# Patient Record
Sex: Female | Born: 1935
Health system: Southern US, Community
[De-identification: ages and names within clinical notes are randomized; demographics above are authoritative.]

## PROBLEM LIST (undated history)

## (undated) DIAGNOSIS — R143 Flatulence: Secondary | ICD-10-CM

## (undated) DIAGNOSIS — R142 Eructation: Secondary | ICD-10-CM

## (undated) DIAGNOSIS — S43005A Unspecified dislocation of left shoulder joint, initial encounter: Secondary | ICD-10-CM

## (undated) DIAGNOSIS — K649 Unspecified hemorrhoids: Secondary | ICD-10-CM

## (undated) DIAGNOSIS — K921 Melena: Secondary | ICD-10-CM

## (undated) DIAGNOSIS — R141 Gas pain: Secondary | ICD-10-CM

## (undated) DIAGNOSIS — F0390 Unspecified dementia without behavioral disturbance: Secondary | ICD-10-CM

## (undated) DIAGNOSIS — M1990 Unspecified osteoarthritis, unspecified site: Secondary | ICD-10-CM

## (undated) DIAGNOSIS — K219 Gastro-esophageal reflux disease without esophagitis: Secondary | ICD-10-CM

## (undated) HISTORY — DX: Unspecified dementia, unspecified severity, without behavioral disturbance, psychotic disturbance, mood disturbance, and anxiety: F03.90

## (undated) HISTORY — DX: Gas pain: R14.2

## (undated) HISTORY — DX: Gastro-esophageal reflux disease without esophagitis: K21.9

## (undated) HISTORY — DX: Unspecified osteoarthritis, unspecified site: M19.90

## (undated) HISTORY — DX: Unspecified hemorrhoids: K64.9

## (undated) HISTORY — DX: Gas pain: R14.3

## (undated) HISTORY — DX: Unspecified dislocation of left shoulder joint, initial encounter: S43.005A

## (undated) HISTORY — DX: Melena: K92.1

## (undated) HISTORY — DX: Flatulence: R14.1

## (undated) HISTORY — PX: ABDOMINAL HYSTERECTOMY: SUR658

---

## 2000-04-07 ENCOUNTER — Other Ambulatory Visit: Admission: RE | Admit: 2000-04-07 | Discharge: 2000-04-07 | Payer: Self-pay | Admitting: Emergency Medicine

## 2000-04-11 ENCOUNTER — Encounter: Payer: Self-pay | Admitting: Emergency Medicine

## 2000-04-11 ENCOUNTER — Encounter: Admission: RE | Admit: 2000-04-11 | Discharge: 2000-04-11 | Payer: Self-pay | Admitting: Emergency Medicine

## 2000-12-13 ENCOUNTER — Encounter: Admission: RE | Admit: 2000-12-13 | Discharge: 2000-12-13 | Payer: Self-pay | Admitting: Emergency Medicine

## 2000-12-13 ENCOUNTER — Encounter: Payer: Self-pay | Admitting: Emergency Medicine

## 2001-03-06 ENCOUNTER — Emergency Department (HOSPITAL_COMMUNITY): Admission: EM | Admit: 2001-03-06 | Discharge: 2001-03-06 | Payer: Self-pay | Admitting: Emergency Medicine

## 2001-03-27 ENCOUNTER — Inpatient Hospital Stay (HOSPITAL_COMMUNITY): Admission: AD | Admit: 2001-03-27 | Discharge: 2001-03-27 | Payer: Self-pay | Admitting: *Deleted

## 2001-04-12 ENCOUNTER — Encounter: Payer: Self-pay | Admitting: Emergency Medicine

## 2001-04-12 ENCOUNTER — Encounter: Admission: RE | Admit: 2001-04-12 | Discharge: 2001-04-12 | Payer: Self-pay | Admitting: Emergency Medicine

## 2002-10-11 ENCOUNTER — Encounter: Admission: RE | Admit: 2002-10-11 | Discharge: 2002-10-11 | Payer: Self-pay | Admitting: Emergency Medicine

## 2002-10-11 ENCOUNTER — Encounter: Payer: Self-pay | Admitting: Emergency Medicine

## 2009-10-12 ENCOUNTER — Encounter: Admission: RE | Admit: 2009-10-12 | Discharge: 2009-10-12 | Payer: Self-pay | Admitting: Obstetrics and Gynecology

## 2009-12-22 LAB — HM PAP SMEAR: HM Pap smear: NORMAL

## 2009-12-22 LAB — CONVERTED CEMR LAB: Pap Smear: NORMAL

## 2010-07-26 ENCOUNTER — Ambulatory Visit: Payer: Self-pay | Admitting: Internal Medicine

## 2010-07-26 ENCOUNTER — Encounter: Payer: Self-pay | Admitting: Gastroenterology

## 2010-07-26 DIAGNOSIS — K921 Melena: Secondary | ICD-10-CM | POA: Insufficient documentation

## 2010-07-26 DIAGNOSIS — K219 Gastro-esophageal reflux disease without esophagitis: Secondary | ICD-10-CM | POA: Insufficient documentation

## 2010-07-26 LAB — CONVERTED CEMR LAB
Basophils Absolute: 0 10*3/uL (ref 0.0–0.1)
CO2: 30 meq/L (ref 19–32)
Calcium: 9.4 mg/dL (ref 8.4–10.5)
Creatinine, Ser: 0.8 mg/dL (ref 0.4–1.2)
Eosinophils Absolute: 0.1 10*3/uL (ref 0.0–0.7)
GFR calc non Af Amer: 89.07 mL/min (ref >60)
Glucose, Bld: 80 mg/dL (ref 70–99)
Hemoglobin: 13.6 g/dL (ref 12.0–15.0)
Lymphocytes Relative: 40.8 % (ref 12.0–46.0)
MCHC: 35.3 g/dL (ref 30.0–36.0)
Monocytes Relative: 8.8 % (ref 3.0–12.0)
Neutro Abs: 2.1 10*3/uL (ref 1.4–7.7)
Neutrophils Relative %: 48.1 % (ref 43.0–77.0)
RBC: 4.45 M/uL (ref 3.87–5.11)
RDW: 13.9 % (ref 11.5–14.6)
Sodium: 140 meq/L (ref 135–145)

## 2010-07-27 ENCOUNTER — Encounter: Payer: Self-pay | Admitting: Internal Medicine

## 2010-08-24 ENCOUNTER — Ambulatory Visit: Payer: Self-pay | Admitting: Gastroenterology

## 2010-08-29 ENCOUNTER — Encounter: Payer: Self-pay | Admitting: Internal Medicine

## 2010-08-30 ENCOUNTER — Telehealth: Payer: Self-pay | Admitting: Internal Medicine

## 2010-09-21 ENCOUNTER — Encounter (INDEPENDENT_AMBULATORY_CARE_PROVIDER_SITE_OTHER): Payer: Self-pay | Admitting: *Deleted

## 2010-09-21 ENCOUNTER — Ambulatory Visit: Payer: Self-pay | Admitting: Internal Medicine

## 2010-10-26 ENCOUNTER — Ambulatory Visit: Payer: Self-pay | Admitting: Gastroenterology

## 2010-10-26 ENCOUNTER — Encounter (INDEPENDENT_AMBULATORY_CARE_PROVIDER_SITE_OTHER): Payer: Self-pay | Admitting: *Deleted

## 2010-10-26 DIAGNOSIS — R143 Flatulence: Secondary | ICD-10-CM

## 2010-10-26 DIAGNOSIS — R142 Eructation: Secondary | ICD-10-CM

## 2010-10-26 DIAGNOSIS — K625 Hemorrhage of anus and rectum: Secondary | ICD-10-CM | POA: Insufficient documentation

## 2010-10-26 DIAGNOSIS — K649 Unspecified hemorrhoids: Secondary | ICD-10-CM | POA: Insufficient documentation

## 2010-10-26 DIAGNOSIS — R141 Gas pain: Secondary | ICD-10-CM | POA: Insufficient documentation

## 2010-10-26 DIAGNOSIS — M1990 Unspecified osteoarthritis, unspecified site: Secondary | ICD-10-CM | POA: Insufficient documentation

## 2010-11-11 HISTORY — PX: COLONOSCOPY: SHX174

## 2010-11-11 HISTORY — PX: UPPER GI ENDOSCOPY: SHX6162

## 2010-11-22 ENCOUNTER — Ambulatory Visit: Payer: Self-pay | Admitting: Gastroenterology

## 2010-11-22 LAB — CONVERTED CEMR LAB: UREASE: NEGATIVE

## 2011-01-02 ENCOUNTER — Encounter: Payer: Self-pay | Admitting: Emergency Medicine

## 2011-01-02 ENCOUNTER — Encounter: Payer: Self-pay | Admitting: Internal Medicine

## 2011-01-11 NOTE — Letter (Signed)
Summary: Results Follow-up Letter  [facility]  [address] [place], [place] [address]   Phone: [phone]  Fax: [phone]    07/27/2010  [address] [place], [place]  [address]  Dear Ms. Kracht,   The following are the results of your recent test(s):  Test     Result     hand xray     arthritis CBC       normal Kidney     normal   _________________________________________________________  Please call for an appointment in 3-4 weeks _________________________________________________________ _________________________________________________________ _________________________________________________________  Sincerely,  Sanda Linger MD [facility]

## 2011-01-11 NOTE — Miscellaneous (Signed)
Summary: Flu Vaccination/[facility]  Flu Vaccination/[facility]   Imported By: Sherian Rein 09/01/2010 08:02:41  _____________________________________________________________________  External Attachment:    Type:   Image     Comment:   External Document

## 2011-01-11 NOTE — Letter (Signed)
Summary: [facility] Instructions  [facility]  [address] [place], [place] [address]   Phone: [phone]  Fax: [phone]       Katelyn Sparks    1935-11-09    MRN: [id number]        Procedure Day /Date: 11/22/2010 Monday     Arrival Time: 1:30pm     Procedure Time: 2:30pm     Location of Procedure:                    X  [facility] (4th Floor)   PREPARATION FOR COLONOSCOPY WITH MOVIPREP   Starting 5 days prior to your procedure 11/17/2010 do not eat nuts, seeds, popcorn, corn, beans, peas,  salads, or any raw vegetables.  Do not take any fiber supplements (e.g. Metamucil, Citrucel, and Benefiber).  THE DAY BEFORE YOUR PROCEDURE         DATE: 11/21/2010  DAY: Sunday  1.  Drink clear liquids the entire day-NO SOLID FOOD  2.  Do not drink anything colored red or purple.  Avoid juices with pulp.  No orange juice.  3.  Drink at least 64 oz. (8 glasses) of fluid/clear liquids during the day to prevent dehydration and help the prep work efficiently.  CLEAR LIQUIDS INCLUDE: Water Jello Ice Popsicles Tea (sugar ok, no milk/cream) Powdered fruit flavored drinks Coffee (sugar ok, no milk/cream) Gatorade Juice: apple, white grape, white cranberry  Lemonade Clear bullion, consomm, broth Carbonated beverages (any kind) Strained chicken noodle soup Hard Candy                             4.  In the morning, mix first dose of MoviPrep solution:    Empty 1 Pouch A and 1 Pouch B into the disposable container    Add lukewarm drinking water to the top line of the container. Mix to dissolve    Refrigerate (mixed solution should be used within 24 hrs)  5.  Begin drinking the prep at 5:00 p.m. The MoviPrep container is divided by 4 marks.   Every 15 minutes drink the solution down to the next mark (approximately 8 oz) until the full liter is complete.   6.  Follow completed prep with 16 oz of clear liquid of your choice (Nothing red or purple).   Continue to drink clear liquids until bedtime.  7.  Before going to bed, mix second dose of MoviPrep solution:    Empty 1 Pouch A and 1 Pouch B into the disposable container    Add lukewarm drinking water to the top line of the container. Mix to dissolve    Refrigerate  THE DAY OF YOUR PROCEDURE      DATE: 11/22/2010   DAY: Monday  Beginning at 9:30am (5 hours before procedure):         1. Every 15 minutes, drink the solution down to the next mark (approx 8 oz) until the full liter is complete.  2. Follow completed prep with 16 oz. of clear liquid of your choice.    3. You may drink clear liquids until 12:30pm (2 HOURS BEFORE PROCEDURE).   MEDICATION INSTRUCTIONS  Unless otherwise instructed, you should take regular prescription medications with a small sip of water   as early as possible the morning of your procedure.         OTHER INSTRUCTIONS  You will need a responsible adult at least 74 years of  age to accompany you and drive you home.   This person must remain in the waiting room during your procedure.  Wear loose fitting clothing that is easily removed.  Leave jewelry and other valuables at home.  However, you may wish to bring a book to read or  an iPod/MP3 player to listen to music as you wait for your procedure to start.  Remove all body piercing jewelry and leave at home.  Total time from sign-in until discharge is approximately 2-3 hours.  You should go home directly after your procedure and rest.  You can resume normal activities the  day after your procedure.  The day of your procedure you should not:   Drive   Make legal decisions   Operate machinery   Drink alcohol   Return to work  You will receive specific instructions about eating, activities and medications before you leave.    The above instructions have been reviewed and explained to me by   _______________________    I fully understand and can verbalize these instructions  _____________________________ Date _________

## 2011-01-11 NOTE — Assessment & Plan Note (Signed)
Summary: 1 mos f/u #/cd   Vital Signs:  Patient profile:   76 year old female Menstrual status:  hysterectomy Height:      666 inches Weight:      143 pounds O2 Sat:      98 % on Room air Temp:     97 degrees F oral Pulse rate:   65 / minute Pulse rhythm:   regular Resp:     16 per minute BP sitting:   140 / 78  (left arm) Cuff size:   regular  Vitals Entered By: Jarome Lamas (September 21, 2010 9:35 AM)  O2 Flow:  Room air CC: 1 month fl/up/pb, Abdominal Pain     Menstrual Status hysterectomy Last PAP Result Normal   Primary Care Provider:  Etta Grandchild MD  CC:  1 month fl/up/pb and Abdominal Pain.  History of Present Illness: She returns for f/up and she tells me that her heartburn is much better. She takes Aciphex on 1/2 of her days.  Dyspepsia History:      She has no alarm features of dyspepsia including no history of melena, hematochezia, dysphagia, persistent vomiting, or involuntary weight loss > 5%.  There is a prior history of GERD.  The patient does not have a prior history of documented ulcer disease.  The dominant symptom is heartburn or acid reflux.  An H-2 blocker medication is currently being taken.  She notes that the symptoms have improved with the H-2 blocker therapy.  Symptoms have not persisted after 4 weeks of H-2 blocker treatment.  No previous upper endoscopy has been done.     Current Medications (verified): 1)  Aciphex 20 Mg Tbec (Rabeprazole Sodium) .... One By Mouth Once Daily As Needed For Heartburn  Allergies (verified): No Known Drug Allergies  Past History:  Past Medical History: Last updated: 07/26/2010 Unremarkable  Past Surgical History: Last updated: 07/26/2010 Hysterectomy  Family History: Last updated: 07/26/2010 Family History of Arthritis Family History Diabetes 1st degree relative Family History Hypertension  Social History: Last updated: 07/26/2010 Retired Widow/Widower Never Smoked Alcohol use-no Drug  use-no Regular exercise-yes  Risk Factors: Alcohol Use: 0 (07/26/2010) Exercise: yes (07/26/2010)  Risk Factors: Smoking Status: never (07/26/2010)  Family History: Reviewed history from 07/26/2010 and no changes required. Family History of Arthritis Family History Diabetes 1st degree relative Family History Hypertension  Social History: Reviewed history from 07/26/2010 and no changes required. 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    Physical Exam  General:  alert, well-developed, well-nourished, well-hydrated, appropriate dress, normal appearance, healthy-appearing, and cooperative to examination.   Mouth:  Oral mucosa and oropharynx without lesions or exudates.  Teeth in good repair. Neck:  supple, full ROM, no masses, no thyromegaly, no thyroid nodules or tenderness, no JVD, normal carotid upstroke, no carotid bruits, no cervical lymphadenopathy, and no neck tenderness.   Lungs:  normal respiratory effort, no intercostal retractions, no accessory muscle use, normal breath sounds, no dullness, no fremitus, no crackles, and no wheezes.   Heart:  normal rate, regular rhythm, no murmur, no gallop, no rub, and no JVD.   Abdomen:  soft, non-tender, normal bowel sounds, no distention, no masses, no guarding, no rigidity, no rebound tenderness, no abdominal hernia, no inguinal hernia, no  hepatomegaly, and no splenomegaly.   Msk:  normal ROM, no joint tenderness, no joint swelling, no joint warmth, no redness over joints, no joint deformities, no joint instability, no crepitation, and no muscle atrophy.   Pulses:  R and L  carotid,radial,femoral,dorsalis pedis and posterior tibial pulses are full and equal bilaterally Extremities:  No clubbing, cyanosis, edema, or deformity noted with normal full range of motion of all joints.   Neurologic:  No cranial nerve deficits noted. Station and gait are normal. Plantar reflexes are down-going bilaterally. DTRs are symmetrical throughout. Sensory, motor and coordinative functions appear intact. Skin:  turgor normal, color normal, no rashes, no suspicious lesions, no ecchymoses, no petechiae, no purpura, no ulcerations, and no edema.   Cervical Nodes:  no anterior cervical adenopathy and no posterior cervical adenopathy.   Psych:  Cognition and judgment appear intact. Alert and cooperative with normal attention span and concentration. No apparent delusions, illusions, hallucinations   Impression & Recommendations:  Problem # 1:  GERD (ICD-530.81) Assessment Improved  Her updated medication list for this problem includes:    Aciphex 20 Mg Tbec (Rabeprazole sodium) ..... One by mouth once daily as needed for heartburn  Orders: Gastroenterology Referral (GI)  Labs Reviewed: Hgb: 13.6 (07/26/2010)   Hct: 38.5 (07/26/2010)  Problem # 2:  BLOOD IN STOOL (ICD-578.1) Assessment: Unchanged  Orders: Gastroenterology Referral (GI)  Complete Medication List: 1)  Aciphex 20 Mg Tbec (Rabeprazole sodium) .... One by mouth once daily as needed for heartburn  Colorectal Screening:  Current Recommendations:    Colonoscopy recommended: scheduled with G.I.  PAP Screening:    Hx Cervical Dysplasia in last 5 yrs? No    3 normal PAP smears in last 5 yrs? Yes    Last PAP smear:  12/22/2009    Reviewed PAP smear recommendations:  patient defers to GYN provider  Mammogram Screening:    Last Mammogram:  12/12/2009    Reviewed Mammogram recommendations:  mammogram ordered  Osteoporosis Risk Assessment:  Risk Factors for Fracture or Low Bone Density:   Smoking status:        never  Immunization & Chemoprophylaxis:    Influenza vaccine: Fluvax MCR  (08/29/2010)  Patient Instructions: 1)  Please schedule a follow-up appointment in 4 months. 2)  Avoid foods high in acid (tomatoes, citrus juices, spicy foods). Avoid eating within two hours of lying down or before exercising. Do not over eat; try smaller more frequent meals. Elevate head of bed twelve inches when sleeping. 3)  Schedule your mammogram. 4)  Schedule a colonoscopy/sigmoidoscopy to help detect colon cancer. 5)  You need to have a Pap Smear to prevent cervical cancer. Prescriptions: ACIPHEX 20 MG TBEC (RABEPRAZOLE SODIUM) one by mouth once daily as needed for heartburn  #30 x 11   Entered and Authorized by:   Etta Grandchild MD   Signed by:   Etta Grandchild MD on 09/21/2010   Method used:   Electronically to        [facility]* (retail)       [address]       [address] Shipping/mailing       [place], [place]  [address]       Ph: [phone]       Fax: [phone]   RxID:   [id number]

## 2011-01-11 NOTE — Assessment & Plan Note (Signed)
Summary: gerd...as.   History of Present Illness Visit Type: Initial Consult Primary GI MD: Sheryn Bison MD FACP FAGA Primary Provider: Etta Grandchild MD Requesting Provider: Etta Grandchild MD Chief Complaint: BRB in stool after BMs, constipation, hemorrhoids, bloating, chest pain, and GERD  History of Present Illness:   76 year old [place] American female referred by Dr.Jones for evaluation of years of intermittent rectal bleeding, chronic gastroesophageal reflux disease, and recurrent nocturnal chest pain.  This lady has not had previous endoscopy or colonoscopy. She denies abdominal pain, but has rather classic acid reflux symptoms alleviated by taking p.r.n. AcipHex. She has almost daily rectal bleeding and has felt large external hemorrhoids. Recent laboratory data showed a normal CBC a metabolic profile. She denies any food intolerances, anorexia or weight loss. She does not abuse alcohol, cigarettes, or NSAIDs. She has had previous hysterectomy.   GI Review of Systems    Reports acid reflux, bloating, chest pain, and  heartburn.      Denies abdominal pain, belching, dysphagia with liquids, dysphagia with solids, loss of appetite, nausea, vomiting, vomiting blood, weight loss, and  weight gain.      Reports constipation, hemorrhoids, and  rectal bleeding.     Denies anal fissure, black tarry stools, change in bowel habit, diarrhea, diverticulosis, fecal incontinence, heme positive stool, irritable bowel syndrome, jaundice, light color stool, liver problems, and  rectal pain.    Current Medications (verified): 1)  Aciphex 20 Mg Tbec (Rabeprazole Sodium) .... One By Mouth Once Daily As Needed For Heartburn 2)  Alendronate Sodium 70 Mg Tabs (Alendronate Sodium) .... One Tablet By Mouth Once A Week 3)  Caltrate 600 1500 Mg Tabs (Calcium Carbonate) .... One Tablet By Mouth Once Daily  Allergies (verified): No Known Drug Allergies  Past History:  Past medical, surgical, family  and social histories (including risk factors) reviewed for relevance to current acute and chronic problems.  Past Medical History: OSTEOARTHRITIS (ICD-715.90) HEMORRHOIDS (ICD-455.6) ABDOMINAL BLOATING (ICD-787.3) FAMILY HISTORY DIABETES 1ST DEGREE RELATIVE (ICD-V18.0) GERD (ICD-530.81) BLOOD IN STOOL (ICD-578.1)  Past Surgical History: Reviewed history from 07/26/2010 and no changes required. Hysterectomy  Family History: Reviewed history from 07/26/2010 and no changes required. Family History of Arthritis Family History Diabetes 1st degree relative Family History Hypertension No FH of Colon Cancer:  Social History: Reviewed history from 07/26/2010 and no changes required. 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  The patient denies allergy/sinus, anemia, anxiety-new, back pain, blood in urine, breast changes/lumps, change in vision, confusion, cough, coughing up blood, depression-new, fainting, fatigue, fever, headaches-new, hearing problems, heart murmur, heart rhythm changes, itching, menstrual pain, muscle pains/cramps, night sweats, nosebleeds, pregnancy symptoms, shortness of breath, skin rash, sore throat, swelling of feet/legs, swollen lymph glands, thirst - excessive , urination - excessive , urination changes/pain, urine leakage, vision changes, and voice change.    Vital Signs:  Patient profile:   76 year old female Menstrual status:  hysterectomy Height:      64 inches Weight:      138 pounds BMI:     23.77 BSA:     1.67 Pulse rate:   88 / minute Pulse rhythm:   regular BP sitting:   132 / 64  (left arm) Cuff size:   regular  Vitals Entered By: Ok Anis CMA (October 26, 2010 1:17 PM)  Physical Exam  General:  Well developed,  well nourished, no acute distress. Head:  Normocephalic and atraumatic. Eyes:  PERRLA, no  icterus.exam deferred to patient's ophthalmologist.   Lungs:  Clear throughout to auscultation.decreased BS on R.   Heart:  Regular rate and rhythm; no murmurs, rubs,  or bruits. Abdomen:  Soft, nontender and nondistended. No masses, hepatosplenomegaly or hernias noted. Normal bowel sounds. Rectal:  Large tags and external hemorrhoids noted without rectal masses or tenderness. Stool is guaiac negative. Msk:  Symmetrical with no gross deformities. Normal posture. Extremities:  No clubbing, cyanosis, edema or deformities noted. Neurologic:  Alert and  oriented x4;  grossly normal neurologically. Cervical Nodes:  No significant cervical adenopathy. Psych:  Alert and cooperative. Normal mood and affect.   Impression & Recommendations:  Problem # 1:  EXTERNAL HEMORRHOIDS (ICD-455.3) Assessment Unchanged p.r.n. local Analpram cream. Information concerning hemorrhoids/their management has been given to the patient. Orders: Colon/Endo (Colon/Endo)  Problem # 2:  RECTAL BLEEDING (ICD-569.3) Assessment: Deteriorated Probable hemorrhoidal bleeding-rule out colonic polyposis. Colonoscopy has been scheduled her convenience. Orders: Colon/Endo (Colon/Endo)  Problem # 3:  GERD (ICD-530.81) Assessment: Improved This Patient is somewhat stoic, but apparently has had acid reflux for several years, recently improved on a brief course of AcipHex therapy. I have employed her to take this regular before bed, and she watched the video on acid reflux in his management. Endoscopy with biopsies to exclude Barrett's mucosa also has been scheduled. Colon/Endo (Colon/Endo)  Problem # 4:  OSTEOARTHRITIS (ICD-715.90) Assessment: Comment Only  Problem # 5:  BLOOD IN STOOL (ICD-578.1) Assessment: Unchanged Stool today to my exam was guaiac-negative.  Patient Instructions: 1)  Copy sent to : Etta Grandchild MD 2)  Your prescription(s) have been sent to you pharmacy.  3)  Your procedure has been scheduled for  11/22/2010, please follow the seperate instructions.  4)  Take your Aciphex everynight before bedtime.  5)  [facility] Patient Information Guide given to patient.  6)  GI Reflux brochure given.  7)  Hemorrhoids brochure given.  8)  Upper Endoscopy brochure given.  9)  You have watched the movie on Acid reflux in the office today.  10)  The medication list was reviewed and reconciled.  All changed / newly prescribed medications were explained.  A complete medication list was provided to the patient / caregiver. Prescriptions: ANALPRAM-HC 1-2.5 % CREA (HYDROCORTISONE ACE-PRAMOXINE) use per rectum two times a day  #1 tube x 1   Entered by:   Harlow Mares CMA (AAMA)   Authorized by:   Mardella Layman MD [facility]   Signed by:   Harlow Mares CMA (AAMA) on 10/26/2010   Method used:   Faxed to ...       [facility] (retail)       [address] [place]       [address]       [place], [place]  [address]       Ph: [phone]       Fax: [phone]   RxID:   [id number] MOVIPREP 100 GM  SOLR (PEG-KCL-NACL-NASULF-NA ASC-C) As per prep instructions.  #1 x 0   Entered by:   Harlow Mares CMA (AAMA)   Authorized by:   Mardella Layman MD [facility]   Signed by:   Harlow Mares CMA (AAMA) on 10/26/2010   Method used:   Faxed to ...       [facility] (retail)       [address]  [address]       [place], [place]  [address]       Ph: [phone]       Fax: [phone]   RxID:   [id number]

## 2011-01-11 NOTE — Letter (Signed)
Summary: New Patient letter  [facility]  [address] [place], [place] [address]   Phone: [phone]  Fax: [phone]       09/21/2010 MRN: [id number]  Katelyn Sparks [address] [place], [place]  [address]  Dear Ms. Mars,  Welcome to the Gastroenterology Division at [facility].    You are scheduled to see Dr. Jarold Motto on 10/26/2010 at 1:30PM on the 3rd floor at [facility], [address]  We ask that you try to arrive at our office 15 minutes prior to your appointment time to allow for check-in.  We would like you to complete the enclosed self-administered evaluation form prior to your visit and bring it with you on the day of your appointment.  We will review it with you.  Also, please bring a complete list of all your medications or, if you prefer, bring the medication bottles and we will list them.  Please bring your insurance card so that we may make a copy of it.  If your insurance requires a referral to see a specialist, please bring your referral form from your primary care physician.  Co-payments are due at the time of your visit and may be paid by cash, check or credit card.     Your office visit will consist of a consult with your physician (includes a physical exam), any laboratory testing he/she may order, scheduling of any necessary diagnostic testing (e.g. x-ray, ultrasound, CT-scan), and scheduling of a procedure (e.g. Endoscopy, Colonoscopy) if required.  Please allow enough time on your schedule to allow for any/all of these possibilities.    If you cannot keep your appointment, please call [phone] to cancel or reschedule prior to your appointment date.  This allows [place] the opportunity to schedule an appointment for another patient in need of care.  If you do not cancel or reschedule by 5 p.m. the business day prior to your appointment date, you will be charged a $50.00 late cancellation/no-show fee.    Thank you for choosing  [facility] for your medical needs.  We appreciate the opportunity to care for you.  Please visit [place] at our website  to learn more about our practice.                     Sincerely,                                                             The Gastroenterology Division

## 2011-01-11 NOTE — Assessment & Plan Note (Signed)
Summary: NEW / MEDICARE / # CD   Vital Signs:  Patient profile:   76 year old female Height:      666 inches Weight:      146 pounds BMI:     0.23 O2 Sat:      94 % on Room air Temp:     97.6 degrees F oral Pulse rate:   74 / minute Pulse rhythm:   regular Resp:     16 per minute BP sitting:   116 / 80  (left arm) Cuff size:   large  Vitals Entered By: Rock Nephew CMA (July 26, 2010 1:11 PM)  O2 Flow:  Room air CC: new pt to establish, Preventive Care, Abdominal Pain Is Patient Diabetic? No Pain Assessment Patient in pain? no       Does patient need assistance? Functional Status Self care Ambulation Normal   CC:  new pt to establish, Preventive Care, and Abdominal Pain.  History of Present Illness: New to me she complains of left hand pain in the MCP joints for a long time. No hx. of trauma or injury.  Also, she has had a couple of episodes of painless blood in her stools- she has never had a colonoscopy.  Dyspepsia History:      She has no alarm features of dyspepsia including no history of melena, hematochezia, dysphagia, persistent vomiting, or involuntary weight loss > 5%.  There is a prior history of GERD.  The dominant symptom is heartburn or acid reflux.  An H-2 blocker medication is not currently being taken.     Preventive Screening-Counseling & Management  Alcohol-Tobacco     Alcohol drinks/day: 0     Smoking Status: never  Caffeine-Diet-Exercise     Does Patient Exercise: yes  Hep-HIV-STD-Contraception     Hepatitis Risk: no risk noted     HIV Risk: no risk noted     STD Risk: no risk noted      Sexual History:  not active.        Drug Use:  no.        Blood Transfusions:  no.    Medications Prior to Update: 1)  None  Current Medications (verified): 1)  Aciphex 20 Mg Tbec (Rabeprazole Sodium) .... One By Mouth Once Daily As Needed For Heartburn  Allergies (verified): No Known Drug Allergies  Past History:  Past Medical  History: Unremarkable  Past Surgical History: Hysterectomy  Family History: Family History of Arthritis Family History Diabetes 1st degree relative Family History Hypertension  Social History: Retired Conservation officer, nature Never Smoked Alcohol use-no Drug use-no Regular exercise-yes Smoking Status:  never Hepatitis Risk:  no risk noted HIV Risk:  no risk noted STD Risk:  no risk noted Sexual History:  not active Blood Transfusions:  no Drug Use:  no Does Patient Exercise:  yes  Review of Systems       The patient complains of hematochezia.  The patient denies anorexia, fever, weight loss, weight gain, chest pain, syncope, dyspnea on exertion, peripheral edema, prolonged cough, headaches, hemoptysis, abdominal pain, melena, and severe indigestion/heartburn.   GI:  Complains of bloody stools, constipation, and indigestion; denies abdominal pain, change in bowel habits, dark tarry stools, diarrhea, excessive appetite, hemorrhoids, loss of appetite, nausea, vomiting, vomiting blood, and yellowish skin color. MS:  Complains of joint pain; denies joint redness, joint swelling, loss of strength, low back pain, mid back pain, muscle aches, muscle, cramps, stiffness, and thoracic pain.  Physical Exam  General:  alert, well-developed, well-nourished, well-hydrated, appropriate dress, normal appearance, healthy-appearing, and cooperative to examination.   Head:  normocephalic, atraumatic, no abnormalities observed, and no abnormalities palpated.   Eyes:  vision grossly intact, pupils equal, pupils round, and pupils reactive to light.   Mouth:  Oral mucosa and oropharynx without lesions or exudates.  Teeth in good repair. Neck:  supple, full ROM, no masses, no thyromegaly, no thyroid nodules or tenderness, no JVD, normal carotid upstroke, no carotid bruits, no cervical lymphadenopathy, and no neck tenderness.   Lungs:  normal respiratory effort, no intercostal retractions, no accessory muscle  use, normal breath sounds, no dullness, no fremitus, no crackles, and no wheezes.   Heart:  normal rate, regular rhythm, no murmur, no gallop, no rub, and no JVD.   Abdomen:  soft, non-tender, normal bowel sounds, no distention, no masses, no guarding, no rigidity, no rebound tenderness, no abdominal hernia, no inguinal hernia, no hepatomegaly, and no splenomegaly.   Rectal:  no external abnormalities, normal sphincter tone, no masses, no tenderness, no fissures, no fistulae, no perianal rash, and external hemorrhoid(s).   Msk:  normal ROM, no joint tenderness, no joint swelling, no joint warmth, no redness over joints, no joint deformities, no joint instability, no crepitation, and no muscle atrophy.   Pulses:  R and L carotid,radial,femoral,dorsalis pedis and posterior tibial pulses are full and equal bilaterally Extremities:  No clubbing, cyanosis, edema, or deformity noted with normal full range of motion of all joints.   Neurologic:  No cranial nerve deficits noted. Station and gait are normal. Plantar reflexes are down-going bilaterally. DTRs are symmetrical throughout. Sensory, motor and coordinative functions appear intact. Skin:  turgor normal, color normal, no rashes, no suspicious lesions, no ecchymoses, no petechiae, no purpura, no ulcerations, and no edema.   Cervical Nodes:  no anterior cervical adenopathy and no posterior cervical adenopathy.   Axillary Nodes:  no R axillary adenopathy and no L axillary adenopathy.   Inguinal Nodes:  no R inguinal adenopathy and no L inguinal adenopathy.   Psych:  Cognition and judgment appear intact. Alert and cooperative with normal attention span and concentration. No apparent delusions, illusions, hallucinations   Impression & Recommendations:  Problem # 1:  GERD (ICD-530.81) Assessment New  Her updated medication list for this problem includes:    Aciphex 20 Mg Tbec (Rabeprazole sodium) ..... One by mouth once daily as needed for  heartburn  Orders: Venipuncture ([address]) TLB-BMP (Basic Metabolic Panel-BMET) (80048-METABOL) TLB-CBC Platelet - w/Differential (85025-CBCD) TLB-Sedimentation Rate (ESR) (85652-ESR)  Problem # 2:  BLOOD IN STOOL (ICD-578.1)  Orders: Venipuncture (60454) TLB-BMP (Basic Metabolic Panel-BMET) (80048-METABOL) TLB-CBC Platelet - w/Differential (85025-CBCD) TLB-Sedimentation Rate (ESR) (85652-ESR) Gastroenterology Referral (GI) Hemoccult Guaiac-1 spec.(in office) (82270)  Problem # 3:  HAND PAIN, LEFT (ICD-729.5) Assessment: New  Orders: T-Hand Left 3 Views (73130TC) Venipuncture (09811) TLB-BMP (Basic Metabolic Panel-BMET) (80048-METABOL) TLB-CBC Platelet - w/Differential (85025-CBCD) TLB-Sedimentation Rate (ESR) (85652-ESR)  Complete Medication List: 1)  Aciphex 20 Mg Tbec (Rabeprazole sodium) .... One by mouth once daily as needed for heartburn  Colorectal Screening:  Current Recommendations:    Hemoccult: NEG X 1 today    Colonoscopy recommended: scheduled with G.I.  PAP Screening:    Hx Cervical Dysplasia in last 5 yrs? No    3 normal PAP smears in last 5 yrs? Yes    Last PAP smear:  12/22/2009  PAP Smear Results:    Date of Exam:  12/22/2009    Results:  Normal  Mammogram Screening:  Last Mammogram:  12/12/2009  Osteoporosis Risk Assessment:  Risk Factors for Fracture or Low Bone Density:   Smoking status:       never  Patient Instructions: 1)  Please schedule a follow-up appointment in 1 month. 2)  Avoid foods high in acid (tomatoes, citrus juices, spicy foods). Avoid eating within two hours of lying down or before exercising. Do not over eat; try smaller more frequent meals. Elevate head of bed twelve inches when sleeping. 3)  Schedule a colonoscopy/sigmoidoscopy to help detect colon cancer. 4)  Take 650-1000mg  of Tylenol every 4-6 hours as needed for relief of pain or comfort of fever AVOID taking more than 4000mg   in a 24 hour period (can cause liver  damage in higher doses). Prescriptions: ACIPHEX 20 MG TBEC (RABEPRAZOLE SODIUM) one by mouth once daily as needed for heartburn  #30 x 0   Entered and Authorized by:   Etta Grandchild MD   Signed by:   Etta Grandchild MD on 07/26/2010   Method used:   Samples Given   RxID:   [id number]   Preventive Care Screening  Mammogram:    Date:  12/12/2009    Results:  normal

## 2011-01-11 NOTE — Progress Notes (Signed)
    Influenza Immunization History:    Influenza # 1:  Fluvax MCR (08/29/2010)

## 2011-01-11 NOTE — Letter (Signed)
Summary: New Patient letter  [facility]  [address] [place], [place] [address]   Phone: [phone]  Fax: [phone]       07/26/2010 MRN: [id number]  Katelyn Sparks [address] [place], [place]  [address]  Dear Katelyn Sparks,  Welcome to the Gastroenterology Division at [facility].    You are scheduled to see Dr.  Jarold Motto on 08-24-10 at  9am on the 3rd floor at [facility], [address]  We ask that you try to arrive at our office 15 minutes prior to your appointment time to allow for check-in.  We would like you to complete the enclosed self-administered evaluation form prior to your visit and bring it with you on the day of your appointment.  We will review it with you.  Also, please bring a complete list of all your medications or, if you prefer, bring the medication bottles and we will list them.  Please bring your insurance card so that we may make a copy of it.  If your insurance requires a referral to see a specialist, please bring your referral form from your primary care physician.  Co-payments are due at the time of your visit and may be paid by cash, check or credit card.     Your office visit will consist of a consult with your physician (includes a physical exam), any laboratory testing he/she may order, scheduling of any necessary diagnostic testing (e.g. x-ray, ultrasound, CT-scan), and scheduling of a procedure (e.g. Endoscopy, Colonoscopy) if required.  Please allow enough time on your schedule to allow for any/all of these possibilities.    If you cannot keep your appointment, please call [phone] to cancel or reschedule prior to your appointment date.  This allows [place] the opportunity to schedule an appointment for another patient in need of care.  If you do not cancel or reschedule by 5 p.m. the business day prior to your appointment date, you will be charged a $50.00 late cancellation/no-show fee.    Thank you for choosing  [facility] for your medical needs.  We appreciate the opportunity to care for you.  Please visit [place] at our website  to learn more about our practice.                     Sincerely,                                                             The Gastroenterology Division

## 2011-01-13 NOTE — Miscellaneous (Signed)
Summary: RX omeprazole 20mg  daily  Clinical Lists Changes  Medications: Added new medication of OMEPRAZOLE 20 MG  CPDR (OMEPRAZOLE) 1 each day 30 minutes before meal Observations: Added new observation of ALLERGY REV: Done (11/22/2010 15:18) Added new observation of NKA: T (11/22/2010 15:18)  Appended Document: RX omeprazole 20mg  daily    Clinical Lists Changes  Medications: Added new medication of OMEPRAZOLE 20 MG  CPDR (OMEPRAZOLE) 1 each day 30 minutes before meal - Signed Rx of OMEPRAZOLE 20 MG  CPDR (OMEPRAZOLE) 1 each day 30 minutes before meal;  #30 x 11;  Signed;  Entered by: Sherren Kerns RN;  Authorized by: Mardella Layman MD [facility];  Method used: Print then Give to Patient    Prescriptions: OMEPRAZOLE 20 MG  CPDR (OMEPRAZOLE) 1 each day 30 minutes before meal  #30 x 11   Entered by:   Sherren Kerns RN   Authorized by:   Mardella Layman MD [facility]   Signed by:   Sherren Kerns RN on 11/22/2010   Method used:   Print then Give to Patient   RxID:   [id number]

## 2011-01-13 NOTE — Procedures (Signed)
Summary: Upper Endoscopy  Patient: Katelyn Sparks Note: All result statuses are Final unless otherwise noted.  Tests: (1) Upper Endoscopy (EGD)   EGD Upper Endoscopy       DONE     [facility]     [address]     [place], [place]  [address]           ENDOSCOPY PROCEDURE REPORT           PATIENT:  [name], [name]  MR#:  [id number]     BIRTHDATE:  1935/10/09, 75 yrs. old  GENDER:  female           ENDOSCOPIST:  Vania Rea. Jarold Motto, MD, [facility]     Referred by:  Etta Grandchild, M.D.           PROCEDURE DATE:  11/22/2010     PROCEDURE:  EGD, diagnostic 43235     ASA CLASS:  Class II     INDICATIONS:  chest pain, GERD           MEDICATIONS:   There was residual sedation effect present from     prior procedure., Versed 1 mg IV     TOPICAL ANESTHETIC:  Exactacain Spray           DESCRIPTION OF PROCEDURE:   After the risks benefits and     alternatives of the procedure were thoroughly explained, informed     consent was obtained.  The LB [id number] endoscope was     introduced through the mouth and advanced to the second portion of     the duodenum, without limitations.  The instrument was slowly     withdrawn as the mucosa was fully examined.     <<PROCEDUREIMAGES>>           The upper, middle, and distal third of the esophagus were     carefully inspected and no abnormalities were noted. The z-line     was well seen at the GEJ. The endoscope was pushed into the fundus     which was normal including a retroflexed view. The antrum,gastric     body, first and second part of the duodenum were unremarkable. CLO     BX. DONE.    Retroflexed views revealed no abnormalities.    The     scope was then withdrawn from the patient and the procedure     completed.           COMPLICATIONS:  None           ENDOSCOPIC IMPRESSION:     1) Normal EGD     CHRONIC NONEROSIVE GERD.     RECOMMENDATIONS:     1) Anti-reflux regimen to be follow     2) continue PPI           REPEAT  EXAM:  No           ______________________________     Vania Rea. Jarold Motto, MD, [facility]           CC:           n.     eSIGNED:   Vania Rea. Patterson at 11/22/2010 03:13 PM           Wendee Copp, [id number]  Note: An exclamation mark (!) indicates a result that was not dispersed into the flowsheet. Document Creation Date: 11/22/2010 3:13 PM _______________________________________________________________________  (1) Order result status: Final Collection or observation date-time: 11/22/2010 15:05 Requested date-time:  Receipt date-time:  Reported date-time:  Referring Physician:   Ordering Physician: Sheryn Bison [id number]) Specimen Source:  Source: Launa Grill Order Number: [id number] Lab site:

## 2011-01-13 NOTE — Procedures (Signed)
Summary: Colonoscopy  Patient: Katelyn Sparks Note: All result statuses are Final unless otherwise noted.  Tests: (1) Colonoscopy (COL)   COL Colonoscopy           DONE     [facility]     [address]     [place], [place]  [address]           COLONOSCOPY PROCEDURE REPORT           PATIENT:  Katelyn Sparks, Katelyn Sparks  MR#:  [id number]     BIRTHDATE:  [birth year], 75 yrs. old  GENDER:  female     ENDOSCOPIST:  Vania Rea. Jarold Motto, MD, [facility]     REF. BY:  Etta Grandchild, M.D.     PROCEDURE DATE:  11/22/2010     PROCEDURE:  Average-risk screening colonoscopy     G0121     ASA CLASS:  Class II     INDICATIONS:  Routine Risk Screening, rectal bleeding     MEDICATIONS:   Fentanyl 25 mcg IV, Versed 4 mg IV           DESCRIPTION OF PROCEDURE:   After the risks benefits and     alternatives of the procedure were thoroughly explained, informed     consent was obtained.  Digital rectal exam was performed and     revealed perianal skin tags.   The LB 180AL [id number] endoscope was     introduced through the anus and advanced to the cecum, which was     identified by both the appendix and ileocecal valve, without     limitations.  The quality of the prep was excellent, using     MoviPrep.  The instrument was then slowly withdrawn as the colon     was fully examined.     <<PROCEDUREIMAGES>>           FINDINGS:  Internal and external hemorrhoids were found.  No     polyps or cancers were seen.  This was otherwise a normal     examination of the colon.   Retroflexed views in the rectum     revealed hemorrhoids.    The scope was then withdrawn from the     patient and the procedure completed.           COMPLICATIONS:  None     ENDOSCOPIC IMPRESSION:     1) Internal and external hemorrhoids     2) No polyps or cancers     3) Otherwise normal examination     4) Hemorrhoids     RECOMMENDATIONS:     1) Continue current colorectal screening recommendations for     "routine risk" patients with a  repeat colonoscopy in 10 years.     LOCAL ANAL CARE.?? SURGICAL REFERRAL.     REPEAT EXAM:  No           ______________________________     Vania Rea. Jarold Motto, MD, [facility]           CC:           n.     eSIGNED:   Vania Rea. Patterson at 11/22/2010 03:00 PM           Wendee Copp, [id number]  Note: An exclamation mark (!) indicates a result that was not dispersed into the flowsheet. Document Creation Date: 11/22/2010 3:00 PM _______________________________________________________________________  (1) Order result status: Final Collection or observation date-time: 11/22/2010 14:53 Requested date-time:  Receipt date-time:  Reported date-time:  Referring Physician:   Ordering Physician: Sheryn Bison [id number]) Specimen Source:  Source: Launa Grill Order Number: [id number] Lab site:   Appended Document: Colonoscopy    Clinical Lists Changes  Observations: Added new observation of COLONNXTDUE: 11/2020 (11/22/2010 15:34)      Appended Document: Colonoscopy Recall     Procedures Next Due Date:    Colonoscopy: 12/2020

## 2011-01-13 NOTE — Miscellaneous (Signed)
Summary: clotest  Clinical Lists Changes  Orders: Added new Test order of TLB-H Pylori Screen Gastric Biopsy (83013-CLOTEST) - Signed 

## 2011-01-24 ENCOUNTER — Ambulatory Visit: Payer: Self-pay | Admitting: Internal Medicine

## 2011-04-23 ENCOUNTER — Emergency Department (HOSPITAL_COMMUNITY): Payer: Medicare Other

## 2011-04-23 ENCOUNTER — Emergency Department (HOSPITAL_COMMUNITY)
Admission: EM | Admit: 2011-04-23 | Discharge: 2011-04-23 | Disposition: A | Discharge status: Home / Self Care | Payer: Medicare Other | Attending: Emergency Medicine | Admitting: Emergency Medicine

## 2011-04-23 DIAGNOSIS — M79609 Pain in unspecified limb: Secondary | ICD-10-CM | POA: Insufficient documentation

## 2011-04-23 DIAGNOSIS — S6000XA Contusion of unspecified finger without damage to nail, initial encounter: Secondary | ICD-10-CM | POA: Insufficient documentation

## 2011-07-26 ENCOUNTER — Encounter: Payer: Self-pay | Admitting: Gastroenterology

## 2011-07-26 ENCOUNTER — Other Ambulatory Visit (INDEPENDENT_AMBULATORY_CARE_PROVIDER_SITE_OTHER): Payer: Medicare Other

## 2011-07-26 ENCOUNTER — Ambulatory Visit (INDEPENDENT_AMBULATORY_CARE_PROVIDER_SITE_OTHER): Payer: Medicare Other | Admitting: Gastroenterology

## 2011-07-26 VITALS — BP 110/70 | HR 76 | Ht 66.5 in | Wt 143.8 lb

## 2011-07-26 DIAGNOSIS — K625 Hemorrhage of anus and rectum: Secondary | ICD-10-CM

## 2011-07-26 DIAGNOSIS — R6889 Other general symptoms and signs: Secondary | ICD-10-CM | POA: Insufficient documentation

## 2011-07-26 DIAGNOSIS — K644 Residual hemorrhoidal skin tags: Secondary | ICD-10-CM

## 2011-07-26 LAB — CBC WITH DIFFERENTIAL/PLATELET
Basophils Absolute: 0 10*3/uL (ref 0.0–0.1)
Eosinophils Absolute: 0.1 10*3/uL (ref 0.0–0.7)
Lymphocytes Relative: 38.5 % (ref 12.0–46.0)
MCHC: 34.4 g/dL (ref 30.0–36.0)
Neutrophils Relative %: 51.7 % (ref 43.0–77.0)
RDW: 13.5 % (ref 11.5–14.6)

## 2011-07-26 LAB — IBC PANEL
Iron: 84 ug/dL (ref 42–145)
Saturation Ratios: 25 % (ref 20.0–50.0)
Transferrin: 239.8 mg/dL (ref 212.0–360.0)

## 2011-07-26 LAB — FOLATE: Folate: >24.8 ng/mL (ref >5.9)

## 2011-07-26 LAB — FERRITIN: Ferritin: 203.3 ng/mL (ref 10.0–291.0)

## 2011-07-26 MED ORDER — HYDROCORTISONE ACE-PRAMOXINE 1-1 % RE CREA: TOPICAL_CREAM | Freq: Two times a day (BID) | RECTAL | Status: AC

## 2011-07-26 NOTE — Patient Instructions (Signed)
Please go to the basement today for your labs.  Your prescription(s) have been sent to you pharmacy.  Buy Metamucil and take it once a day.

## 2011-07-26 NOTE — Progress Notes (Signed)
This is a 76 year old African American female with recurrent hemorrhoidal bleeding and mild constipation. She currently is doing well occasional have bright red blood per rectum every 3-4 months. She denies rectal abdominal pain, and is up to date on her colonoscopy exams. She gives no history of chronic anemia or other GI complaints.  Current Medications, Allergies, Past Medical History, Past Surgical History, Family History and Social History were reviewed in [employer] record.  Pertinent Review of Systems Negative   Physical Exam: Awake and alert no acute distress appearing her stated age. Abdominal exam shows no distention, organomegaly, masses or tenderness. Bowel sounds are normal. There are large external hemorrhoids noted with some friability. There are no fissures, fistula, and rectal exam shows no masses or tenderness with guaiac-negative stool.    Assessment and Plan: She has large external hemorrhoids with recurrent but intermittent rectal bleeding, and is otherwise asymptomatic. She does not wish surgical intervention at this time. I have placed her on a bulk additive in her diet, liberal by mouth fluids, and when necessary Analpram cream locally. Also we had a long discussion concerning hemorrhoids and their management, she was given printed information to review. Encounter Diagnoses  Name Primary?  . Rectal bleeding Yes  . Other general symptoms

## 2012-03-28 DIAGNOSIS — H04129 Dry eye syndrome of unspecified lacrimal gland: Secondary | ICD-10-CM | POA: Diagnosis not present

## 2012-03-28 DIAGNOSIS — H02409 Unspecified ptosis of unspecified eyelid: Secondary | ICD-10-CM | POA: Diagnosis not present

## 2012-03-28 DIAGNOSIS — H26499 Other secondary cataract, unspecified eye: Secondary | ICD-10-CM | POA: Diagnosis not present

## 2012-08-23 DIAGNOSIS — Z23 Encounter for immunization: Secondary | ICD-10-CM | POA: Diagnosis not present

## 2012-11-20 DIAGNOSIS — B351 Tinea unguium: Secondary | ICD-10-CM | POA: Diagnosis not present

## 2012-11-20 DIAGNOSIS — L608 Other nail disorders: Secondary | ICD-10-CM | POA: Diagnosis not present

## 2013-01-02 DIAGNOSIS — Z79899 Other long term (current) drug therapy: Secondary | ICD-10-CM | POA: Diagnosis not present

## 2013-02-08 DIAGNOSIS — Z79899 Other long term (current) drug therapy: Secondary | ICD-10-CM | POA: Diagnosis not present

## 2013-08-21 ENCOUNTER — Emergency Department (HOSPITAL_COMMUNITY)
Admission: EM | Admit: 2013-08-21 | Discharge: 2013-08-21 | Disposition: A | Discharge status: Home / Self Care | Payer: Medicare Other | Attending: Emergency Medicine | Admitting: Emergency Medicine

## 2013-08-21 ENCOUNTER — Encounter (HOSPITAL_COMMUNITY): Payer: Self-pay

## 2013-08-21 DIAGNOSIS — R197 Diarrhea, unspecified: Secondary | ICD-10-CM | POA: Insufficient documentation

## 2013-08-21 DIAGNOSIS — Z79899 Other long term (current) drug therapy: Secondary | ICD-10-CM | POA: Insufficient documentation

## 2013-08-21 DIAGNOSIS — Z8739 Personal history of other diseases of the musculoskeletal system and connective tissue: Secondary | ICD-10-CM | POA: Diagnosis not present

## 2013-08-21 DIAGNOSIS — Z8719 Personal history of other diseases of the digestive system: Secondary | ICD-10-CM | POA: Diagnosis not present

## 2013-08-21 DIAGNOSIS — R112 Nausea with vomiting, unspecified: Secondary | ICD-10-CM | POA: Diagnosis not present

## 2013-08-21 DIAGNOSIS — K2970 Gastritis, unspecified, without bleeding: Secondary | ICD-10-CM | POA: Diagnosis not present

## 2013-08-21 DIAGNOSIS — R1110 Vomiting, unspecified: Secondary | ICD-10-CM | POA: Diagnosis not present

## 2013-08-21 LAB — COMPREHENSIVE METABOLIC PANEL
Albumin: 3.4 g/dL — ABNORMAL LOW (ref 3.5–5.2)
Alkaline Phosphatase: 83 U/L (ref 39–117)
BUN: 19 mg/dL (ref 6–23)
CO2: 26 mEq/L (ref 19–32)
Chloride: 105 mEq/L (ref 96–112)
Potassium: 4.3 mEq/L (ref 3.5–5.1)
Total Bilirubin: 0.2 mg/dL — ABNORMAL LOW (ref 0.3–1.2)

## 2013-08-21 LAB — URINALYSIS, ROUTINE W REFLEX MICROSCOPIC
Hgb urine dipstick: NEGATIVE
Nitrite: NEGATIVE
Protein, ur: NEGATIVE mg/dL
Specific Gravity, Urine: 1.024 (ref 1.005–1.030)
Urobilinogen, UA: 0.2 mg/dL (ref 0.0–1.0)

## 2013-08-21 LAB — LIPASE, BLOOD: Lipase: 46 U/L (ref 11–59)

## 2013-08-21 LAB — URINE MICROSCOPIC-ADD ON

## 2013-08-21 LAB — CBC WITH DIFFERENTIAL/PLATELET
Basophils Relative: 0 % (ref 0–1)
Hemoglobin: 12.3 g/dL (ref 12.0–15.0)
Lymphocytes Relative: 13 % (ref 12–46)
Lymphs Abs: 1.1 10*3/uL (ref 0.7–4.0)
MCHC: 37 g/dL — ABNORMAL HIGH (ref 30.0–36.0)
Monocytes Relative: 4 % (ref 3–12)
Neutro Abs: 7 10*3/uL (ref 1.7–7.7)
Neutrophils Relative %: 82 % — ABNORMAL HIGH (ref 43–77)
RBC: 4.07 MIL/uL (ref 3.87–5.11)
WBC: 8.6 10*3/uL (ref 4.0–10.5)

## 2013-08-21 MED ORDER — ONDANSETRON 4 MG PO TBDP: 4.0000 mg | ORAL_TABLET | Freq: Three times a day (TID) | ORAL | Status: DC | PRN

## 2013-08-21 MED ORDER — ONDANSETRON HCL 4 MG/2ML IJ SOLN
4.0000 mg | Freq: Once | INTRAMUSCULAR | Status: AC
  Administered 2013-08-21: 4 mg via INTRAVENOUS
  Filled 2013-08-21: qty 2

## 2013-08-21 NOTE — ED Provider Notes (Signed)
CSN: [id number]     Arrival date & time 08/21/13  0111 History   First MD Initiated Contact with Patient 08/21/13 0133     Chief Complaint  Patient presents with  . Nausea  . Emesis   (Consider location/radiation/quality/duration/timing/severity/associated sxs/prior Treatment) HPI 78 yo female presents to the emergency department via EMS from home with complaint of nausea, vomiting, and diarrhea.  She has had 2 episodes of vomiting and diarrhea starting this evening.  No fevers no chills.  Patient and grandson thinks there may have been some blood in one episode of emesis.  She ate pizza for dinner.  No abdominal pain.  She is no longer feeling nauseated or having abdominal cramping.  No prior history of gastric ulcers.  She does have history of GERD. Past Medical History  Diagnosis Date  . Osteoarthrosis, unspecified whether generalized or localized, unspecified site   . Unspecified hemorrhoids without mention of complication   . Flatulence, eructation, and gas pain   . Esophageal reflux   . Blood in stool   . Esophageal reflux    Past Surgical History  Procedure Laterality Date  . Abdominal hysterectomy     Family History  Problem Relation Age of Onset  . Diabetes    . Hypertension    . Colon cancer Neg Hx    History  Substance Use Topics  . Smoking status: Never Smoker   . Smokeless tobacco: Never Used  . Alcohol Use: No   OB History   Grav Para Term Preterm Abortions TAB SAB Ect Mult Living                 Review of Systems  All other systems reviewed and are negative.    Allergies  Review of patient's allergies indicates no known allergies.  Home Medications   Current Outpatient Rx  Name  Route  Sig  Dispense  Refill  . calcium carbonate (TUMS - DOSED IN MG ELEMENTAL CALCIUM) 500 MG chewable tablet   Oral   Chew 2 tablets by mouth daily.          BP 116/75  Pulse 67  Temp(Src) 98.3 F (36.8 C)  Resp 18  SpO2 100% Physical Exam  Nursing note and  vitals reviewed. Constitutional: She is oriented to person, place, and time. She appears well-developed and well-nourished.  HENT:  Head: Normocephalic and atraumatic.  Nose: Nose normal.  Mouth/Throat: Oropharynx is clear and moist.  Eyes: Conjunctivae and EOM are normal. Pupils are equal, round, and reactive to light.  Neck: Normal range of motion. Neck supple. No JVD present. No tracheal deviation present. No thyromegaly present.  Cardiovascular: Normal rate, regular rhythm, normal heart sounds and intact distal pulses.  Exam reveals no gallop and no friction rub.   No murmur heard. Pulmonary/Chest: Effort normal and breath sounds normal. No stridor. No respiratory distress. She has no wheezes. She has no rales. She exhibits no tenderness.  Abdominal: Soft. Bowel sounds are normal. She exhibits no distension and no mass. There is no tenderness. There is no rebound and no guarding.  Musculoskeletal: Normal range of motion. She exhibits no edema and no tenderness.  Lymphadenopathy:    She has no cervical adenopathy.  Neurological: She is alert and oriented to person, place, and time. She exhibits normal muscle tone. Coordination normal.  Skin: Skin is warm and dry. No rash noted. No erythema. No pallor.  Psychiatric: She has a normal mood and affect. Her behavior is normal. Judgment and thought  content normal.    ED Course  Procedures (including critical care time) Labs Review Labs Reviewed  CBC WITH DIFFERENTIAL - Abnormal; Notable for the following:    HCT 33.2 (*)    MCHC 37.0 (*)    Neutrophils Relative % 82 (*)    All other components within normal limits  COMPREHENSIVE METABOLIC PANEL - Abnormal; Notable for the following:    Glucose, Bld 143 (*)    Albumin 3.4 (*)    Total Bilirubin 0.2 (*)    GFR calc non Af Amer 79 (*)    All other components within normal limits  URINALYSIS, ROUTINE W REFLEX MICROSCOPIC - Abnormal; Notable for the following:    Leukocytes, UA SMALL (*)     All other components within normal limits  URINE MICROSCOPIC-ADD ON - Abnormal; Notable for the following:    Casts GRANULAR CAST (*)    All other components within normal limits  URINE CULTURE  LIPASE, BLOOD   Imaging Review No results found.  MDM   1. Nausea vomiting and diarrhea     78 year old female with nausea, vomiting, and diarrhea.  There was some question of vomiting blood, but I wonder if this was marinara sauce.  She has had no further nausea, vomiting, or diarrhea.  She is not anemic.  Her labs are unremarkable.  Will try by mouth challenge, and expect she will be able to go home with nausea medications.   Olivia Mackie, MD 08/21/13 [phone]

## 2013-08-21 NOTE — ED Notes (Signed)
Pt reports several episodes of emesis tonight. Sts she had 2 episodes of vomiting and 2 episodes of diarrhea.  Reports seeing blood in emesis and diarrhea.  EMS denies seeing any.  Pt very vague about details from tonight.  Currently denying any pain at the moment.  No tenderness noted during palpation.  VSS with EMS and upon arrival.

## 2013-08-22 ENCOUNTER — Telehealth: Payer: Self-pay

## 2013-08-22 NOTE — Telephone Encounter (Signed)
This CM spoke with Mr. Witte regarding his mother making an appointment to establish care with Dr. Ashley Royalty. Appt scheduled on 09/04/13 at 1:15 pm     Karoline Caldwell, RN, BSN, [place]    [phone]

## 2013-08-24 LAB — URINE CULTURE

## 2013-08-25 ENCOUNTER — Telehealth (HOSPITAL_COMMUNITY): Payer: Self-pay | Admitting: Emergency Medicine

## 2013-08-25 NOTE — ED Notes (Signed)
Post ED Visit - Positive Culture Follow-up  Culture report reviewed by antimicrobial stewardship pharmacist: []  Wes Dulaney, Pharm.D., BCPS [x]  Celedonio Miyamoto, Pharm.D., BCPS []  Georgina Pillion, [address].D., BCPS []  [place], [address].D., BCPS, AAHIVP []  Estella Husk, Pharm.D., BCPS, AAHIVP  Positive urine culture Per Coral Ceo PA-C, no treatment needed since asymptomatic and no further patient follow-up is required at this time.  Kylie A Holland 08/25/2013, 9:38 AM

## 2013-08-26 ENCOUNTER — Telehealth: Payer: Self-pay | Admitting: Gastroenterology

## 2013-08-26 NOTE — Telephone Encounter (Signed)
Patient very confused on who she was calling for and why.  She had vomiting and diarrhea last week, no current GI complaints.  She says she went to an ER and was told to follow up with her doctor.  She is provided the number to her primary care and asked to follow up with him.  She kept asking about the "doctor that did my hysterectomy".  I explained that neither Dr. Yetta Barre or Jarold Motto would have performed an operation on her.  She will call for a follow up with her primary care MD

## 2013-08-28 ENCOUNTER — Ambulatory Visit (INDEPENDENT_AMBULATORY_CARE_PROVIDER_SITE_OTHER): Payer: Medicare Other | Admitting: Internal Medicine

## 2013-08-28 ENCOUNTER — Encounter: Payer: Self-pay | Admitting: Internal Medicine

## 2013-08-28 ENCOUNTER — Other Ambulatory Visit (INDEPENDENT_AMBULATORY_CARE_PROVIDER_SITE_OTHER): Payer: Medicare Other

## 2013-08-28 VITALS — BP 120/64 | HR 69 | Temp 98.6°F | Resp 16 | Wt 139.0 lb

## 2013-08-28 DIAGNOSIS — R1310 Dysphagia, unspecified: Secondary | ICD-10-CM | POA: Insufficient documentation

## 2013-08-28 DIAGNOSIS — M899 Disorder of bone, unspecified: Secondary | ICD-10-CM

## 2013-08-28 DIAGNOSIS — Z0000 Encounter for general adult medical examination without abnormal findings: Secondary | ICD-10-CM | POA: Insufficient documentation

## 2013-08-28 DIAGNOSIS — M8580 Other specified disorders of bone density and structure, unspecified site: Secondary | ICD-10-CM

## 2013-08-28 DIAGNOSIS — K625 Hemorrhage of anus and rectum: Secondary | ICD-10-CM

## 2013-08-28 DIAGNOSIS — Z23 Encounter for immunization: Secondary | ICD-10-CM

## 2013-08-28 DIAGNOSIS — R7309 Other abnormal glucose: Secondary | ICD-10-CM

## 2013-08-28 DIAGNOSIS — E785 Hyperlipidemia, unspecified: Secondary | ICD-10-CM

## 2013-08-28 DIAGNOSIS — D510 Vitamin B12 deficiency anemia due to intrinsic factor deficiency: Secondary | ICD-10-CM

## 2013-08-28 DIAGNOSIS — Z1231 Encounter for screening mammogram for malignant neoplasm of breast: Secondary | ICD-10-CM | POA: Insufficient documentation

## 2013-08-28 DIAGNOSIS — R739 Hyperglycemia, unspecified: Secondary | ICD-10-CM | POA: Insufficient documentation

## 2013-08-28 DIAGNOSIS — K219 Gastro-esophageal reflux disease without esophagitis: Secondary | ICD-10-CM

## 2013-08-28 LAB — COMPREHENSIVE METABOLIC PANEL
Albumin: 3.8 g/dL (ref 3.5–5.2)
BUN: 14 mg/dL (ref 6–23)
CO2: 31 mEq/L (ref 19–32)
Calcium: 8.9 mg/dL (ref 8.4–10.5)
Chloride: 102 mEq/L (ref 96–112)
Creatinine, Ser: 1 mg/dL (ref 0.4–1.2)
GFR: 67.69 mL/min (ref >60.00)
Glucose, Bld: 95 mg/dL (ref 70–99)
Potassium: 3.4 mEq/L — ABNORMAL LOW (ref 3.5–5.1)

## 2013-08-28 LAB — CBC WITH DIFFERENTIAL/PLATELET
Basophils Relative: 0.6 % (ref 0.0–3.0)
Eosinophils Relative: 2.8 % (ref 0.0–5.0)
Hemoglobin: 12.7 g/dL (ref 12.0–15.0)
Lymphocytes Relative: 34.3 % (ref 12.0–46.0)
Neutro Abs: 2.4 10*3/uL (ref 1.4–7.7)
Neutrophils Relative %: 53.1 % (ref 43.0–77.0)
RBC: 4.31 Mil/uL (ref 3.87–5.11)
WBC: 4.5 10*3/uL (ref 4.5–10.5)

## 2013-08-28 LAB — FECAL OCCULT BLOOD, GUAIAC: Fecal Occult Blood: NEGATIVE

## 2013-08-28 LAB — LIPID PANEL
HDL: 66.1 mg/dL (ref >39.00)
VLDL: 37 mg/dL (ref 0.0–40.0)

## 2013-08-28 LAB — IBC PANEL: Saturation Ratios: 18.1 % — ABNORMAL LOW (ref 20.0–50.0)

## 2013-08-28 LAB — FERRITIN: Ferritin: 200.1 ng/mL (ref 10.0–291.0)

## 2013-08-28 LAB — TSH: TSH: 2.35 u[IU]/mL (ref 0.35–5.50)

## 2013-08-28 MED ORDER — OMEPRAZOLE 40 MG PO CPDR: 40.0000 mg | DELAYED_RELEASE_CAPSULE | Freq: Every day | ORAL | Status: DC

## 2013-08-28 MED ORDER — ATORVASTATIN CALCIUM 80 MG PO TABS: 80.0000 mg | ORAL_TABLET | Freq: Every day | ORAL | Status: DC

## 2013-08-28 NOTE — Progress Notes (Signed)
Subjective:    Patient ID: Katelyn Sparks, female    DOB: [birth year], 78 y.o.   MRN: [id number]  Gastrophageal Reflux She complains of dysphagia and heartburn. She reports no abdominal pain, no belching, no chest pain, no choking, no coughing, no early satiety, no globus sensation, no hoarse voice, no nausea, no sore throat, no stridor, no tooth decay, no water brash or no wheezing. This is a chronic problem. The heartburn does not wake her from sleep. The heartburn does not limit her activity. The heartburn doesn't change with position. Associated symptoms include anemia and weight loss. Pertinent negatives include no fatigue, melena, muscle weakness or orthopnea. She has tried nothing for the symptoms.      Review of Systems  Constitutional: Positive for weight loss and unexpected weight change. Negative for fever, chills, diaphoresis, activity change, appetite change and fatigue.  HENT: Positive for trouble swallowing. Negative for sore throat, hoarse voice and voice change.   Eyes: Negative.   Respiratory: Negative.  Negative for cough, choking, chest tightness, shortness of breath, wheezing and stridor.   Cardiovascular: Negative.  Negative for chest pain, palpitations and leg swelling.  Gastrointestinal: Positive for heartburn, dysphagia and anal bleeding. Negative for nausea, vomiting, abdominal pain, diarrhea, constipation, blood in stool, melena, abdominal distention and rectal pain.  Endocrine: Negative.  Negative for polydipsia, polyphagia and polyuria.  Genitourinary: Negative.   Musculoskeletal: Negative.  Negative for muscle weakness.  Allergic/Immunologic: Negative.   Neurological: Negative.  Negative for dizziness, tremors, facial asymmetry, weakness, light-headedness, numbness and headaches.  Hematological: Negative.  Negative for adenopathy. Does not bruise/bleed easily.  Psychiatric/Behavioral: Negative.        Objective:   Physical Exam  Vitals  reviewed. Constitutional: She is oriented to person, place, and time. She appears well-developed and well-nourished. No distress.  HENT:  Head: Normocephalic and atraumatic.  Mouth/Throat: Oropharynx is clear and moist. No oropharyngeal exudate.  Eyes: Conjunctivae are normal. Right eye exhibits no discharge. Left eye exhibits no discharge. No scleral icterus.  Neck: Normal range of motion. Neck supple. No JVD present. No tracheal deviation present. No thyromegaly present.  Cardiovascular: Normal rate, regular rhythm, normal heart sounds and intact distal pulses.  Exam reveals no gallop and no friction rub.   No murmur heard. Pulmonary/Chest: Effort normal and breath sounds normal. No stridor. No respiratory distress. She has no wheezes. She has no rales. She exhibits no tenderness.  Abdominal: Soft. Bowel sounds are normal. She exhibits no distension and no mass. There is no tenderness. There is no rebound and no guarding. Hernia confirmed negative in the right inguinal area and confirmed negative in the left inguinal area.  Genitourinary: Rectal exam shows external hemorrhoid. Rectal exam shows no internal hemorrhoid, no fissure, no mass, no tenderness and anal tone normal. Guaiac negative stool. No breast swelling, tenderness, discharge or bleeding. Pelvic exam was performed with patient supine. Right adnexum displays no mass, no tenderness and no fullness. Left adnexum displays no mass, no tenderness and no fullness.  Musculoskeletal: Normal range of motion. She exhibits no edema and no tenderness.  Lymphadenopathy:    She has no cervical adenopathy.       Right: No inguinal adenopathy present.       Left: No inguinal adenopathy present.  Neurological: She is oriented to person, place, and time.  Skin: Skin is warm and dry. No rash noted. She is not diaphoretic. No erythema. No pallor.  Psychiatric: She has a normal mood and affect. Her behavior is  normal. Judgment and thought content normal.      Lab Results  Component Value Date   WBC 8.6 08/21/2013   HGB 12.3 08/21/2013   HCT 33.2* 08/21/2013   PLT 188 08/21/2013   GLUCOSE 143* 08/21/2013   ALT 13 08/21/2013   AST 16 08/21/2013   NA 140 08/21/2013   K 4.3 08/21/2013   CL 105 08/21/2013   CREATININE 0.79 08/21/2013   BUN 19 08/21/2013   CO2 26 08/21/2013       Assessment & Plan:

## 2013-08-28 NOTE — Patient Instructions (Signed)
Preventive Care for Adults, Female A healthy lifestyle and preventive care can promote health and wellness. Preventive health guidelines for women include the following key practices.  A routine yearly physical is a good way to check with your caregiver about your health and preventive screening. It is a chance to share any concerns and updates on your health, and to receive a thorough exam.  Visit your dentist for a routine exam and preventive care every 6 months. Brush your teeth twice a day and floss once a day. Good oral hygiene prevents tooth decay and gum disease.  The frequency of eye exams is based on your age, health, family medical history, use of contact lenses, and other factors. Follow your caregiver's recommendations for frequency of eye exams.  Eat a healthy diet. Foods like vegetables, fruits, whole grains, low-fat dairy products, and lean protein foods contain the nutrients you need without too many calories. Decrease your intake of foods high in solid fats, added sugars, and salt. Eat the right amount of calories for you.Get information about a proper diet from your caregiver, if necessary.  Regular physical exercise is one of the most important things you can do for your health. Most adults should get at least 150 minutes of moderate-intensity exercise (any activity that increases your heart rate and causes you to sweat) each week. In addition, most adults need muscle-strengthening exercises on 2 or more days a week.  Maintain a healthy weight. The body mass index (BMI) is a screening tool to identify possible weight problems. It provides an estimate of body fat based on height and weight. Your caregiver can help determine your BMI, and can help you achieve or maintain a healthy weight.For adults 20 years and older:  A BMI below 18.5 is considered underweight.  A BMI of 18.5 to 24.9 is normal.  A BMI of 25 to 29.9 is considered overweight.  A BMI of 30 and above is  considered obese.  Maintain normal blood lipids and cholesterol levels by exercising and minimizing your intake of saturated fat. Eat a balanced diet with plenty of fruit and vegetables. Blood tests for lipids and cholesterol should begin at age 20 and be repeated every 5 years. If your lipid or cholesterol levels are high, you are over 50, or you are at high risk for heart disease, you may need your cholesterol levels checked more frequently.Ongoing high lipid and cholesterol levels should be treated with medicines if diet and exercise are not effective.  If you smoke, find out from your caregiver how to quit. If you do not use tobacco, do not start.  If you are pregnant, do not drink alcohol. If you are breastfeeding, be very cautious about drinking alcohol. If you are not pregnant and choose to drink alcohol, do not exceed 1 drink per day. One drink is considered to be 12 ounces (355 mL) of beer, 5 ounces (148 mL) of wine, or 1.5 ounces (44 mL) of liquor.  Avoid use of street drugs. Do not share needles with anyone. Ask for help if you need support or instructions about stopping the use of drugs.  High blood pressure causes heart disease and increases the risk of stroke. Your blood pressure should be checked at least every 1 to 2 years. Ongoing high blood pressure should be treated with medicines if weight loss and exercise are not effective.  If you are 55 to 76 years old, ask your caregiver if you should take aspirin to prevent strokes.  Diabetes   screening involves taking a blood sample to check your fasting blood sugar level. This should be done once every 3 years, after age 45, if you are within normal weight and without risk factors for diabetes. Testing should be considered at a younger age or be carried out more frequently if you are overweight and have at least 1 risk factor for diabetes.  Breast cancer screening is essential preventive care for women. You should practice "breast  self-awareness." This means understanding the normal appearance and feel of your breasts and may include breast self-examination. Any changes detected, no matter how small, should be reported to a caregiver. Women in their 20s and 30s should have a clinical breast exam (CBE) by a caregiver as part of a regular health exam every 1 to 3 years. After age 40, women should have a CBE every year. Starting at age 40, women should consider having a mammography (breast X-ray test) every year. Women who have a family history of breast cancer should talk to their caregiver about genetic screening. Women at a high risk of breast cancer should talk to their caregivers about having magnetic resonance imaging (MRI) and a mammography every year.  The Pap test is a screening test for cervical cancer. A Pap test can show cell changes on the cervix that might become cervical cancer if left untreated. A Pap test is a procedure in which cells are obtained and examined from the lower end of the uterus (cervix).  Women should have a Pap test starting at age 21.  Between ages 21 and 29, Pap tests should be repeated every 2 years.  Beginning at age 30, you should have a Pap test every 3 years as long as the past 3 Pap tests have been normal.  Some women have medical problems that increase the chance of getting cervical cancer. Talk to your caregiver about these problems. It is especially important to talk to your caregiver if a new problem develops soon after your last Pap test. In these cases, your caregiver may recommend more frequent screening and Pap tests.  The above recommendations are the same for women who have or have not gotten the vaccine for human papillomavirus (HPV).  If you had a hysterectomy for a problem that was not cancer or a condition that could lead to cancer, then you no longer need Pap tests. Even if you no longer need a Pap test, a regular exam is a good idea to make sure no other problems are  starting.  If you are between ages 65 and 70, and you have had normal Pap tests going back 10 years, you no longer need Pap tests. Even if you no longer need a Pap test, a regular exam is a good idea to make sure no other problems are starting.  If you have had past treatment for cervical cancer or a condition that could lead to cancer, you need Pap tests and screening for cancer for at least 20 years after your treatment.  If Pap tests have been discontinued, risk factors (such as a new sexual partner) need to be reassessed to determine if screening should be resumed.  The HPV test is an additional test that may be used for cervical cancer screening. The HPV test looks for the virus that can cause the cell changes on the cervix. The cells collected during the Pap test can be tested for HPV. The HPV test could be used to screen women aged 30 years and older, and should   be used in women of any age who have unclear Pap test results. After the age of 30, women should have HPV testing at the same frequency as a Pap test.  Colorectal cancer can be detected and often prevented. Most routine colorectal cancer screening begins at the age of 50 and continues through age 75. However, your caregiver may recommend screening at an earlier age if you have risk factors for colon cancer. On a yearly basis, your caregiver may provide home test kits to check for hidden blood in the stool. Use of a small camera at the end of a tube, to directly examine the colon (sigmoidoscopy or colonoscopy), can detect the earliest forms of colorectal cancer. Talk to your caregiver about this at age 50, when routine screening begins. Direct examination of the colon should be repeated every 5 to 10 years through age 75, unless early forms of pre-cancerous polyps or small growths are found.  Hepatitis C blood testing is recommended for all people born from 1945 through 1965 and any individual with known risks for hepatitis C.  Practice  safe sex. Use condoms and avoid high-risk sexual practices to reduce the spread of sexually transmitted infections (STIs). STIs include gonorrhea, chlamydia, syphilis, trichomonas, herpes, HPV, and human immunodeficiency virus (HIV). Herpes, HIV, and HPV are viral illnesses that have no cure. They can result in disability, cancer, and death. Sexually active women aged 25 and younger should be checked for chlamydia. Older women with new or multiple partners should also be tested for chlamydia. Testing for other STIs is recommended if you are sexually active and at increased risk.  Osteoporosis is a disease in which the bones lose minerals and strength with aging. This can result in serious bone fractures. The risk of osteoporosis can be identified using a bone density scan. Women ages 65 and over and women at risk for fractures or osteoporosis should discuss screening with their caregivers. Ask your caregiver whether you should take a calcium supplement or vitamin D to reduce the rate of osteoporosis.  Menopause can be associated with physical symptoms and risks. Hormone replacement therapy is available to decrease symptoms and risks. You should talk to your caregiver about whether hormone replacement therapy is right for you.  Use sunscreen with sun protection factor (SPF) of 30 or more. Apply sunscreen liberally and repeatedly throughout the day. You should seek shade when your shadow is shorter than you. Protect yourself by wearing long sleeves, pants, a wide-brimmed hat, and sunglasses year round, whenever you are outdoors.  Once a month, do a whole body skin exam, using a mirror to look at the skin on your back. Notify your caregiver of new moles, moles that have irregular borders, moles that are larger than a pencil eraser, or moles that have changed in shape or color.  Stay current with required immunizations.  Influenza. You need a dose every fall (or winter). The composition of the flu vaccine  changes each year, so being vaccinated once is not enough.  Pneumococcal polysaccharide. You need 1 to 2 doses if you smoke cigarettes or if you have certain chronic medical conditions. You need 1 dose at age 65 (or older) if you have never been vaccinated.  Tetanus, diphtheria, pertussis (Tdap, Td). Get 1 dose of Tdap vaccine if you are younger than age 65, are over 65 and have contact with an infant, are a healthcare worker, are pregnant, or simply want to be protected from whooping cough. After that, you need a Td   booster dose every 10 years. Consult your caregiver if you have not had at least 3 tetanus and diphtheria-containing shots sometime in your life or have a deep or dirty wound.  HPV. You need this vaccine if you are a woman age 26 or younger. The vaccine is given in 3 doses over 6 months.  Measles, mumps, rubella (MMR). You need at least 1 dose of MMR if you were born in 1957 or later. You may also need a second dose.  Meningococcal. If you are age 19 to 21 and a first-year college student living in a residence hall, or have one of several medical conditions, you need to get vaccinated against meningococcal disease. You may also need additional booster doses.  Zoster (shingles). If you are age 60 or older, you should get this vaccine.  Varicella (chickenpox). If you have never had chickenpox or you were vaccinated but received only 1 dose, talk to your caregiver to find out if you need this vaccine.  Hepatitis A. You need this vaccine if you have a specific risk factor for hepatitis A virus infection or you simply wish to be protected from this disease. The vaccine is usually given as 2 doses, 6 to 18 months apart.  Hepatitis B. You need this vaccine if you have a specific risk factor for hepatitis B virus infection or you simply wish to be protected from this disease. The vaccine is given in 3 doses, usually over 6 months. Preventive Services / Frequency Ages 19 to 39  Blood  pressure check.** / Every 1 to 2 years.  Lipid and cholesterol check.** / Every 5 years beginning at age 20.  Clinical breast exam.** / Every 3 years for women in their 20s and 30s.  Pap test.** / Every 2 years from ages 21 through 29. Every 3 years starting at age 30 through age 65 or 70 with a history of 3 consecutive normal Pap tests.  HPV screening.** / Every 3 years from ages 30 through ages 65 to 70 with a history of 3 consecutive normal Pap tests.  Hepatitis C blood test.** / For any individual with known risks for hepatitis C.  Skin self-exam. / Monthly.  Influenza immunization.** / Every year.  Pneumococcal polysaccharide immunization.** / 1 to 2 doses if you smoke cigarettes or if you have certain chronic medical conditions.  Tetanus, diphtheria, pertussis (Tdap, Td) immunization. / A one-time dose of Tdap vaccine. After that, you need a Td booster dose every 10 years.  HPV immunization. / 3 doses over 6 months, if you are 26 and younger.  Measles, mumps, rubella (MMR) immunization. / You need at least 1 dose of MMR if you were born in 1957 or later. You may also need a second dose.  Meningococcal immunization. / 1 dose if you are age 19 to 21 and a first-year college student living in a residence hall, or have one of several medical conditions, you need to get vaccinated against meningococcal disease. You may also need additional booster doses.  Varicella immunization.** / Consult your caregiver.  Hepatitis A immunization.** / Consult your caregiver. 2 doses, 6 to 18 months apart.  Hepatitis B immunization.** / Consult your caregiver. 3 doses usually over 6 months. Ages 40 to 64  Blood pressure check.** / Every 1 to 2 years.  Lipid and cholesterol check.** / Every 5 years beginning at age 20.  Clinical breast exam.** / Every year after age 40.  Mammogram.** / Every year beginning at age 40   and continuing for as long as you are in good health. Consult with your  caregiver.  Pap test.** / Every 3 years starting at age 30 through age 65 or 70 with a history of 3 consecutive normal Pap tests.  HPV screening.** / Every 3 years from ages 30 through ages 65 to 70 with a history of 3 consecutive normal Pap tests.  Fecal occult blood test (FOBT) of stool. / Every year beginning at age 50 and continuing until age 75. You may not need to do this test if you get a colonoscopy every 10 years.  Flexible sigmoidoscopy or colonoscopy.** / Every 5 years for a flexible sigmoidoscopy or every 10 years for a colonoscopy beginning at age 50 and continuing until age 75.  Hepatitis C blood test.** / For all people born from 1945 through 1965 and any individual with known risks for hepatitis C.  Skin self-exam. / Monthly.  Influenza immunization.** / Every year.  Pneumococcal polysaccharide immunization.** / 1 to 2 doses if you smoke cigarettes or if you have certain chronic medical conditions.  Tetanus, diphtheria, pertussis (Tdap, Td) immunization.** / A one-time dose of Tdap vaccine. After that, you need a Td booster dose every 10 years.  Measles, mumps, rubella (MMR) immunization. / You need at least 1 dose of MMR if you were born in 1957 or later. You may also need a second dose.  Varicella immunization.** / Consult your caregiver.  Meningococcal immunization.** / Consult your caregiver.  Hepatitis A immunization.** / Consult your caregiver. 2 doses, 6 to 18 months apart.  Hepatitis B immunization.** / Consult your caregiver. 3 doses, usually over 6 months. Ages 65 and over  Blood pressure check.** / Every 1 to 2 years.  Lipid and cholesterol check.** / Every 5 years beginning at age 20.  Clinical breast exam.** / Every year after age 40.  Mammogram.** / Every year beginning at age 40 and continuing for as long as you are in good health. Consult with your caregiver.  Pap test.** / Every 3 years starting at age 30 through age 65 or 70 with a 3  consecutive normal Pap tests. Testing can be stopped between 65 and 70 with 3 consecutive normal Pap tests and no abnormal Pap or HPV tests in the past 10 years.  HPV screening.** / Every 3 years from ages 30 through ages 65 or 70 with a history of 3 consecutive normal Pap tests. Testing can be stopped between 65 and 70 with 3 consecutive normal Pap tests and no abnormal Pap or HPV tests in the past 10 years.  Fecal occult blood test (FOBT) of stool. / Every year beginning at age 50 and continuing until age 75. You may not need to do this test if you get a colonoscopy every 10 years.  Flexible sigmoidoscopy or colonoscopy.** / Every 5 years for a flexible sigmoidoscopy or every 10 years for a colonoscopy beginning at age 50 and continuing until age 75.  Hepatitis C blood test.** / For all people born from 1945 through 1965 and any individual with known risks for hepatitis C.  Osteoporosis screening.** / A one-time screening for women ages 65 and over and women at risk for fractures or osteoporosis.  Skin self-exam. / Monthly.  Influenza immunization.** / Every year.  Pneumococcal polysaccharide immunization.** / 1 dose at age 65 (or older) if you have never been vaccinated.  Tetanus, diphtheria, pertussis (Tdap, Td) immunization. / A one-time dose of Tdap vaccine if you are over   65 and have contact with an infant, are a Research scientist (physical sciences), or simply want to be protected from whooping cough. After that, you need a Td booster dose every 10 years.  Varicella immunization.** / Consult your caregiver.  Meningococcal immunization.** / Consult your caregiver.  Hepatitis A immunization.** / Consult your caregiver. 2 doses, 6 to 18 months apart.  Hepatitis B immunization.** / Check with your caregiver. 3 doses, usually over 6 months. ** Family history and personal history of risk and conditions may change your caregiver's recommendations. Document Released: 01/24/2002 Document Revised: 02/20/2012  Document Reviewed: 04/25/2011 [facility] Patient Information 2014 [place], [place]. Gastroesophageal Reflux Disease, Adult Gastroesophageal reflux disease (GERD) happens when acid from your stomach flows up into the esophagus. When acid comes in contact with the esophagus, the acid causes soreness (inflammation) in the esophagus. Over time, GERD may create small holes (ulcers) in the lining of the esophagus. CAUSES   Increased body weight. This puts pressure on the stomach, making acid rise from the stomach into the esophagus.  Smoking. This increases acid production in the stomach.  Drinking alcohol. This causes decreased pressure in the lower esophageal sphincter (valve or ring of muscle between the esophagus and stomach), allowing acid from the stomach into the esophagus.  Late evening meals and a full stomach. This increases pressure and acid production in the stomach.  A malformed lower esophageal sphincter. Sometimes, no cause is found. SYMPTOMS   Burning pain in the lower part of the mid-chest behind the breastbone and in the mid-stomach area. This may occur twice a week or more often.  Trouble swallowing.  Sore throat.  Dry cough.  Asthma-like symptoms including chest tightness, shortness of breath, or wheezing. DIAGNOSIS  Your caregiver may be able to diagnose GERD based on your symptoms. In some cases, X-rays and other tests may be done to check for complications or to check the condition of your stomach and esophagus. TREATMENT  Your caregiver may recommend over-the-counter or prescription medicines to help decrease acid production. Ask your caregiver before starting or adding any new medicines.  HOME CARE INSTRUCTIONS   Change the factors that you can control. Ask your caregiver for guidance concerning weight loss, quitting smoking, and alcohol consumption.  Avoid foods and drinks that make your symptoms worse, such as:  Caffeine or alcoholic  drinks.  Chocolate.  Peppermint or mint flavorings.  Garlic and onions.  Spicy foods.  Citrus fruits, such as oranges, lemons, or limes.  Tomato-based foods such as sauce, chili, salsa, and pizza.  Fried and fatty foods.  Avoid lying down for the 3 hours prior to your bedtime or prior to taking a nap.  Eat small, frequent meals instead of large meals.  Wear loose-fitting clothing. Do not wear anything tight around your waist that causes pressure on your stomach.  Raise the head of your bed 6 to 8 inches with wood blocks to help you sleep. Extra pillows will not help.  Only take over-the-counter or prescription medicines for pain, discomfort, or fever as directed by your caregiver.  Do not take aspirin, ibuprofen, or other nonsteroidal anti-inflammatory drugs (NSAIDs). SEEK IMMEDIATE MEDICAL CARE IF:   You have pain in your arms, neck, jaw, teeth, or back.  Your pain increases or changes in intensity or duration.  You develop nausea, vomiting, or sweating (diaphoresis).  You develop shortness of breath, or you faint.  Your vomit is green, yellow, black, or looks like coffee grounds or blood.  Your stool is red, bloody, or  black. These symptoms could be signs of other problems, such as heart disease, gastric bleeding, or esophageal bleeding. MAKE SURE YOU:   Understand these instructions.  Will watch your condition.  Will get help right away if you are not doing well or get worse. Document Released: 09/07/2005 Document Revised: 02/20/2012 Document Reviewed: 06/17/2011 [facility] Patient Information 2014 [place], [place].

## 2013-08-28 NOTE — Assessment & Plan Note (Signed)
Start atorvastatin

## 2013-08-28 NOTE — Assessment & Plan Note (Signed)
Start a PPI for symptom relief GI referral in light of the other symptoms

## 2013-08-28 NOTE — Assessment & Plan Note (Signed)
I will recheck her CBC and her vitamin levels 

## 2013-08-28 NOTE — Assessment & Plan Note (Addendum)

## 2013-08-28 NOTE — Assessment & Plan Note (Signed)
I will check her A1C to see if she has developed DM2 

## 2013-08-30 NOTE — Assessment & Plan Note (Signed)
Gi referral 

## 2013-08-30 NOTE — Assessment & Plan Note (Signed)
This is probably hemorrhoidal bleeding I will check her CBC She will see GI as well

## 2013-09-04 ENCOUNTER — Ambulatory Visit (INDEPENDENT_AMBULATORY_CARE_PROVIDER_SITE_OTHER): Payer: Medicare Other | Admitting: Internal Medicine

## 2013-09-04 ENCOUNTER — Encounter: Payer: Self-pay | Admitting: Internal Medicine

## 2013-09-04 ENCOUNTER — Ambulatory Visit: Payer: Medicare Other | Admitting: Internal Medicine

## 2013-09-04 ENCOUNTER — Ambulatory Visit (INDEPENDENT_AMBULATORY_CARE_PROVIDER_SITE_OTHER)
Admission: RE | Admit: 2013-09-04 | Discharge: 2013-09-04 | Disposition: A | Discharge status: Home / Self Care | Payer: Medicare Other | Source: Ambulatory Visit | Attending: Internal Medicine | Admitting: Internal Medicine

## 2013-09-04 VITALS — BP 146/86 | HR 66 | Temp 97.2°F | Resp 16 | Wt 140.8 lb

## 2013-09-04 DIAGNOSIS — R413 Other amnesia: Secondary | ICD-10-CM

## 2013-09-04 DIAGNOSIS — K219 Gastro-esophageal reflux disease without esophagitis: Secondary | ICD-10-CM

## 2013-09-04 DIAGNOSIS — IMO0001 Reserved for inherently not codable concepts without codable children: Secondary | ICD-10-CM

## 2013-09-04 DIAGNOSIS — F0390 Unspecified dementia without behavioral disturbance: Secondary | ICD-10-CM | POA: Insufficient documentation

## 2013-09-04 DIAGNOSIS — M25512 Pain in left shoulder: Secondary | ICD-10-CM

## 2013-09-04 DIAGNOSIS — M25519 Pain in unspecified shoulder: Secondary | ICD-10-CM

## 2013-09-04 MED ORDER — ONDANSETRON 4 MG PO TBDP: 4.0000 mg | ORAL_TABLET | Freq: Three times a day (TID) | ORAL | Status: DC | PRN

## 2013-09-04 NOTE — Assessment & Plan Note (Signed)
Neurology to evaluate and treat what appears to be dementia

## 2013-09-04 NOTE — Assessment & Plan Note (Signed)
Her plain film is normal I am concerned that she may have a rotator cuff tendonitis so I have referred her to sports medicine

## 2013-09-04 NOTE — Patient Instructions (Addendum)

## 2013-09-04 NOTE — Assessment & Plan Note (Signed)
meds are not needed for this at this time

## 2013-09-04 NOTE — Progress Notes (Signed)
Subjective:    Patient ID: Katelyn Sparks, female    DOB: 1935-06-03, 78 y.o.   MRN: [id number]  Shoulder Pain  This is a chronic problem. The current episode started more than 1 year ago. There has been no history of extremity trauma. The problem occurs constantly. The problem has been unchanged. The quality of the pain is described as aching. The pain is at a severity of 2/10. The pain is mild. Pertinent negatives include no fever, inability to bear weight, itching, joint locking, joint swelling, limited range of motion, numbness, stiffness or tingling. She has tried NSAIDS for the symptoms. The treatment provided moderate relief.      Review of Systems  Constitutional: Negative.  Negative for fever, chills, diaphoresis and fatigue.  HENT: Negative.   Eyes: Negative.   Respiratory: Negative.  Negative for cough, choking, chest tightness, shortness of breath, wheezing and stridor.   Cardiovascular: Negative.  Negative for chest pain, palpitations and leg swelling.  Gastrointestinal: Negative.  Negative for vomiting, abdominal pain, diarrhea, constipation and blood in stool.  Endocrine: Negative.   Genitourinary: Negative.   Musculoskeletal: Negative.  Negative for myalgias, back pain, joint swelling, arthralgias (left shoulder only), gait problem and stiffness.  Skin: Negative.  Negative for itching.  Allergic/Immunologic: Negative.   Neurological: Negative.  Negative for dizziness, tingling and numbness.  Hematological: Negative.  Negative for adenopathy. Does not bruise/bleed easily.  Psychiatric/Behavioral: Positive for behavioral problems (forgetful, loses her keys, has forgotten to eat), confusion and decreased concentration. Negative for suicidal ideas, hallucinations, sleep disturbance, self-injury, dysphoric mood and agitation. The patient is not nervous/anxious and is not hyperactive.        Objective:   Physical Exam  Vitals reviewed. Constitutional: She is oriented to  person, place, and time. She appears well-developed and well-nourished. No distress.  HENT:  Head: Normocephalic and atraumatic.  Mouth/Throat: Oropharynx is clear and moist. No oropharyngeal exudate.  Eyes: Conjunctivae are normal. Right eye exhibits no discharge. Left eye exhibits no discharge. No scleral icterus.  Neck: Normal range of motion. Neck supple. No JVD present. No tracheal deviation present. No thyromegaly present.  Cardiovascular: Normal rate, regular rhythm, normal heart sounds and intact distal pulses.  Exam reveals no gallop and no friction rub.   No murmur heard. Pulmonary/Chest: Effort normal and breath sounds normal. No stridor. No respiratory distress. She has no wheezes. She has no rales. She exhibits no tenderness.  Abdominal: Soft. Bowel sounds are normal. She exhibits no distension and no mass. There is no tenderness. There is no rebound and no guarding.  Musculoskeletal: She exhibits no edema and no tenderness.       Left shoulder: She exhibits decreased range of motion. She exhibits no tenderness, no bony tenderness, no swelling, no effusion, no crepitus, no deformity, no laceration, no pain, no spasm, normal pulse and normal strength.  Lymphadenopathy:    She has no cervical adenopathy.  Neurological: She is oriented to person, place, and time.  Skin: Skin is warm and dry. No rash noted. She is not diaphoretic. No erythema. No pallor.  Psychiatric: She has a normal mood and affect. Judgment and thought content normal. Her mood appears not anxious. Her affect is not angry, not blunt, not labile and not inappropriate. Her speech is delayed. Her speech is not rapid and/or pressured, not tangential and not slurred. She is slowed and withdrawn. She is not agitated, not aggressive, not hyperactive, not actively hallucinating and not combative. Cognition and memory are normal. She  does not exhibit a depressed mood. She is communicative. She is attentive.     Lab Results   Component Value Date   WBC 4.5 08/28/2013   HGB 12.7 08/28/2013   HCT 36.7 08/28/2013   PLT 213.0 08/28/2013   GLUCOSE 95 08/28/2013   CHOL 262* 08/28/2013   TRIG 185.0* 08/28/2013   HDL 66.10 08/28/2013   LDLDIRECT 172.6 08/28/2013   ALT 13 08/28/2013   AST 18 08/28/2013   NA 138 08/28/2013   K 3.4* 08/28/2013   CL 102 08/28/2013   CREATININE 1.0 08/28/2013   BUN 14 08/28/2013   CO2 31 08/28/2013   TSH 2.35 08/28/2013   HGBA1C 6.6* 08/28/2013       Assessment & Plan:

## 2013-09-09 ENCOUNTER — Telehealth: Payer: Self-pay | Admitting: Family Medicine

## 2013-09-09 ENCOUNTER — Encounter: Payer: Self-pay | Admitting: Family Medicine

## 2013-09-09 ENCOUNTER — Ambulatory Visit (INDEPENDENT_AMBULATORY_CARE_PROVIDER_SITE_OTHER): Payer: Medicare Other | Admitting: Family Medicine

## 2013-09-09 VITALS — BP 124/82 | HR 72 | Wt 137.0 lb

## 2013-09-09 DIAGNOSIS — M19019 Primary osteoarthritis, unspecified shoulder: Secondary | ICD-10-CM | POA: Diagnosis not present

## 2013-09-09 DIAGNOSIS — M19012 Primary osteoarthritis, left shoulder: Secondary | ICD-10-CM

## 2013-09-09 DIAGNOSIS — M67919 Unspecified disorder of synovium and tendon, unspecified shoulder: Secondary | ICD-10-CM | POA: Diagnosis not present

## 2013-09-09 DIAGNOSIS — M25512 Pain in left shoulder: Secondary | ICD-10-CM

## 2013-09-09 DIAGNOSIS — M25519 Pain in unspecified shoulder: Secondary | ICD-10-CM

## 2013-09-09 DIAGNOSIS — M75102 Unspecified rotator cuff tear or rupture of left shoulder, not specified as traumatic: Secondary | ICD-10-CM

## 2013-09-09 NOTE — Telephone Encounter (Signed)
Pt wants to know if she can go to her aerobics class 3 days a week with her shoulder pain?

## 2013-09-09 NOTE — Progress Notes (Signed)
I'm seeing this patient by the request  of:  Dr. Yetta Barre  CC: Left shoulder pain  HPI: Patient is a very pleasant 78 year old female coming in with left shoulder pain for 3 months duration. Patient does not remember any true injury. Patient feels that the pain is getting worse. Patient is a dull aching does have a clicking sensation I can't cause pain with certain movements. Patient states that the pain is specifically over one area of the shoulder and she can point to it on the top of her shoulder. Patient describes it is more of a dual aching sensation at rest. Patient has tried some over-the-counter medications with mild improvement. Patient is a aerobics class and has noticed more discomfort recently. Patient the severity of 8/10. This pain is waking her up at night.  Past medical, surgical, family and social history reviewed. Medications reviewed all in the electronic medical record.   Review of Systems: No headache, visual changes, nausea, vomiting, diarrhea, constipation, dizziness, abdominal pain, skin rash, fevers, chills, night sweats, weight loss, swollen lymph nodes, body aches, joint swelling, muscle aches, chest pain, shortness of breath, mood changes.   Objective:    Blood pressure 124/82, pulse 72, weight 137 lb (62.143 kg), SpO2 95.00%.   General: No apparent distress alert and oriented x3 mood and affect normal, dressed appropriately.  HEENT: Pupils equal, extraocular movements intact Respiratory: Patient's speak in full sentences and does not appear short of breath Cardiovascular: No lower extremity edema, non tender, no erythema Skin: Warm dry intact with no signs of infection or rash on extremities or on axial skeleton. Abdomen: Soft nontender Neuro: Cranial nerves II through XII are intact, neurovascularly intact in all extremities with 2+ DTRs and 2+ pulses. Lymph: No lymphadenopathy of posterior or anterior cervical chain or axillae bilaterally.  Gait normal with good  balance and coordination.  MSK: Non tender with full range of motion and good stability and symmetric strength and tone of elbows, wrist, hip, knee and ankles bilaterally.  Shoulder: Inspection reveals no abnormalities, atrophy or asymmetry. Patient is severely tender to palpation over the a.c. joint ROM is full in all planes. Try to 5 rotator cuff strength compared to the contralateral side Positive signs of impingement with negative Neer and Hawkin's tests, empty can sign. Speeds and Yergason's tests normal. No labral pathology noted with negative Obrien's, negative clunk and good stability. Normal scapular function observed. No painful arc and no drop arm sign. Positive crossover Positive apprehension sign   MSK US performed of: left shoulder This study was ordered, performed, and interpreted by Terrilee Files D.O.  Shoulder:   Supraspinatus:  Patient does have what appears to be a articular sided tear of the supraspinatus approximately 20% of the tendon. There is some hypoechoic changes resulting in effusion. No retraction noted.  Infraspinatus:  Appears normal on long and transverse views. Subscapularis:  Patient does have a small bursal side tear of the subscapularis with no retraction. Teres Minor:  Appears normal on long and transverse views. AC joint:  Positive geyser sign with moderate to severe osteoarthritic changes Glenohumeral Joint:  Appears normal without effusion. Glenoid Labrum:  Intact without visualized tears. Biceps Tendon:  Appears normal on long and transverse views, no fraying of tendon, tendon located in intertubercular groove, no subluxation with shoulder internal or external rotation. No increased power doppler signal. Impression: Partial rotator cuff tear of the supraspinatus as well as severe osteoarthritic a.c. joint.   Impression and Recommendations:     This  case required medical decision making of moderate complexity.

## 2013-09-09 NOTE — Patient Instructions (Signed)
Very nice to meet you I gave you an injection today I think will help I am giving you some exercises to do daily Ice 20 minutes 3 times a day can help Ibuprofen or tylenol can help.  Come back and see me again in 3 week. If not better I have other tricks.

## 2013-09-09 NOTE — Telephone Encounter (Signed)
Yes she may go to aerobics, maybe a good idea to take this week off Though.

## 2013-09-09 NOTE — Assessment & Plan Note (Signed)
Procedure: Real-time Ultrasound Guided Injection of left ac joint Device: GE Logiq E  Ultrasound guided injection is preferred based studies that show increased duration, increased effect, greater accuracy, decreased procedural pain, increased response rate with ultrasound guided versus blind injection.  Verbal informed consent obtained.  Time-out conducted.  Noted no overlying erythema, induration, or other signs of local infection.  Skin prepped in a sterile fashion.  Local anesthesia: Topical Ethyl chloride.  With sterile technique and under real time ultrasound guidance:  Joint visualized.  26g 1 inch needle inserted superior to inferior. Pictures taken for needle placement. Patient did have injection of 2 cc  of 0.5% Marcaine, and 1cc of Kenalog 40 mg/dL. Completed without difficulty  Pain  improvedsuggesting accurate placement of the medication.  Advised to call if fevers/chills, erythema, induration, drainage, or persistent bleeding.  Images permanently stored and available for review in the ultrasound unit.  Impression: Technically successful ultrasound guided injection.  Patient also given home exercise program I do think her pain is likely secondary to also rotator cuff syndrome the patient was adamant all her pain was over the a.c. joint. Patient given home exercise program, discussed over-the-counter medication and icing protocol. Patient will come back in 3 weeks.

## 2013-09-09 NOTE — Assessment & Plan Note (Signed)
I do believe patient does have rotator cuff syndrome and ultrasound today addition the patient does have partial tearing of the subscapularis as well as the supraspinatus. There is no retraction of the muscle itself.  Home exercise program given Discuss over-the-counter anti-inflammatories  Patient will followup in 3 week, if still in pain would consider intra-articular glenohumeral joint injection under ultrasound guidance.

## 2013-09-09 NOTE — Assessment & Plan Note (Signed)
I think patient's shoulder pain is multifactorial. Patient does have good a.c. joint osteoarthritic changes on ultrasound today. In addition this patient does have rotator cuff tendinopathy with partial tearing and significant subacromial bursitis. Please see individual problems for plan.

## 2013-09-13 ENCOUNTER — Ambulatory Visit (INDEPENDENT_AMBULATORY_CARE_PROVIDER_SITE_OTHER): Payer: Medicare Other | Admitting: Neurology

## 2013-09-13 ENCOUNTER — Encounter: Payer: Self-pay | Admitting: Neurology

## 2013-09-13 VITALS — BP 150/69 | HR 63 | Temp 97.5°F | Ht 66.0 in | Wt 137.0 lb

## 2013-09-13 DIAGNOSIS — E785 Hyperlipidemia, unspecified: Secondary | ICD-10-CM | POA: Diagnosis not present

## 2013-09-13 DIAGNOSIS — F0280 Dementia in other diseases classified elsewhere without behavioral disturbance: Secondary | ICD-10-CM

## 2013-09-13 DIAGNOSIS — E119 Type 2 diabetes mellitus without complications: Secondary | ICD-10-CM

## 2013-09-13 NOTE — Patient Instructions (Addendum)
I think overall you are doing fairly well but I do want to suggest a few things today:  Remember to drink plenty of fluid, eat healthy meals and do not skip any meals. Try to eat protein with a every meal and eat a healthy snack such as fruit or nuts in between meals. Try to keep a regular sleep-wake schedule and try to exercise daily, particularly in the form of walking, 20-30 minutes a day, if you can.   Engage in social activities in your community and with your family and try to keep up with current events by reading the newspaper or watching the news.   As far as your medications are concerned, I would like to suggest no changes at this time. We may introduce a memory medication in the near future.    As far as diagnostic testing: brain scan: MRI and blood work.  I would like to see you back in 3 months, sooner if we need to. Please call us with any interim questions, concerns, problems, updates or refill requests.  Please also call us for any test results so we can go over those with you on the phone. Brett Canales is my clinical assistant and will answer any of your questions and relay your messages to me and also relay most of my messages to you.  Our phone number is [phone]. We also have an after hours call service for urgent matters and there is a physician on-call for urgent questions. For any emergencies you know to call 911 or go to the nearest emergency room.

## 2013-09-13 NOTE — Progress Notes (Signed)
Subjective:    Patient ID: Katelyn Sparks is a 78 y.o. female.  HPI  Katelyn Foley, MD, PhD [facility] [address], [address] [address] [place], [place] [address]  Dear Dr. Yetta Barre,  I saw your patient, Katelyn Sparks, upon your kind request in my neurologic clinic today for initial consultation of her memory loss. The patient is accompanied by her friend, Katelyn Sparks, today. As you know, Katelyn Sparks is a very pleasant 78 year old right-handed woman with an underlying medical history of diabetes, reflux disease, and left shoulder pain, who has had problems with her memory for the past year. She has had forgetfulness, misplacing thing, she gets frustrated. She lives in her home, she is divorced and her 76 yo GS lives with her. This causes her stress at times. She drives, and has not gotten lost driving. She has no VH or AH, no SI/HI, no paranoid delusions, no Hx of inappropriate behavior. Her father had dementia and died at the age of 20. He mother lived to be 51 and had no dementia. She had a total of 13 siblings, 3 died in childhood. No siblings with memory loss.  She has not been on any dementia medications. She denies depression or anxiety.  She has not been on an antidepressant.  The patient denies prior TIA or stroke symptoms, such as sudden onset of one sided weakness, numbness, tingling, slurring of speech or droopy face, hearing loss, tinnitus, diplopia or visual field cut or monocular loss of vision, and denies recurrent headaches. No falls, no head injury.  Of note, the patient denies snoring, and there is no report of witnessed apneas or choking sensations while asleep.  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  I reviewed blood work from 08/28/13 from your office, which showed N CBC, N CMP and HbA1c of 6.6 and elevated cholesterol at 262 and LDL of 172.   Her Past Medical History Is Significant For: Past Medical History  Diagnosis Date  . Osteoarthrosis,  unspecified whether generalized or localized, unspecified site   . Unspecified hemorrhoids without mention of complication   . Flatulence, eructation, and gas pain   . Esophageal reflux   . Blood in stool   . Esophageal reflux     Her Past Surgical History Is Significant For: Past Surgical History  Procedure Laterality Date  . Abdominal hysterectomy      Her Family History Is Significant For: Family History  Problem Relation Age of Onset  . Diabetes    . Hypertension    . Colon cancer Neg Hx   . Cancer Neg Hx   . Early death Neg Hx   . Heart disease Neg Hx   . Hyperlipidemia Neg Hx   . Kidney disease Neg Hx   . Learning disabilities Neg Hx   . Stroke Neg Hx   . Diabetes Mother   . Diabetes Sister   . Diabetes Brother     Her Social History Is Significant For: History   Social History  . Marital Status: Widowed    Spouse Name: N/A    Number of Children: 2  . Years of Education: 12   Occupational History  . Retired    Social History Main Topics  . Smoking status: Never Smoker   . Smokeless tobacco: Never Used  . Alcohol Use: No  . Drug Use: No  . Sexual Activity: Not Currently   Other Topics Concern  . None   Social  History Narrative  . None    Her Allergies Are:  No Known Allergies:   Her Current Medications Are:  Outpatient Encounter Prescriptions as of 09/13/2013  Medication Sig Dispense Refill  . atorvastatin (LIPITOR) 80 MG tablet Take 1 tablet (80 mg total) by mouth daily.  90 tablet  3  . calcium carbonate (TUMS - DOSED IN MG ELEMENTAL CALCIUM) 500 MG chewable tablet Chew 2 tablets by mouth daily.      Marland Kitchen omeprazole (PRILOSEC) 40 MG capsule Take 1 capsule (40 mg total) by mouth daily.  90 capsule  3  . ondansetron (ZOFRAN ODT) 4 MG disintegrating tablet Take 1 tablet (4 mg total) by mouth every 8 (eight) hours as needed for nausea.  50 tablet  1   No facility-administered encounter medications on file as of 09/13/2013.  :  Review of Systems:   Out of a complete 14 point review of systems, all are reviewed and negative with the exception of these symptoms as listed below:  Review of Systems  Constitutional:       Weight loss  Respiratory: Positive for cough.   Gastrointestinal: Positive for diarrhea, constipation and blood in stool.  Musculoskeletal: Positive for joint swelling.       Joint pain  Skin:       moles  Neurological: Positive for numbness.       Memory loss Confusion Passing out  Hematological:       Easy bleeding anemia  Psychiatric/Behavioral:       Insomnia Sleepiness Depression Not enough sleep Change in appetite Racing thoughts    Objective:  Neurologic Exam  Physical Exam Physical Examination:   Filed Vitals:   09/13/13 1023  BP: 150/69  Pulse: 63  Temp: 97.5 F (36.4 C)    General Examination: The patient is a very pleasant 78 y.o. female in no acute distress. She is calm and cooperative with the exam. She denies Auditory Hallucinations and Visual Hallucinations. She is well groomed and situated in a chair.   HEENT: Normocephalic, atraumatic, pupils are equal, round and reactive to light and accommodation. Funduscopic exam is normal with sharp disc margins noted. Extraocular tracking shows mild saccadic breakdown without nystagmus noted. Hearing is impaired mildly. Tympanic membranes are clear bilaterally. Face is symmetric with no facial masking and normal facial sensation. There is no lip, neck or jaw tremor. Neck is mildly rigid with intact passive ROM. There are no carotid bruits on auscultation. Oropharynx exam reveals mild mouth dryness. Moderate airway crowding is noted. Mallampati is class III. Tongue protrudes centrally and palate elevates symmetrically.    Chest: is clear to auscultation without wheezing, rhonchi or crackles noted.  Heart: sounds are regular and normal without murmurs, rubs or gallops noted.   Abdomen: is soft, non-tender and non-distended with normal bowel  sounds appreciated on auscultation.  Extremities: There is no pitting edema in the distal lower extremities bilaterally. Pedal pulses are intact.   Skin: is warm and dry with no trophic changes noted. Age-related changes are noted on the skin.   Musculoskeletal: exam reveals no obvious joint deformities, tenderness or joint swelling or erythema.   Neurologically:  Mental status: The patient is awake and alert, paying good  attention. She is able to partially provide the history. Her friend provides some details. She is oriented to: person, place, situation, day of week and year. Her memory, attention, language and knowledge are impaired. There is no aphasia, agnosia, apraxia or anomia. There is a mild degree of  bradyphrenia. Speech is mildly hypophonic with no dysarthria noted. Mood is congruent and affect is normal.  Her MMSE (Mini-Mental state exam) score is 21/30.  CDT (Clock Drawing Test) score is 4/4.  AFT (Animal Fluency Test) score is 10.   Cranial nerves are as described above under HEENT exam. In addition, shoulder shrug is normal with equal shoulder height noted.  Motor exam: Normal bulk, and strength for age is noted. Tone is not rigid with absence of cogwheeling in the extremities. There is overall no significant bradykinesia. There is no drift or rebound. There is no tremor.   Romberg is negative. Reflexes are 1+ in the upper extremities and 1+ in the lower extremities. Toes are downgoing bilaterally. Fine motor skills: Finger taps, hand movements, and rapid alternating patting are not impaired bilaterally. Foot taps and foot agility are not impaired bilaterally.   Cerebellar testing shows no dysmetria or intention tremor on finger to nose testing. Heel to shin is unremarkable. There is no truncal or gait ataxia.   Sensory exam is intact to light touch, pinprick, vibration, temperature sense and proprioception in the upper and lower extremities.   Gait, station and balance: She  stands up from the seated position with no difficulty and needs no assistance. No veering to one side is noted. No leaning to one side. Posture is mildly stooped, age-appropriate. Stance is narrow-based. She turns en bloc. Tandem walk is not easily possible. Balance is mildly impaired.   Assessment and Plan:   In summary, HADAS JESSOP is a very pleasant 78 y.o. female with an underlying medical history of diabetes, reflux disease, and left shoulder pain, who has a 1 year history of memory loss. Her history and physical exam are keeping with memory loss likely beyond MCI, possibly early dementia of the Alzheimer's type.   I had a long chat with the patient and her friend about my findings and the diagnosis of memory loss and dementia, its prognosis and treatment options. Implications of diagnosis explained at length with the patient and her friend. It would be good to have her children involved in her visits down the road. One son lives locally, one in [place], [place]. We talked about medical treatments and non-pharmacological approaches. We talked about maintaining a healthy lifestyle in general and staying active mentally and physically. I encouraged the patient to eat healthy, exercise daily and keep well hydrated, to keep a scheduled bedtime and wake time routine, to not skip any meals and eat healthy snacks in between meals and to have protein with every meal. I stressed the importance of regular exercise, within of course the patient's own mobility limitations. I encouraged the patient to keep up with current events by reading the news paper or watching the news and to do word puzzles, or if feasible, to go on [email or web address].   As far as further diagnostic testing is concerned, I suggested the following: MRI brain without contrast, and I would like to pursue formal memory testing in the form of neuropsychological evaluation.  As far as medications are concerned, I recommended the following at this time: no  change.  I answered all their questions today and the patient and her friend were in agreement with the above outlined plan. I would like to see the patient back in 3 months, sooner if the need arises and encouraged her to call with any interim questions, concerns, problems, updates and test results.   Thank you very much for allowing me to participate in the  care of this nice patient. If I can be of any further assistance to you please do not hesitate to call me at [phone].  Sincerely,   Star Age, MD, PhD

## 2013-09-14 LAB — TSH: TSH: 2.32 u[IU]/mL (ref 0.450–4.500)

## 2013-09-14 LAB — SEDIMENTATION RATE: Sed Rate: 13 mm/hr (ref 0–40)

## 2013-09-23 ENCOUNTER — Encounter: Payer: Self-pay | Admitting: *Deleted

## 2013-09-27 NOTE — Progress Notes (Signed)
Quick Note:  Please advise patient that her recent blood work showed normal thyroid screening, normal sedimentation rate, her ammonia level was elevated at 113 with a normal upper cut off of 87. This is not severely elevated and may indicate chronic medication use with clearance through liver. This may be something we want to recheck down the road again. Otherwise no action needs to be taken at this time. Please remind patient to keep any upcoming appointments or tests and to call us with any interim questions, concerns, problems or updates. Thanks,  Huston Foley, MD, PhD    ______

## 2013-09-27 NOTE — Progress Notes (Signed)
Quick Note:  Called patient and left VM message of blood work shared by Dr Teofilo Pod findings noted below ______

## 2013-09-30 ENCOUNTER — Ambulatory Visit (INDEPENDENT_AMBULATORY_CARE_PROVIDER_SITE_OTHER): Payer: Medicare Other | Admitting: Family Medicine

## 2013-09-30 ENCOUNTER — Encounter: Payer: Self-pay | Admitting: Family Medicine

## 2013-09-30 VITALS — BP 122/72 | HR 72

## 2013-09-30 DIAGNOSIS — M19019 Primary osteoarthritis, unspecified shoulder: Secondary | ICD-10-CM | POA: Diagnosis not present

## 2013-09-30 DIAGNOSIS — M19012 Primary osteoarthritis, left shoulder: Secondary | ICD-10-CM

## 2013-09-30 NOTE — Patient Instructions (Signed)
I am glad you are doing better Continue exercises including the 3 new ones I gave you today 3 times a week.  If you have pain I can always send you to therapy Come back again in 6 weeks if needed.

## 2013-09-30 NOTE — Progress Notes (Signed)
  CC: Left shoulder pain follow up  HPI: Patient is a very pleasant 78 year old female coming follow up of left shoulder pain. Patient was diagnosed with a.c. joint arthritis. Patient also did have rotator cuff syndrome left shoulder. At first visit patient did have injection of the a.c. joint under ultrasound guidance and did very well. Patient states that she is approximately 80% better. Patient is doing the exercises but not really basis. Patient actually forgot that she had them. Overall she is doing very well but still has some mild pain when she reaches across her body. Denies any radiation down the arm or any numbness. Able to do all activities of daily living without any significant discomfort.  Pain is no longer waking her up at night.  Past medical, surgical, family and social history reviewed. Medications reviewed all in the electronic medical record.   Review of Systems: No headache, visual changes, nausea, vomiting, diarrhea, constipation, dizziness, abdominal pain, skin rash, fevers, chills, night sweats, weight loss, swollen lymph nodes, body aches, joint swelling, muscle aches, chest pain, shortness of breath, mood changes.   Objective:    Blood pressure 122/72, pulse 72, SpO2 99.00%.   General: No apparent distress alert and oriented x3 mood and affect normal, dressed appropriately.  HEENT: Pupils equal, extraocular movements intact Respiratory: Patient's speak in full sentences and does not appear short of breath Cardiovascular: No lower extremity edema, non tender, no erythema Skin: Warm dry intact with no signs of infection or rash on extremities or on axial skeleton. Abdomen: Soft nontender Neuro: Cranial nerves II through XII are intact, neurovascularly intact in all extremities with 2+ DTRs and 2+ pulses. Lymph: No lymphadenopathy of posterior or anterior cervical chain or axillae bilaterally.  Gait normal with good balance and coordination.  MSK: Non tender with full  range of motion and good stability and symmetric strength and tone of elbows, wrist, hip, knee and ankles bilaterally.  Shoulder: Inspection reveals no abnormalities, atrophy or asymmetry. Patient is severely tender to palpation over the a.c. joint ROM is full in all planes. 5/5 rotator cuff strength compared to the contralateral side Negative signs of impingement with negative Neer and Hawkin's tests, empty can sign. Speeds and Yergason's tests normal. No labral pathology noted with negative Obrien's, negative clunk and good stability. Normal scapular function observed. No painful arc and no drop arm sign. Positive crossover Positive apprehension sign   Impression and Recommendations:     This case required medical decision making of moderate complexity.

## 2013-09-30 NOTE — Assessment & Plan Note (Signed)
Patient responded very well to intra-articular cortisone injection. Encourage her to continue exercises 3 times a week. Patient was given theraband today to including do her exercises. Discussed continuing icing protocol. Patient does have some significant dementia at baseline and patient's friend who brought her has written down the details and will go over to her house 3 times a week to help. Patient encouraged to come back again in 6 weeks continuing to have pain. We can consider to do repeat steroid injection every 3-4 months if necessary.

## 2013-10-01 ENCOUNTER — Ambulatory Visit (INDEPENDENT_AMBULATORY_CARE_PROVIDER_SITE_OTHER): Payer: Medicare Other | Admitting: Gastroenterology

## 2013-10-01 ENCOUNTER — Encounter: Payer: Self-pay | Admitting: Gastroenterology

## 2013-10-01 VITALS — BP 120/80 | HR 69 | Ht 66.0 in | Wt 134.4 lb

## 2013-10-01 DIAGNOSIS — R410 Disorientation, unspecified: Secondary | ICD-10-CM

## 2013-10-01 DIAGNOSIS — K219 Gastro-esophageal reflux disease without esophagitis: Secondary | ICD-10-CM | POA: Diagnosis not present

## 2013-10-01 NOTE — Progress Notes (Addendum)
This is a recommended 78 year old black female accompanied by a friend.  She apparently has had some nausea and reflux symptoms completely cured by omeprazole 40 mg a day.  She denies dysphagia, further nausea, emesis, or lower gastrointestinal or hepatobiliary complaints.  Her records are somewhat confusing, and she is been seen multiple physicians over the last several months.  Her care is apparently run by a friend and by her grandson.  She recent saw Dr. Sanda Linger, and review of labs, urinalysis, all are negative.  She currently is using when necessary Zofran when needed.  She denies abuse of alcohol, cigarettes, or NSAIDs.  There is no history of melena or hematochezia.  I did an endoscopy  2 years ago that was unremarkable as was colonoscopy.  She had a large perianal skin tag on colon exam noted.  Current Medications, Allergies, Past Medical History, Past Surgical History, Family History and Social History were reviewed in [employer] record.  ROS: All systems were reviewed and are negative unless otherwise stated in the HPI.Poor Historian and her friend unsure of issues          Physical Exam: Somewhat confused patient in no acute distress.  Blood pressure 120/80, pulse 69 and weight 134.  Chest is clear and she is in a regular rhythm without murmurs gallops or rubs.  Her abdomen shows no organomegaly, masses or tenderness.  Bowel sounds are normal.  Mental status is not normal.  Peripheral extremities are unremarkable.    Assessment and Plan: Acid reflux improved on PPI therapy which I have asked her to continue.  I do not think this patient needs endoscopy.  I would agree that she does need head CT scan or MRI exam for her what appears to be advancing dementia.  She should take her other medications as per primary care.    Cc DR Sanda Linger

## 2013-10-01 NOTE — Patient Instructions (Signed)
We have sent refills for a year on your omeprazole take this everyday.  Call your pharmacy for further refills   Gastroesophageal Reflux Disease, Adult Gastroesophageal reflux disease (GERD) happens when acid from your stomach flows up into the esophagus. When acid comes in contact with the esophagus, the acid causes soreness (inflammation) in the esophagus. Over time, GERD may create small holes (ulcers) in the lining of the esophagus. CAUSES   Increased body weight. This puts pressure on the stomach, making acid rise from the stomach into the esophagus.  Smoking. This increases acid production in the stomach.  Drinking alcohol. This causes decreased pressure in the lower esophageal sphincter (valve or ring of muscle between the esophagus and stomach), allowing acid from the stomach into the esophagus.  Late evening meals and a full stomach. This increases pressure and acid production in the stomach.  A malformed lower esophageal sphincter. Sometimes, no cause is found. SYMPTOMS   Burning pain in the lower part of the mid-chest behind the breastbone and in the mid-stomach area. This may occur twice a week or more often.  Trouble swallowing.  Sore throat.  Dry cough.  Asthma-like symptoms including chest tightness, shortness of breath, or wheezing. DIAGNOSIS  Your caregiver may be able to diagnose GERD based on your symptoms. In some cases, X-rays and other tests may be done to check for complications or to check the condition of your stomach and esophagus. TREATMENT  Your caregiver may recommend over-the-counter or prescription medicines to help decrease acid production. Ask your caregiver before starting or adding any new medicines.  HOME CARE INSTRUCTIONS   Change the factors that you can control. Ask your caregiver for guidance concerning weight loss, quitting smoking, and alcohol consumption.  Avoid foods and drinks that make your symptoms worse, such as:  Caffeine or  alcoholic drinks.  Chocolate.  Peppermint or mint flavorings.  Garlic and onions.  Spicy foods.  Citrus fruits, such as oranges, lemons, or limes.  Tomato-based foods such as sauce, chili, salsa, and pizza.  Fried and fatty foods.  Avoid lying down for the 3 hours prior to your bedtime or prior to taking a nap.  Eat small, frequent meals instead of large meals.  Wear loose-fitting clothing. Do not wear anything tight around your waist that causes pressure on your stomach.  Raise the head of your bed 6 to 8 inches with wood blocks to help you sleep. Extra pillows will not help.  Only take over-the-counter or prescription medicines for pain, discomfort, or fever as directed by your caregiver.  Do not take aspirin, ibuprofen, or other nonsteroidal anti-inflammatory drugs (NSAIDs). SEEK IMMEDIATE MEDICAL CARE IF:   You have pain in your arms, neck, jaw, teeth, or back.  Your pain increases or changes in intensity or duration.  You develop nausea, vomiting, or sweating (diaphoresis).  You develop shortness of breath, or you faint.  Your vomit is green, yellow, black, or looks like coffee grounds or blood.  Your stool is red, bloody, or black. These symptoms could be signs of other problems, such as heart disease, gastric bleeding, or esophageal bleeding. MAKE SURE YOU:   Understand these instructions.  Will watch your condition.  Will get help right away if you are not doing well or get worse. Document Released: 09/07/2005 Document Revised: 02/20/2012 Document Reviewed: 06/17/2011 [facility] Patient Information 2014 [place], [place]. CC:  Sanda Linger MD

## 2013-10-02 ENCOUNTER — Telehealth: Payer: Self-pay | Admitting: Gastroenterology

## 2013-10-04 NOTE — Telephone Encounter (Signed)
Pt's son [name] called on 10/02/13 to question pt's OV. Explained to kevin that Dr Jarold Motto is off and I will have to discuss it with him later. [name] states his mother in law accompanied his mom to the visit  She and [name] felt the visit was wasted and found out nothing,  After reviewing the office notes from 10/01/13, pt was seen for weight loss and nausea; her primary complaints.. Pt had already been to her PCP, Dr Yetta Barre who had referred her to other doctors . She has been seen for her shoulder problems by Dr Antoine Primas. She was seen for her memory problems by Neurologist, Dr Frances Furbish. Dr Yetta Barre had given pt Zofran for nausea, so by the time she saw Dr Jarold Motto, she was better. Her Genella Rife is better on a PPI, and she had no complaints about her appetite  Informed son of this and he asked about an ER visit in August where there is a question of vomiting blood and another note in Dr Yetta Barre; note about rectal bleeding. Pt feels if I can research the chart, why didn't the doc? Offered him an appt with another doctor in our practice as suggested by Dr Jarold Motto or he can go to another practice. He asked if I can find him another doc her; will call him back.

## 2013-10-07 NOTE — Telephone Encounter (Signed)
Informed son [name] we have an appt with Dr Leone Payor, but the earliest one is 11/12/13; does he feel that is too far away/ I asked what her concerns were now that her nausea and GERD are better. [name] reports there was an episode in the ER when his mom vomited red "stuff". After questioning, pt admitted she had eaten a pizza earlier; he is concerned she may have an upper GI BLeed. He will call me back about the appt. Also, I informed him we could have his mom see a APP and then follow up with Dr Leone Payor if she needs to be seen immediately. [name] will call back if he has problems or questions.

## 2013-10-07 NOTE — Telephone Encounter (Signed)
Iva Boop, MD Linna Hoff, RN            Sure- not sure how soon I can see her and would be best if son accompanies her      lmom for son,Kevin to call back; tentatively scheduled for 11/12/13 the earliest I can find.

## 2013-10-08 ENCOUNTER — Inpatient Hospital Stay: Admission: RE | Admit: 2013-10-08 | Payer: Medicare Other | Source: Ambulatory Visit

## 2013-10-28 ENCOUNTER — Ambulatory Visit: Payer: Medicare Other | Admitting: Internal Medicine

## 2013-10-29 ENCOUNTER — Telehealth: Payer: Self-pay | Admitting: *Deleted

## 2013-10-29 NOTE — Telephone Encounter (Signed)
Pt walked in and states she has to be seen; she has an appt tomorrow with Dr Posey Rea, but wants [place] to do something for her constipation. She reports she had been constipated for a week and took one dose of Miralax about 3 days ago and the only result was little balls. Pt is switching from Dr Jarold Motto to Dr Leone Payor, but her appt is on 11/12/13. Could not find Dr Leone Payor, but spoke with Mike Gip, PA. Per Amy, have pt take 3 doses of Miralax today, each in a glass of water. If she doesn't get cleaned out, take 3 doses tomorrow. Once cleaned out, take 1 dose of Miralax daily. Wrote down instructions with times for doses and gave it the pt with samples of [place]

## 2013-10-29 NOTE — Telephone Encounter (Signed)
Spoke with pt's son to informed him of pt's confusion today. He states she tried to get an earlier appt with the neurologist and the earliest appt is in February. Informed him I was concerned with her driving; he will call her.

## 2013-10-29 NOTE — Telephone Encounter (Signed)
Tried to call pt's son, [name] about today's visit; pt seemed very anxious and confused with my instructions. We went over the dosing of Miralax several times. She was also confused about her appt tomorrow. Apparently she went into PCP office downstairs c/o of her bowel problems and was scheduled with Dr Posey Rea tomorrow. Spoke with the person who scheduled the appt and she has been in their ofc for the past 2 days. She lost the card given yesterday and he gave her another one before she came up here.

## 2013-10-30 ENCOUNTER — Encounter: Payer: Self-pay | Admitting: Internal Medicine

## 2013-10-30 ENCOUNTER — Ambulatory Visit (INDEPENDENT_AMBULATORY_CARE_PROVIDER_SITE_OTHER): Payer: Medicare Other | Admitting: Internal Medicine

## 2013-10-30 ENCOUNTER — Ambulatory Visit (INDEPENDENT_AMBULATORY_CARE_PROVIDER_SITE_OTHER)
Admission: RE | Admit: 2013-10-30 | Discharge: 2013-10-30 | Disposition: A | Discharge status: Home / Self Care | Payer: Medicare Other | Source: Ambulatory Visit | Attending: Internal Medicine | Admitting: Internal Medicine

## 2013-10-30 VITALS — BP 120/88 | HR 80 | Temp 98.2°F | Resp 16 | Wt 130.0 lb

## 2013-10-30 DIAGNOSIS — K5900 Constipation, unspecified: Secondary | ICD-10-CM | POA: Diagnosis not present

## 2013-10-30 DIAGNOSIS — E785 Hyperlipidemia, unspecified: Secondary | ICD-10-CM | POA: Diagnosis not present

## 2013-10-30 MED ORDER — VITAMIN D 1000 UNITS PO TABS: 1000.0000 [IU] | ORAL_TABLET | Freq: Every day | ORAL | Status: DC

## 2013-10-30 MED ORDER — LINACLOTIDE 290 MCG PO CAPS: 290.0000 ug | ORAL_CAPSULE | Freq: Every day | ORAL | Status: DC

## 2013-10-30 NOTE — Assessment & Plan Note (Signed)
Hold lipitor due to constipation

## 2013-10-30 NOTE — Progress Notes (Signed)
Pre visit review using our clinic review tool, if applicable. No additional management support is needed unless otherwise documented below in the visit note. 

## 2013-10-30 NOTE — Progress Notes (Signed)
  Subjective:    HPI   C/o constipation x months. She reports she had been constipated worse for a week and took one dose of Miralax about 3 days ago and the only result was little balls. Pt is switching from Dr Jarold Motto to Dr Leone Payor, but her appt is on 11/12/13.  Wt Readings from Last 3 Encounters:  10/30/13 130 lb (58.968 kg)  10/01/13 134 lb 6 oz (60.952 kg)  09/13/13 137 lb (62.143 kg)   BP Readings from Last 3 Encounters:  10/30/13 120/88  10/01/13 120/80  09/30/13 122/72      Review of Systems  Constitutional: Negative for chills, activity change, appetite change, fatigue and unexpected weight change.  HENT: Negative for congestion, mouth sores and sinus pressure.   Eyes: Negative for visual disturbance.  Respiratory: Negative for cough and chest tightness.   Gastrointestinal: Positive for constipation. Negative for nausea, vomiting, abdominal pain, abdominal distention and anal bleeding.  Genitourinary: Negative for frequency, difficulty urinating and vaginal pain.  Musculoskeletal: Negative for back pain and gait problem.  Skin: Negative for pallor and rash.  Neurological: Negative for dizziness, tremors, weakness, numbness and headaches.  Psychiatric/Behavioral: Negative for confusion and sleep disturbance.       Objective:   Physical Exam  Constitutional: She appears well-developed. No distress.  HENT:  Head: Normocephalic.  Right Ear: External ear normal.  Left Ear: External ear normal.  Nose: Nose normal.  Mouth/Throat: Oropharynx is clear and moist.  Eyes: Conjunctivae are normal. Pupils are equal, round, and reactive to light. Right eye exhibits no discharge. Left eye exhibits no discharge.  Neck: Normal range of motion. Neck supple. No JVD present. No tracheal deviation present. No thyromegaly present.  Cardiovascular: Normal rate, regular rhythm and normal heart sounds.   Pulmonary/Chest: No stridor. No respiratory distress. She has no wheezes.   Abdominal: Soft. Bowel sounds are normal. She exhibits no distension and no mass. There is no tenderness. There is no rebound and no guarding.  Musculoskeletal: She exhibits no edema and no tenderness.  Lymphadenopathy:    She has no cervical adenopathy.  Neurological: She displays normal reflexes. No cranial nerve deficit. She exhibits normal muscle tone. Coordination normal.  Skin: No rash noted. No erythema.  Psychiatric: She has a normal mood and affect. Her behavior is normal. Judgment and thought content normal.    TSH nl      Assessment & Plan:

## 2013-10-30 NOTE — Assessment & Plan Note (Signed)
Abd Xray Hold calcium and Lipitor Linzess prn F/u w/Dr Yetta Barre in 2 wks

## 2013-11-11 ENCOUNTER — Telehealth: Payer: Self-pay | Admitting: Internal Medicine

## 2013-11-11 ENCOUNTER — Telehealth: Payer: Self-pay | Admitting: Neurology

## 2013-11-11 ENCOUNTER — Ambulatory Visit (INDEPENDENT_AMBULATORY_CARE_PROVIDER_SITE_OTHER): Payer: Medicare Other | Admitting: Internal Medicine

## 2013-11-11 ENCOUNTER — Encounter: Payer: Self-pay | Admitting: Internal Medicine

## 2013-11-11 VITALS — BP 128/64 | HR 60 | Temp 98.2°F | Resp 16 | Ht 66.0 in | Wt 129.8 lb

## 2013-11-11 DIAGNOSIS — IMO0001 Reserved for inherently not codable concepts without codable children: Secondary | ICD-10-CM

## 2013-11-11 DIAGNOSIS — R202 Paresthesia of skin: Secondary | ICD-10-CM

## 2013-11-11 DIAGNOSIS — R209 Unspecified disturbances of skin sensation: Secondary | ICD-10-CM | POA: Diagnosis not present

## 2013-11-11 DIAGNOSIS — Z1231 Encounter for screening mammogram for malignant neoplasm of breast: Secondary | ICD-10-CM

## 2013-11-11 NOTE — Telephone Encounter (Signed)
Pt stated, as she left the office today, that she didn't know if any meds were prescribed for her from today's visit that she will need to pick up from the pharmacy. Please advise.  JSL

## 2013-11-11 NOTE — Progress Notes (Signed)
Pre visit review using our clinic review tool, if applicable. No additional management support is needed unless otherwise documented below in the visit note. 

## 2013-11-11 NOTE — Progress Notes (Signed)
Subjective:    Patient ID: Katelyn Sparks, female    DOB: 1935/04/11, 78 y.o.   MRN: [id number]  Diabetes She presents for her follow-up diabetic visit. She has type 2 diabetes mellitus. Her disease course has been stable. There are no hypoglycemic associated symptoms. Pertinent negatives for hypoglycemia include no dizziness, headaches, seizures, speech difficulty or tremors. Pertinent negatives for diabetes include no blurred vision, no chest pain, no fatigue, no foot paresthesias, no foot ulcerations, no polydipsia, no polyphagia, no polyuria, no visual change, no weakness and no weight loss. There are no hypoglycemic complications. Symptoms are stable. There are no diabetic complications. Current diabetic treatment includes diet. She is compliant with treatment most of the time. Her weight is stable. She is following a generally healthy diet. Meal planning includes avoidance of concentrated sweets. She has not had a previous visit with a dietician. She participates in exercise intermittently. There is no change in her home blood glucose trend. An ACE inhibitor/angiotensin II receptor blocker is not being taken. She does not see a podiatrist.Eye exam is not current.      Review of Systems  Constitutional: Negative.  Negative for fever, chills, weight loss, diaphoresis, activity change, appetite change, fatigue and unexpected weight change.  HENT: Negative.   Eyes: Negative.  Negative for blurred vision.  Respiratory: Negative.  Negative for cough, choking, chest tightness, wheezing and stridor.   Cardiovascular: Negative.  Negative for chest pain, palpitations and leg swelling.  Gastrointestinal: Negative.  Negative for nausea, vomiting, abdominal pain, diarrhea, constipation and blood in stool.  Endocrine: Negative.  Negative for polydipsia, polyphagia and polyuria.  Genitourinary: Negative.   Musculoskeletal: Negative.   Skin: Negative.   Allergic/Immunologic: Negative.   Neurological:  Positive for numbness (she has a strange crawling and numbness on her right arm and right chest). Negative for dizziness, tremors, seizures, syncope, facial asymmetry, speech difficulty, weakness, light-headedness and headaches.  Hematological: Negative.  Negative for adenopathy. Does not bruise/bleed easily.  Psychiatric/Behavioral: Negative.        Objective:   Physical Exam  Vitals reviewed. Constitutional: She is oriented to person, place, and time. She appears well-developed and well-nourished. No distress.  HENT:  Head: Normocephalic and atraumatic.  Mouth/Throat: Oropharynx is clear and moist. No oropharyngeal exudate.  Eyes: Conjunctivae are normal. Right eye exhibits no discharge. Left eye exhibits no discharge. No scleral icterus.  Neck: Normal range of motion. Neck supple. No JVD present. No tracheal deviation present. No thyromegaly present.  Cardiovascular: Normal rate, regular rhythm, normal heart sounds and intact distal pulses.  Exam reveals no gallop and no friction rub.   No murmur heard. Pulmonary/Chest: Effort normal and breath sounds normal. No stridor. No respiratory distress. She has no wheezes. She has no rales. She exhibits no tenderness.  Abdominal: Soft. Bowel sounds are normal. She exhibits no distension and no mass. There is no tenderness. There is no rebound and no guarding.  Musculoskeletal: Normal range of motion. She exhibits no edema and no tenderness.  Lymphadenopathy:    She has no cervical adenopathy.  Neurological: She is alert and oriented to person, place, and time. She has normal strength. She displays no atrophy, no tremor and normal reflexes. No cranial nerve deficit or sensory deficit. She exhibits normal muscle tone. She displays a negative Romberg sign. She displays no seizure activity. Coordination and gait normal. She displays no Babinski's sign on the right side. She displays no Babinski's sign on the left side.  Reflex Scores:  Tricep  reflexes are 0 on the right side and 0 on the left side.      Bicep reflexes are 0 on the right side and 0 on the left side.      Brachioradialis reflexes are 0 on the right side and 0 on the left side.      Patellar reflexes are 0 on the right side and 0 on the left side.      Achilles reflexes are 0 on the right side and 0 on the left side. Skin: Skin is warm and dry. No rash noted. She is not diaphoretic. No erythema. No pallor.     Lab Results  Component Value Date   WBC 4.5 08/28/2013   HGB 12.7 08/28/2013   HCT 36.7 08/28/2013   PLT 213.0 08/28/2013   GLUCOSE 95 08/28/2013   CHOL 262* 08/28/2013   TRIG 185.0* 08/28/2013   HDL 66.10 08/28/2013   LDLDIRECT 172.6 08/28/2013   ALT 13 08/28/2013   AST 18 08/28/2013   NA 138 08/28/2013   K 3.4* 08/28/2013   CL 102 08/28/2013   CREATININE 1.0 08/28/2013   BUN 14 08/28/2013   CO2 31 08/28/2013   TSH 2.320 09/13/2013   HGBA1C 6.6* 08/28/2013       Assessment & Plan:

## 2013-11-11 NOTE — Patient Instructions (Signed)
Type 2 Diabetes Mellitus, Adult Type 2 diabetes mellitus, often simply referred to as type 2 diabetes, is a long-lasting (chronic) disease. In type 2 diabetes, the pancreas does not make enough insulin (a hormone), the cells are less responsive to the insulin that is made (insulin resistance), or both. Normally, insulin moves sugars from food into the tissue cells. The tissue cells use the sugars for energy. The lack of insulin or the lack of normal response to insulin causes excess sugars to build up in the blood instead of going into the tissue cells. As a result, high blood sugar (hyperglycemia) develops. The effect of high sugar (glucose) levels can cause many complications. Type 2 diabetes was also previously called adult-onset diabetes but it can occur at any age.  RISK FACTORS  A person is predisposed to developing type 2 diabetes if someone in the family has the disease and also has one or more of the following primary risk factors:  Overweight.  An inactive lifestyle.  A history of consistently eating high-calorie foods. Maintaining a normal weight and regular physical activity can reduce the chance of developing type 2 diabetes. SYMPTOMS  A person with type 2 diabetes may not show symptoms initially. The symptoms of type 2 diabetes appear slowly. The symptoms include:  Increased thirst (polydipsia).  Increased urination (polyuria).  Increased urination during the night (nocturia).  Weight loss. This weight loss may be rapid.  Frequent, recurring infections.  Tiredness (fatigue).  Weakness.  Vision changes, such as blurred vision.  Fruity smell to your breath.  Abdominal pain.  Nausea or vomiting.  Cuts or bruises which are slow to heal.  Tingling or numbness in the hands or feet. DIAGNOSIS Type 2 diabetes is frequently not diagnosed until complications of diabetes are present. Type 2 diabetes is diagnosed when symptoms or complications are present and when blood  glucose levels are increased. Your blood glucose level may be checked by one or more of the following blood tests:  A fasting blood glucose test. You will not be allowed to eat for at least 8 hours before a blood sample is taken.  A random blood glucose test. Your blood glucose is checked at any time of the day regardless of when you ate.  A hemoglobin A1c blood glucose test. A hemoglobin A1c test provides information about blood glucose control over the previous 3 months.  An oral glucose tolerance test (OGTT). Your blood glucose is measured after you have not eaten (fasted) for 2 hours and then after you drink a glucose-containing beverage. TREATMENT   You may need to take insulin or diabetes medicine daily to keep blood glucose levels in the desired range.  You will need to match insulin dosing with exercise and healthy food choices. The treatment goal is to maintain the before meal blood sugar (preprandial glucose) level at 70 130 mg/dL. HOME CARE INSTRUCTIONS   Have your hemoglobin A1c level checked twice a year.  Perform daily blood glucose monitoring as directed by your caregiver.  Monitor urine ketones when you are ill and as directed by your caregiver.  Take your diabetes medicine or insulin as directed by your caregiver to maintain your blood glucose levels in the desired range.  Never run out of diabetes medicine or insulin. It is needed every day.  Adjust insulin based on your intake of carbohydrates. Carbohydrates can raise blood glucose levels but need to be included in your diet. Carbohydrates provide vitamins, minerals, and fiber which are an essential part of   a healthy diet. Carbohydrates are found in fruits, vegetables, whole grains, dairy products, legumes, and foods containing added sugars.    Eat healthy foods. Alternate 3 meals with 3 snacks.  Lose weight if overweight.  Carry a medical alert card or wear your medical alert jewelry.  Carry a 15 gram  carbohydrate snack with you at all times to treat low blood glucose (hypoglycemia). Some examples of 15 gram carbohydrate snacks include:  Glucose tablets, 3 or 4   Glucose gel, 15 gram tube  Raisins, 2 tablespoons (24 grams)  Jelly beans, 6  Animal crackers, 8  Regular pop, 4 ounces (120 mL)  Gummy treats, 9  Recognize hypoglycemia. Hypoglycemia occurs with blood glucose levels of 70 mg/dL and below. The risk for hypoglycemia increases when fasting or skipping meals, during or after intense exercise, and during sleep. Hypoglycemia symptoms can include:  Tremors or shakes.  Decreased ability to concentrate.  Sweating.  Increased heart rate.  Headache.  Dry mouth.  Hunger.  Irritability.  Anxiety.  Restless sleep.  Altered speech or coordination.  Confusion.  Treat hypoglycemia promptly. If you are alert and able to safely swallow, follow the 15:15 rule:  Take 15 20 grams of rapid-acting glucose or carbohydrate. Rapid-acting options include glucose gel, glucose tablets, or 4 ounces (120 mL) of fruit juice, regular soda, or low fat milk.  Check your blood glucose level 15 minutes after taking the glucose.  Take 15 20 grams more of glucose if the repeat blood glucose level is still 70 mg/dL or below.  Eat a meal or snack within 1 hour once blood glucose levels return to normal.    Be alert to polyuria and polydipsia which are early signs of hyperglycemia. An early awareness of hyperglycemia allows for prompt treatment. Treat hyperglycemia as directed by your caregiver.  Engage in at least 150 minutes of moderate-intensity physical activity a week, spread over at least 3 days of the week or as directed by your caregiver. In addition, you should engage in resistance exercise at least 2 times a week or as directed by your caregiver.  Adjust your medicine and food intake as needed if you start a new exercise or sport.  Follow your sick day plan at any time you  are unable to eat or drink as usual.  Avoid tobacco use.  Limit alcohol intake to no more than 1 drink per day for nonpregnant women and 2 drinks per day for men. You should drink alcohol only when you are also eating food. Talk with your caregiver whether alcohol is safe for you. Tell your caregiver if you drink alcohol several times a week.  Follow up with your caregiver regularly.  Schedule an eye exam soon after the diagnosis of type 2 diabetes and then annually.  Perform daily skin and foot care. Examine your skin and feet daily for cuts, bruises, redness, nail problems, bleeding, blisters, or sores. A foot exam by a caregiver should be done annually.  Brush your teeth and gums at least twice a day and floss at least once a day. Follow up with your dentist regularly.  Share your diabetes management plan with your workplace or school.  Stay up-to-date with immunizations.  Learn to manage stress.  Obtain ongoing diabetes education and support as needed.  Participate in, or seek rehabilitation as needed to maintain or improve independence and quality of life. Request a physical or occupational therapy referral if you are having foot or hand numbness or difficulties with grooming,   dressing, eating, or physical activity. SEEK MEDICAL CARE IF:   You are unable to eat food or drink fluids for more than 6 hours.  You have nausea and vomiting for more than 6 hours.  Your blood glucose level is over 240 mg/dL.  There is a change in mental status.  You develop an additional serious illness.  You have diarrhea for more than 6 hours.  You have been sick or have had a fever for a couple of days and are not getting better.  You have pain during any physical activity.  SEEK IMMEDIATE MEDICAL CARE IF:  You have difficulty breathing.  You have moderate to large ketone levels. MAKE SURE YOU:  Understand these instructions.  Will watch your condition.  Will get help right away if  you are not doing well or get worse. Document Released: 11/28/2005 Document Revised: 08/22/2012 Document Reviewed: 06/26/2012 Exit[employer] Patient Information 2014 [employer].  

## 2013-11-11 NOTE — Telephone Encounter (Signed)
I returned patient's call and left VM. Please call and provide more detailed information for [place] to share with MD. When did this begin? Have you had any illnesses? You have an appointment in February. Do you need to come in earlier or do you just need advice? The more detail we can give the MD the better she can respond to you. [phone].

## 2013-11-11 NOTE — Assessment & Plan Note (Signed)
I do not see anything abnormal on her exam or in her recent labs to explain this I have asked her to see neurology for further evaluation

## 2013-11-11 NOTE — Assessment & Plan Note (Signed)
She will continue with lifestyle modifications I have asked her to have an eye exam done

## 2013-11-12 ENCOUNTER — Telehealth: Payer: Self-pay | Admitting: Neurology

## 2013-11-12 ENCOUNTER — Encounter: Payer: Self-pay | Admitting: Internal Medicine

## 2013-11-12 ENCOUNTER — Ambulatory Visit (INDEPENDENT_AMBULATORY_CARE_PROVIDER_SITE_OTHER): Payer: Medicare Other | Admitting: Internal Medicine

## 2013-11-12 VITALS — BP 110/60 | HR 66 | Ht 66.0 in | Wt 130.0 lb

## 2013-11-12 DIAGNOSIS — R413 Other amnesia: Secondary | ICD-10-CM

## 2013-11-12 DIAGNOSIS — K5900 Constipation, unspecified: Secondary | ICD-10-CM | POA: Diagnosis not present

## 2013-11-12 DIAGNOSIS — K5909 Other constipation: Secondary | ICD-10-CM

## 2013-11-12 NOTE — Progress Notes (Signed)
   Subjective:    Patient ID: Katelyn Sparks, female    DOB: 1935/10/10, 78 y.o.   MRN: [id number]  HPI Patient is here for followup. She had seen my partner previously. She has had problems with vomiting and constipation and questions of rectal bleeding and vomiting blood but that really has not been borne out from what I can see by chart review. She says she struggles with constipation but she has memory loss issues and she's not really able to provide a reliable history. It looks like she was given samples of Linzess but she describes very watery urgent bowel movements with this and is not taking it every day. She also thinks MiraLax does the same thing. Abdominal film is been unremarkable within the last couple of weeks. Medications have been modified. She does not have [address] his, she lives with her grandson who is about 15 or 48. The friend that is with her, is the mother of the patient's daughter-in-law. She is unable to really check on her and her home on a daily basis. Wt Readings from Last 3 Encounters:  11/12/13 130 lb (58.968 kg)  11/11/13 129 lb 12 oz (58.854 kg)  10/30/13 130 lb (58.968 kg)    Medications, allergies, past medical history, past surgical history, family history and social history are reviewed and updated in the EMR.  Review of Systems As above    Objective:   Physical Exam General:  NAD Eyes:   anicteric Abdomen:  soft and nontender, BS+ Rectal: Female staff present, there are large anal tags. There is mild anal stenosis there is no stool in the vault and no mass. Ext:   no edema    Data Reviewed:  Primary care notes, gastroenterology notes from 2014. Neurology notes, 2011 colonoscopy Lab Results  Component Value Date   WBC 4.5 08/28/2013   HGB 12.7 08/28/2013   HCT 36.7 08/28/2013   MCV 85.0 08/28/2013   PLT 213.0 08/28/2013      Assessment & Plan:   1. Chronic constipation   2. Memory loss

## 2013-11-12 NOTE — Assessment & Plan Note (Signed)
I can't really tell what her bowel habits are based upon her history giving ability. It sounds like she has urgent loose stools with the Linzess which make sense. She believes the MiraLax though that as well which is far less likely. Her going to try daily MiraLax and I've asked her friend to try to help understand what her bowel patterns are and will see her back in January but is going to be important for her to have neurology followup regarding the memory loss.

## 2013-11-12 NOTE — Assessment & Plan Note (Signed)
I think this is her overriding problem now but she is demented. I discussed this a little bit with her friend today. Neurology follow up with her son present, he lives in [place], will be very important. He may be aware of notify Dr. Yetta Barre that she has neurology appointments with [facility] in February and [facility] neurology in January, the latter for paresthesia which she says is gone, she reports her arm feels fine today.

## 2013-11-12 NOTE — Patient Instructions (Signed)
Stop your Linzess.  Take Miralax over the counter one dose daily.  We want to see you back in 6 weeks.  I appreciate the opportunity to care for you.

## 2013-11-13 NOTE — Telephone Encounter (Signed)
No medications prescribed at office visit today, no orders to report.

## 2013-11-14 NOTE — Telephone Encounter (Signed)
Called son to advise. No answer. Will fwd to MRI coordinator to research.

## 2013-11-14 NOTE — Telephone Encounter (Signed)
Spoke to son Jessel Gettinger. He is requesting to go forward with MRI brain without contrast. He states the family feels the patient's symptoms are increasing.  He would like to be primary contact for patient due to condition also. Advised would forward information to Dr. Frances Furbish.

## 2013-11-14 NOTE — Telephone Encounter (Signed)
I ordered an MRI when I saw her in October. Please advise son.

## 2013-12-04 ENCOUNTER — Ambulatory Visit: Payer: Medicare Other | Admitting: Internal Medicine

## 2013-12-09 ENCOUNTER — Ambulatory Visit
Admission: RE | Admit: 2013-12-09 | Discharge: 2013-12-09 | Disposition: A | Discharge status: Home / Self Care | Payer: Medicare Other | Source: Ambulatory Visit | Attending: Internal Medicine | Admitting: Internal Medicine

## 2013-12-09 DIAGNOSIS — Z1231 Encounter for screening mammogram for malignant neoplasm of breast: Secondary | ICD-10-CM | POA: Diagnosis not present

## 2013-12-09 LAB — HM MAMMOGRAPHY: HM Mammogram: NORMAL

## 2013-12-10 ENCOUNTER — Ambulatory Visit: Payer: Medicare Other

## 2013-12-17 ENCOUNTER — Encounter: Payer: Self-pay | Admitting: Neurology

## 2013-12-17 ENCOUNTER — Ambulatory Visit (INDEPENDENT_AMBULATORY_CARE_PROVIDER_SITE_OTHER): Payer: Medicare Other | Admitting: Neurology

## 2013-12-17 VITALS — BP 116/70 | HR 68 | Temp 97.7°F | Ht 66.0 in | Wt 130.5 lb

## 2013-12-17 DIAGNOSIS — R209 Unspecified disturbances of skin sensation: Secondary | ICD-10-CM | POA: Diagnosis not present

## 2013-12-17 DIAGNOSIS — R202 Paresthesia of skin: Secondary | ICD-10-CM

## 2013-12-17 NOTE — Progress Notes (Signed)
[facility] Clinic Note - Initial Visit   Date: 12/17/2013    Katelyn Sparks MRN: [id number] DOB: 08/23/1935   Dear Dr Yetta Barre:  Thank you for your kind referral of Katelyn Sparks for consultation of right arm paresthesias. Although her history is well known to you, please allow [place] to reiterate it for the purpose of our medical record. The patient was accompanied to the clinic by self.    History of Present Illness: Katelyn Sparks is a 79 y.o. right-handed African American female with history of GERD, hyperlipidemia, OA, and dementia presenting for evaluation of right arm paresthesias.  Patient is a fair historian, so history is obtained from Epic records and confirmed with patient.  She reports having crawling sensation over the right shoulder for the past several months. The discomfort is described as a "crawling sensation and aggravating".  Symptoms are intermittent, occuring about 1-2 times per day, lasting only few minutes.  Discomfort is mostly localized to the shoulder and upper arm. She became alarmed by it because "it doesn't feel normal".  She denies any numbness, tingling, weakness, or neck pain.  No alleviating or exacerbating factors.    Per chart review, she saw Dr. Antoine Primas in September for left shoulder pain at which time she was diagnosed with [facility] joint arthritis and left shoulder rotator cuff syndrome for which she had ultrasound-guided injection and did very well.  She had significant relief of pain and has been doing her exercises.  Her current symptoms involving the right upper extremity is different from what she had experienced in her left shoulder.  She was seen by Dr. Clyda Hurdle at [facility] for early dementia in October 2014.  MRI brain and formal neuropsychological testing is pending. She reports being able to perform all the ADLs and IADLs. She is driving and has not been involved in any motor vehicle  accidents.    Past Medical History  Diagnosis Date  . Osteoarthrosis, unspecified whether generalized or localized, unspecified site   . Unspecified hemorrhoids without mention of complication   . Flatulence, eructation, and gas pain   . Esophageal reflux   . Blood in stool   . Dementia     Past Surgical History  Procedure Laterality Date  . Abdominal hysterectomy    . Colonoscopy  11/2010  . Upper gi endoscopy  11/2010     Medications:  Current Outpatient Prescriptions on File Prior to Visit  Medication Sig Dispense Refill  . atorvastatin (LIPITOR) 80 MG tablet Take 1 tablet (80 mg total) by mouth daily.  90 tablet  3  . cholecalciferol (VITAMIN D) 1000 UNITS tablet Take 1 tablet (1,000 Units total) by mouth daily.  100 tablet  3  . omeprazole (PRILOSEC) 40 MG capsule Take 1 capsule (40 mg total) by mouth daily.  90 capsule  3  . ondansetron (ZOFRAN ODT) 4 MG disintegrating tablet Take 1 tablet (4 mg total) by mouth every 8 (eight) hours as needed for nausea.  50 tablet  1  . polyethylene glycol (MIRALAX / GLYCOLAX) packet Take 17 g by mouth daily as needed.       No current facility-administered medications on file prior to visit.    Allergies: No Known Allergies  Family History: Family History  Problem Relation Age of Onset  . Diabetes    . Hypertension Mother   . Colon cancer Neg Hx   . Cancer Neg Hx   . Early death Neg Hx   .  Heart disease Neg Hx   . Hyperlipidemia Neg Hx   . Kidney disease Neg Hx   . Learning disabilities Neg Hx   . Stroke Neg Hx   . Diabetes Mother   . Diabetes Sister   . Diabetes Brother     Social History: History   Social History  . Marital Status: Widowed    Spouse Name: N/A    Number of Children: 2  . Years of Education: 12   Occupational History  . Retired    Social History Main Topics  . Smoking status: Never Smoker   . Smokeless tobacco: Never Used  . Alcohol Use: No  . Drug Use: No  . Sexual Activity: Not  Currently   Other Topics Concern  . Not on file   Social History Narrative   She is a retired from a tobacco company.   Her husband passed away in 2001.  She has two son.   Her grandson lives with her.    Review of Systems:  CONSTITUTIONAL: No fevers, chills, night sweats, or weight loss.  + Memory changes EYES: No visual changes or eye pain ENT: No hearing changes.  No history of nose bleeds.   RESPIRATORY: No cough, wheezing and shortness of breath.   CARDIOVASCULAR: Negative for chest pain, and palpitations.   GI: Negative for abdominal discomfort, blood in stools or black stools.  No recent change in bowel habits.   GU:  No history of incontinence.   MUSCLOSKELETAL: No history of joint pain or swelling.  No myalgias.   SKIN: Negative for lesions, rash, and itching.   HEMATOLOGY/ONCOLOGY: Negative for prolonged bleeding, bruising easily, and swollen nodes.   ENDOCRINE: Negative for cold or heat intolerance, polydipsia or goiter.   PSYCH:  No depression or anxiety symptoms.   NEURO: As Above.   Vital Signs:  BP 116/70  Pulse 68  Temp(Src) 97.7 F (36.5 C) (Oral)  Ht 5\' 6"  (1.676 m)  Wt 130 lb 8 oz (59.194 kg)  BMI 21.07 kg/m2   General Medical Exam:   General:  Thin-appearing, comfortable.   Eyes/ENT: see cranial nerve examination.   Neck: No masses appreciated.  Reduced neck range of motion especially with rotation and neck extension. No tenderness over the cervical paraspinal muscles. Respiratory:  Clear to auscultation, good air entry bilaterally.   Cardiac:  Regular rate and rhythm, no murmur.   Extremities:  No deformities, edema, or skin discoloration. Good capillary refill.   Skin:  Skin color, texture, turgor normal. No rashes or lesions.  Neurological Exam: MENTAL STATUS including orientation to time, place, person, recent and remote memory, attention span and concentration, language, and fund of knowledge is fair.  Speech is not dysarthric.  Serial  presidents:  Obama, Bush, Clinton   CRANIAL NERVES: II:  No visual field defects.  Unremarkable fundi.   III-IV-VI: Pupils equal round and reactive to light.  Normal conjugate, extra-ocular eye movements in all directions of gaze.  No nystagmus.  No ptosis. V:  Normal facial sensation.  Jaw jerk is absent.   VII:  Normal facial symmetry and movements.  No pathologic facial reflexes.  VIII:  Normal hearing and vestibular function.   IX-X:  Normal palatal movement.   XI:  Normal shoulder shrug and head rotation.   XII:  Normal tongue strength and range of motion, no deviation or fasciculation.  MOTOR:  No atrophy, fasciculations or abnormal movements.  No pronator drift.  Tone is normal.    Right  Upper Extremity:    Left Upper Extremity:    Deltoid  5/5   Deltoid  5/5   Biceps  5/5   Biceps  5/5   Triceps  5/5   Triceps  5/5   Wrist extensors  5/5   Wrist extensors  5/5   Wrist flexors  5/5   Wrist flexors  5/5   Finger extensors  5/5   Finger extensors  5/5   Finger flexors  5/5   Finger flexors  5/5   Dorsal interossei  5/5   Dorsal interossei  5/5   Abductor pollicis  5/5   Abductor pollicis  5/5   Tone (Ashworth scale)  0  Tone (Ashworth scale)  0   Right Lower Extremity:    Left Lower Extremity:    Hip flexors  5/5   Hip flexors  5/5   Hip extensors  5/5   Hip extensors  5/5   Knee flexors  5/5   Knee flexors  5/5   Knee extensors  5/5   Knee extensors  5/5   Dorsiflexors  5/5   Dorsiflexors  5/5   Plantarflexors  5/5   Plantarflexors  5/5   Toe extensors  5/5   Toe extensors  5/5   Toe flexors  5/5   Toe flexors  5/5   Tone (Ashworth scale)  0  Tone (Ashworth scale)  0   MSRs:   Right                                                                 Left brachioradialis 1+  brachioradialis 1+  biceps 1+  biceps 1+  triceps 1+  triceps 1+  patellar 1+  patellar 1+  ankle jerk 1+  ankle jerk 1+  Hoffman no  Hoffman no  plantar response down  plantar response down    SENSORY:  Normal and symmetric perception of light touch, pinprick, vibration, and proprioception.  Pin prick intact throughout, including over the neck and back.  Romberg's sign absent.   COORDINATION/GAIT: Normal finger-to- nose-finger and heel-to-shin. Reduced amplitude of finger tapping bilaterally.  Able to rise from a chair without using arms.  Gait narrow based and stable. Slightly unsteady with tandem and stressed gait intact.    IMPRESSION: Ms. Hascall is a 79 year-old female presenting for evaluation of right shoulder and upper arm paresthesias.  Neurological examination is non-focal, making it difficult to localize her symptoms.  Her sensory change do not clearly fit to a cutaneous nerve distribution and the absence of weakness makes a peripheral neuropathy unlikely.  A cervical radiculopathy (C5) could potentially be a possibility, especially as symptoms are starting to occur over the left arm, too.  I will obtain an EMG to help characterize the nature of her symptoms.  Since she denies any specific pain and has not noticed any associated joint symptoms, referred pain from a structural process would be less likely.    PLAN/RECOMMENDATIONS:  1.  EMG of the right upper extremity 2.  Return to clinic in 18-month   The duration of this appointment visit was 45 minutes of face-to-face time with the patient.  Greater than 50% of this time was spent in counseling, explanation of diagnosis, planning of further management, and coordination  of care.   Thank you for allowing me to participate in patient's care.  If I can answer any additional questions, I would be pleased to do so.    Sincerely,    Aneliz Carbary K. Posey Pronto, DO

## 2013-12-17 NOTE — Patient Instructions (Signed)
1.  EMG of the right upper extremity 2.  Return to clinic in 6859-month

## 2013-12-25 ENCOUNTER — Ambulatory Visit (INDEPENDENT_AMBULATORY_CARE_PROVIDER_SITE_OTHER): Payer: Medicare Other | Admitting: Internal Medicine

## 2013-12-25 ENCOUNTER — Encounter: Payer: Self-pay | Admitting: Internal Medicine

## 2013-12-25 VITALS — BP 132/68 | HR 84 | Ht 64.75 in | Wt 133.1 lb

## 2013-12-25 DIAGNOSIS — K5900 Constipation, unspecified: Secondary | ICD-10-CM

## 2013-12-25 DIAGNOSIS — R198 Other specified symptoms and signs involving the digestive system and abdomen: Secondary | ICD-10-CM | POA: Diagnosis not present

## 2013-12-25 DIAGNOSIS — R413 Other amnesia: Secondary | ICD-10-CM | POA: Diagnosis not present

## 2013-12-25 DIAGNOSIS — K5909 Other constipation: Secondary | ICD-10-CM

## 2013-12-25 NOTE — Progress Notes (Signed)
         Subjective:    Patient ID: Katelyn Sparks, female    DOB: 10/23/1935, 79 y.o.   MRN: [id number]  HPI The patient is a very nice elderly woman with a history of chronic constipation. On last exam I thought she had anal stenosis, she had a colonoscopy in 2011 she does have large anal tags as well. I recommended she use MiraLax which she says is helping and she moves her bowels most days. Her stools are still very thin. There is no bleeding.  Wt Readings from Last 3 Encounters:  12/25/13 133 lb 2 oz (60.385 kg)  12/17/13 130 lb 8 oz (59.194 kg)  11/12/13 130 lb (58.968 kg)      Review of Systems I have thought she might have some memory loss. She did see neurology about paresthesias in the beginning of this month and they found her to have an intact mental status and memory exam.    Objective:   Physical Exam She is a thin elderly black woman in no acute distress       Assessment & Plan:   1. Change in bowel movement   2. Chronic constipation

## 2013-12-25 NOTE — Assessment & Plan Note (Signed)
Maybe anxiety issues she appeared anxious and could not make a decision easily about the sigmoidoscopy. She was also asking me about the paresthesias and I explained that was a neurology issue. She did seem to have recollection of the visit there. However did not remember details.

## 2013-12-25 NOTE — Patient Instructions (Addendum)
Today you have been given a handout to read and follow on [employer].  Use 2 Tablespoons daily.  Follow up with us in 2 months.   I appreciate the opportunity to care for you.

## 2013-12-25 NOTE — Assessment & Plan Note (Addendum)
Appears to be improved overall. She does have thin caliber stools which she seems worried about. I recommended a sigmoidoscopy but she really did not want to do that. With a negative colonoscopy 2011 and anal stenosis on rectal exam I think it's unlikely that anything like a colorectal malignancy has developed. She wants to think about a sigmoidoscopy so I did not pressor on this given the overall situation. She will try adding a fiber supplement in addition to the intermittent MiraLax. I will see her back in 2 months.

## 2013-12-25 NOTE — Assessment & Plan Note (Signed)
I believe this is from her anal stenosis but thought sigmoidoscopy makes sense to clear the air make sure wasn't something more serious. She initially agreed and a decided to hold off on this. We'll at fiber and revisit in 2 months.

## 2013-12-31 ENCOUNTER — Telehealth: Payer: Self-pay | Admitting: Neurology

## 2013-12-31 NOTE — Telephone Encounter (Signed)
MRI was ordered by Dr. Frances FurbishAthar at the time of office visit , 09/13/13. GI scheduled MRI on 10/08/13. Patient no showed per GI. Provided son with GI phone number to reschedule MRI on vmail after speaking with him on 11/14/13. Spoke to son Caryn BeeKevin. He agreed. He said he does have GI contact info and he will call today to reschedule MRI appt. Son is requesting to move patient's upcoming appt w/ Dr. Frances FurbishAthar to a later date because he will not be able to come to the visit. Advised son the next available appt is 2-3 months after 01/13/14. Son agreed to keep the 01/13/14 appt, stating he will have his mother-in-law and brother to come to visit with patient.

## 2013-12-31 NOTE — Telephone Encounter (Signed)
Patient's son calling and is very upset and frustrated that he has not heard back yet about scheduling an MRI for patient. Patient was seen in October and has an appointment coming up in February and son is frustrated that this visit is coming up and he hasn't heard anything back yet. Patient's son also mentioned switching doctors as he is not happy with the care, I advised him that our protocol is to mail a letter to office administrator regarding change of doctors. Please call the patient to advise, he also has other concerns about his mother's care.

## 2014-01-02 ENCOUNTER — Other Ambulatory Visit: Payer: Medicare Other | Admitting: Internal Medicine

## 2014-01-08 ENCOUNTER — Ambulatory Visit
Admission: RE | Admit: 2014-01-08 | Discharge: 2014-01-08 | Disposition: A | Discharge status: Home / Self Care | Payer: Medicare Other | Source: Ambulatory Visit | Attending: Neurology | Admitting: Neurology

## 2014-01-08 DIAGNOSIS — E785 Hyperlipidemia, unspecified: Secondary | ICD-10-CM

## 2014-01-08 DIAGNOSIS — F0280 Dementia in other diseases classified elsewhere without behavioral disturbance: Secondary | ICD-10-CM

## 2014-01-08 DIAGNOSIS — G309 Alzheimer's disease, unspecified: Secondary | ICD-10-CM

## 2014-01-08 DIAGNOSIS — R51 Headache: Secondary | ICD-10-CM | POA: Diagnosis not present

## 2014-01-08 DIAGNOSIS — E119 Type 2 diabetes mellitus without complications: Secondary | ICD-10-CM

## 2014-01-13 ENCOUNTER — Encounter: Payer: Self-pay | Admitting: Neurology

## 2014-01-13 ENCOUNTER — Ambulatory Visit (INDEPENDENT_AMBULATORY_CARE_PROVIDER_SITE_OTHER): Payer: Medicare Other | Admitting: Neurology

## 2014-01-13 ENCOUNTER — Encounter (INDEPENDENT_AMBULATORY_CARE_PROVIDER_SITE_OTHER): Payer: Self-pay

## 2014-01-13 VITALS — BP 126/73 | HR 63 | Temp 98.3°F | Ht 66.0 in | Wt 131.0 lb

## 2014-01-13 DIAGNOSIS — F0280 Dementia in other diseases classified elsewhere without behavioral disturbance: Secondary | ICD-10-CM | POA: Diagnosis not present

## 2014-01-13 DIAGNOSIS — G309 Alzheimer's disease, unspecified: Secondary | ICD-10-CM | POA: Diagnosis not present

## 2014-01-13 DIAGNOSIS — E785 Hyperlipidemia, unspecified: Secondary | ICD-10-CM

## 2014-01-13 DIAGNOSIS — E7220 Disorder of urea cycle metabolism, unspecified: Secondary | ICD-10-CM

## 2014-01-13 MED ORDER — DONEPEZIL HCL 5 MG PO TABS: 5.0000 mg | ORAL_TABLET | Freq: Every day | ORAL | Status: DC

## 2014-01-13 NOTE — Progress Notes (Signed)
Subjective:    Patient ID: Katelyn Sparks is a 79 y.o. female.  HPI    Interim history:  Katelyn Sparks is a very pleasant 79 year old right-handed woman with an underlying medical history of diabetes, reflux disease, and left shoulder pain, who presents for followup consultation of her memory loss, probable Alzheimer's disease. The patient is accompanied by her son, Katelyn Sparks, today. I first met her on 09/13/2013 at which time she was accompanied by her friend Katelyn Sparks. The patient reported a history of memory problems for about one year including forgetfulness, misplacing things, easy frustration. I suggested further workup in the form of neuropsychological testing as well as MRI brain. She had a brain MRI without contrast on 01/09/2014: Abnormal MRI brain (without) demonstrating: 1. Moderate periventricular and subcortical chronic small vessel ischemic disease. 2. Punctate left parietal convexity chronic cerebral microhemorrhage. In addition, personally reviewed the images through the PACS system and explained the findings to the patient and her son.  Regarding her cognitive testing, received a message from Katelyn Sparks from Katelyn Sparks office on 12/10/13: "Katelyn Sparks dis not show for her appointment on 12/09/13 for neuropsychological evaluation. When contacted, she seemed confused and claimed no recollection of having made the appointment. She agreed to come in later the same morning but again did not show. Called her. She stated she went to the wrong place. She re-scheduled for 12/16/13. She called on 12/10/13 to cancel that appointment and did not wish to re-schedule."  She lives in her own home, is divorced and her 77 yo GS lives with her. This causes her stress at times. She drives, and has not gotten lost driving. She has no VH or AH, no SI/HI, no paranoid delusions, no Hx of inappropriate behavior. Her father had dementia and died at the age of 66. He mother lived to be 42 and had no dementia. She  had a total of 13 siblings, 3 died in childhood. No siblings with memory loss.  She has not been on any dementia medications. She denies depression or anxiety. She has not been on an antidepressant.  The patient denies prior TIA or stroke symptoms, such as sudden onset of one sided weakness, numbness, tingling, slurring of speech or droopy face, hearing loss, tinnitus, diplopia or visual field cut or monocular loss of vision, and denies recurrent headaches. No falls, no head injury, no snoring was reported. 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  I reviewed blood work from 08/28/13 from her PCP's office: N CBC, N CMP and HbA1c of 6.6 and elevated cholesterol at 262 and LDL of 172.   Her Past Medical History Is Significant For: Past Medical History  Diagnosis Date  . Osteoarthrosis, unspecified whether generalized or localized, unspecified site   . Unspecified hemorrhoids without mention of complication   . Flatulence, eructation, and gas pain   . Esophageal reflux   . Blood in stool   . Dementia     Her Past Surgical History Is Significant For: Past Surgical History  Procedure Laterality Date  . Abdominal hysterectomy    . Colonoscopy  11/2010  . Upper gi endoscopy  11/2010    Her Family History Is Significant For: Family History  Problem Relation Age of Onset  . Diabetes    . Hypertension Mother   . Colon cancer Neg Hx   . Cancer Neg Hx   . Early death Neg Hx   . Heart disease Neg Hx   . Hyperlipidemia  Neg Hx   . Kidney disease Neg Hx   . Learning disabilities Neg Hx   . Stroke Neg Hx   . Diabetes Mother   . Diabetes Sister   . Diabetes Brother     Her Social History Is Significant For: History   Social History  . Marital Status: Widowed    Spouse Name: N/A    Number of Children: 2  . Years of Education: 12   Occupational History  . Retired    Social History Main Topics  . Smoking status: Never Smoker   . Smokeless tobacco: Never Used  .  Alcohol Use: No  . Drug Use: No  . Sexual Activity: Not Currently   Other Topics Concern  . None   Social History Narrative   She is a retired from a [employer].   Her husband passed away in 03-02-2000.  She has two son.   Her grandson lives with her.    Her Allergies Are:  No Known Allergies:   Her Current Medications Are:  Outpatient Encounter Prescriptions as of 01/13/2014  Medication Sig  . atorvastatin (LIPITOR) 80 MG tablet Take 1 tablet (80 mg total) by mouth daily.  . cholecalciferol (VITAMIN D) 1000 UNITS tablet Take 1 tablet (1,000 Units total) by mouth daily.  Marland Kitchen omeprazole (PRILOSEC) 40 MG capsule Take 1 capsule (40 mg total) by mouth daily.  . ondansetron (ZOFRAN ODT) 4 MG disintegrating tablet Take 1 tablet (4 mg total) by mouth every 8 (eight) hours as needed for nausea.  . polyethylene glycol (MIRALAX / GLYCOLAX) packet Take 17 g by mouth daily as needed.  :  Review of Systems:  Out of a complete 14 point review of systems, all are reviewed and negative with the exception of these symptoms as listed below:   Review of Systems  Constitutional: Negative.   HENT: Positive for hearing loss.   Eyes: Negative.   Respiratory: Negative.   Cardiovascular: Negative.   Gastrointestinal: Positive for nausea, constipation and rectal pain.  Endocrine: Negative.   Genitourinary: Negative.   Musculoskeletal: Positive for back pain and gait problem.  Skin: Negative.   Allergic/Immunologic: Negative.   Neurological:       Memory loss  Hematological: Negative.   Psychiatric/Behavioral: Positive for confusion, dysphoric mood and agitation.    Objective:  Neurologic Exam  Physical Exam Physical Examination:   Filed Vitals:   01/13/14 1428  BP: 126/73  Pulse: 63  Temp: 98.3 F (36.8 C)   General Examination: The patient is a very pleasant 79 y.o. female in no acute distress. She is calm and cooperative with the exam. She denies Auditory Hallucinations and Visual  Hallucinations. She is well groomed and situated in a chair.   HEENT: Normocephalic, atraumatic, pupils are equal, round and reactive to light and accommodation. Funduscopic exam is normal with sharp disc margins noted. Extraocular tracking shows mild saccadic breakdown without nystagmus noted. Hearing is impaired mildly. Face is symmetric with no facial masking and normal facial sensation. There is no lip, neck or jaw tremor. Neck is mildly rigid with intact passive ROM. There are no carotid bruits on auscultation. Oropharynx exam reveals mild mouth dryness. Moderate airway crowding is noted. Mallampati is class III. Tongue protrudes centrally and palate elevates symmetrically.    Chest: is clear to auscultation without wheezing, rhonchi or crackles noted.  Heart: sounds are regular and normal without murmurs, rubs or gallops noted.   Abdomen: is soft, non-tender and non-distended with normal bowel sounds  appreciated on auscultation.  Extremities: There is no pitting edema in the distal lower extremities bilaterally. Pedal pulses are intact.   Skin: is warm and dry with no trophic changes noted. Age-related changes are noted on the skin.   Musculoskeletal: exam reveals no obvious joint deformities, tenderness or joint swelling or erythema.   Neurologically:  Mental status: The patient is awake and alert, paying good  attention. She is able to partially provide the history. Her friend provides some details. She is oriented to: person, place, situation, day of week and year. Her memory, attention, language and knowledge are impaired. There is no aphasia, agnosia, apraxia or anomia. There is a mild degree of bradyphrenia. Speech is mildly hypophonic with no dysarthria noted. Mood is congruent and affect is normal.  On 09/13/13: Her MMSE (Mini-Mental state exam) score was 21/30, CDT (Clock Drawing Test) score was 4/4 and AFT (Animal Fluency Test) score was 10.   Cranial nerves are as described above  under HEENT exam. In addition, shoulder shrug is normal with equal shoulder height noted.  Motor exam: Normal bulk, and strength for age is noted. Tone is not rigid with absence of cogwheeling in the extremities. There is overall no significant bradykinesia. There is no drift or rebound. There is no tremor.   Romberg is negative. Reflexes are 1+ in the upper extremities and 1+ in the lower extremities. Toes are downgoing bilaterally. Fine motor skills: Finger taps, hand movements, and rapid alternating patting are not impaired bilaterally. Foot taps and foot agility are not impaired bilaterally.   Cerebellar testing shows no dysmetria or intention tremor on finger to nose testing. Heel to shin is unremarkable. There is no truncal or gait ataxia.   Sensory exam is intact to light touch, pinprick, vibration, temperature sense in the upper and lower extremities.   Gait, station and balance: She stands up from the seated position with no difficulty and needs no assistance. No veering to one side is noted. No leaning to one side. Posture is mildly stooped, age-appropriate. Stance is narrow-based. She turns en bloc. Tandem walk is not easily possible. Balance is mildly impaired.   Assessment and Plan:   In summary, Katelyn Sparks is a very pleasant 79 year-old female with an underlying medical history of HLP, reflux disease, and left shoulder pain, who has an over 1 year history of memory loss. Her history and physical exam are keeping with memory loss likely beyond MCI, possibly early dementia of the Alzheimer's type. She has moderate WM disease on her brain MRI as well as chronic microhemorrhage on the L. We will do a CTH down the road to monitor. She had a mild increase in ammonia, and we will recheck today. She has stopped the lipitor d/t chronic constipation. She has seen Dr. Posey Pronto in neurology for RUE paresthesias and is scheduled for an EMG on 01/16/14.  I had a long chat with the patient and her son  about my findings and the diagnosis of memory loss and dementia, its prognosis and treatment options. Her son lives locally, the other one in [place], [place]. We talked about medical treatments and non-pharmacological approaches. We talked about maintaining a healthy lifestyle in general and staying active mentally and physically. I encouraged the patient to eat healthy, exercise daily and keep well hydrated, to keep a scheduled bedtime and wake time routine, to not skip any meals and eat healthy snacks in between meals and to have protein with every meal. I stressed the importance  of regular exercise, within of course the patient's own mobility limitations. I encouraged the patient to keep up with current events by reading the news paper or watching the news and to do word puzzles, or if feasible, to go on [email or web address]. She did not show and cancel her memory test and is willing to re-schedule this.   As far as further diagnostic testing is concerned, I suggested the following: formal memory testing in the form of neuropsychological evaluation and rechecking ammonia today.  As far as medications are concerned, I recommended the following at this time: I suggested starting low dose aricept at 5 mg strength.   I answered all their questions today and the patient and her son were in agreement with the above outlined plan. I would like to see the patient back in 3 months, sooner if the need arises and encouraged her to call with any interim questions, concerns, problems, updates and test results.

## 2014-01-13 NOTE — Patient Instructions (Addendum)
We will start you on a medication for your memory called Aricept (generic name: donepezil) 5 mg: take one pill each evening. Common side effects include dry eyes, dry mouth, confusion, low pulse, low blood pressure and rare side effects include hallucinations.  I want you to re-schedule your memory testing appointment with Dr. Leonides CaveZelson.

## 2014-01-14 LAB — AMMONIA: Ammonia: 52 ug/dL (ref 19–87)

## 2014-01-14 NOTE — Progress Notes (Signed)
Quick Note:  Please advise patient's son, Sharyl NimrodCedric, that her ammonia level is normal. Huston FoleySaima Mohamedamin Nifong, MD, PhD [facility]  ______

## 2014-01-16 ENCOUNTER — Encounter: Payer: Self-pay | Admitting: Neurology

## 2014-01-16 ENCOUNTER — Ambulatory Visit (INDEPENDENT_AMBULATORY_CARE_PROVIDER_SITE_OTHER): Payer: Medicare Other | Admitting: Neurology

## 2014-01-16 DIAGNOSIS — M5412 Radiculopathy, cervical region: Secondary | ICD-10-CM

## 2014-01-16 DIAGNOSIS — G5621 Lesion of ulnar nerve, right upper limb: Secondary | ICD-10-CM

## 2014-01-16 DIAGNOSIS — G5600 Carpal tunnel syndrome, unspecified upper limb: Secondary | ICD-10-CM

## 2014-01-16 DIAGNOSIS — G5620 Lesion of ulnar nerve, unspecified upper limb: Secondary | ICD-10-CM

## 2014-01-16 NOTE — Progress Notes (Signed)
See procedure note under "Notes" tab for EMG results.  Emira Eubanks K. Hadleigh Felber, DO  

## 2014-01-16 NOTE — Procedures (Signed)
[facility]  [address], [address]  [place], [place] [address] Tel: [phone] Fax:  [phone] Test Date:  01/16/2014  Patient: Katelyn Sparks DOB: 04/11/1935 Physician: Nita Sickleonika Patel, DO  Sex: Female Height: 5\' 6"  Ref Phys: Nita Sickleatel, Donika  ID#: [id number] Temp: 35.2C Technician:    Patient Complaints: This is a 79 year-old female presenting for evaluation of right shoulder paresthesias.  NCV & EMG Findings: Extensive evaluation of her right upper extremity and additional studies of the left reveals:  1. Bilateral median sensory responses are prolonged with preserved amplitude. Bilateral ulnar responses are normal. The right radial sensory response is normal.  2. The left proximal median motor response was increased (9.5 mV) while the motor amplitude at the wrist was reduced (7.2 mV). 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 The right median motor response is normal. The right ulnar motor nerve showed prolonged distal onset latency (3.2 ms) and decreased conduction velocity (A Elbow-B Elbow, 36 m/s).  The left ulnar motor nerve shows mildly reduced conduction velocity across the elbow. 3. Needle electrode examination shows chronic motor axonal loss changes affecting C6-C7 myotomes bilaterally in addition the flexor digitorum profundus muscle on the right.  There is no evidence of active denervation.  Impression: 1. Chronic C6-7 motor radiculopathy affecting bilateral upper extremities; mild in degree electrically 2. Moderate bilateral median neuropathy at or distal to the wrist consistent with the clinical diagnosis of carpal tunnel syndrome and worse on the left side. 3. Mild chronic right ulnar neuropathy across the elbow, demyelinating and axonal loss in type. 4. Incidentally, there is a left Martin-Gruber anastomosis which is a normal variant.       ___________________________ Nita Sickleonika Patel, DO    Nerve Conduction Studies Anti Sensory Summary Table   Site NR Peak (ms) Norm Peak (ms) P-T Amp (V) Norm P-T Amp  Left Median Anti Sensory (2nd Digit)  Wrist    4.8 <3.8 25.5 >10  Right Median Anti Sensory (2nd Digit)  Wrist    4.0 <3.8 36.8 >10  Right Radial Anti Sensory (Base 1st Digit)  Wrist    2.5 <2.8 41.7 >10  Left Ulnar Anti Sensory (5th Digit)  Wrist    3.2 <3.2 50.5 >5  Right Ulnar Anti Sensory (5th Digit)  Wrist    3.1 <3.2 46.5 >5   Motor Summary Table   Site NR Onset (ms) Norm Onset (ms) O-P Amp (mV) Norm O-P Amp Site1 Site2 Delta-0 (ms) Dist (cm) Vel (m/s) Norm Vel (m/s)  Left Median Motor (Abd Poll Brev)  Wrist    5.1 <4.0 7.2 >5 Elbow Wrist 5.4 29.0 54 >50  Elbow    10.5  9.5  Ulnar wrist - cross over Elbow 5.7 0.0    Ulnar wrist - cross over    4.8  5.3         Right Median Motor (Abd Poll Brev)  Wrist    3.8 <4.0 13.8 >5 Elbow Wrist 4.5 29.0 64 >50  Elbow    8.3  11.8         Left Ulnar Motor (Abd Dig Minimi)  Wrist    2.7 <3.1 9.8 >7 B Elbow Wrist 4.2 22.0 52 >50  B Elbow    6.9  7.2  A Elbow B Elbow 2.2 10.0 45 >50  A Elbow    9.1  7.2  Right Ulnar Motor (Abd Dig Minimi)  Wrist    3.2 <3.1 11.6 >7 B Elbow Wrist 3.8 22.0 58 >50  B Elbow    7.0  8.4  A Elbow B Elbow 2.8 10.0 36 >50  A Elbow    9.8  7.4          EMG   Side Muscle Ins Act Fibs Psw Fasc Number Recrt Dur Dur. Amp Amp. Poly Poly. Comment  Right 1stDorInt Nml Nml Nml Nml Nml Nml Nml Nml Nml Nml Nml Nml N/A  Right Abd Poll Brev Nml Nml Nml Nml Nml Nml Nml Nml Nml Nml Nml Nml N/A  Right FlexPolLong Nml Nml Nml Nml Nml Nml Nml Nml Nml Nml Nml Nml N/A  Right PronatorTeres Nml Nml Nml Nml 1- Mod-R Few 1+ Nml Nml Few 1+ N/A  Right Biceps Nml Nml Nml Nml 1- Mod Some 1+ Nml Nml Nml Nml N/A  Right ABD Dig Min Nml Nml Nml Nml Nml Nml Nml Nml Nml Nml Nml Nml N/A  Right FlexDigProf 4,5 Nml Nml Nml Nml 1- Mod-R Some 1+ Some 1+ Some 1+ N/A   Right Triceps Nml Nml Nml Nml 1- Mod-R Some 1+ Some 1+ Some 1+ N/A  Left PronatorTeres Nml Nml Nml Nml 1- Mod-R Some 1+ Nml Nml Nml Nml N/A  Left Triceps (Medial) Nml Nml Nml Nml 1- Mod-R Some 1+ Nml Nml Some 1+ N/A  Left 1stDorInt Nml Nml Nml Nml Nml Nml Nml Nml Nml Nml Nml Nml N/A  Left Biceps Nml Nml Nml Nml 1- Mod-R Some 1+ Some 1+ Nml Nml N/A  Left Deltoid Nml Nml Nml Nml Nml Nml Nml Nml Nml Nml Nml Nml N/A      Waveforms:

## 2014-01-23 ENCOUNTER — Encounter: Payer: Self-pay | Admitting: Neurology

## 2014-01-23 ENCOUNTER — Ambulatory Visit (INDEPENDENT_AMBULATORY_CARE_PROVIDER_SITE_OTHER): Payer: Medicare Other | Admitting: Neurology

## 2014-01-23 VITALS — BP 158/76 | HR 60 | Resp 16 | Ht 66.0 in | Wt 128.4 lb

## 2014-01-23 DIAGNOSIS — M5412 Radiculopathy, cervical region: Secondary | ICD-10-CM | POA: Diagnosis not present

## 2014-01-23 NOTE — Progress Notes (Signed)
Follow-up Visit   Date: 01/23/2014    Katelyn Sparks MRN: [id number] DOB: 03/23/1935   Interim History: Katelyn Sparks is a 79 y.o. right-handed African American female with history of GERD, hyperlipidemia, OA, and dementia  returning to the clinic for follow-up of right arm paresthesias.  The patient was accompanied to the clinic by self.  History of present illness: During the fall of 2014, she developed crawling sensation over the right shoulder. Symptoms are intermittent, occurring about 1-2 times per day, lasting only few minutes. Discomfort is mostly localized to the shoulder and upper arm. She became alarmed by it because "it doesn't feel normal". She denies any numbness, tingling, weakness, or neck pain. Per chart review, she saw Dr. Antoine PrimasZachary Smith in September for left shoulder pain at which time [facility] and left shoulder rotator cuff syndrome for which she had ultrasound-guided injection and did very well. She had significant relief of pain and has been doing her exercises. Her current symptoms involving the right upper extremity is different from what she had experienced in her left shoulder.   She was [facility] by [facility] Cly[facility]ar [facility] for early dementia in October 2014. MRI brain and formal neuropsychological testing is pending. She reports being able to perform all the ADLs and IADLs. She is driving and has not been involved in any motor vehicle accidents.   - Follow-up 01/23/2014:  Right arm discomfort has improved, but she continues to get crawling sensation over the shoulder, arm, and forearm.  She also complains of change in caliber of her stool and weight loss.   Medications:  Current Outpatient Prescriptions on File Prior to Visit  Medication Sig Dispense Refill  . atorvastatin (LIPITOR) 80 MG tablet Take 1 tablet (80 mg total) by mouth daily.  90 tablet  3  . cholecalciferol (VITAMIN D) 1000 UNITS tablet Take 1  tablet (1,000 Units total) by mouth daily.  100 tablet  3  . donepezil (ARICEPT) 5 MG tablet Take 1 tablet (5 mg total) by mouth at bedtime.  30 tablet  5  . omeprazole (PRILOSEC) 40 MG capsule Take 1 capsule (40 mg total) by mouth daily.  90 capsule  3  . ondansetron (ZOFRAN ODT) 4 MG disintegrating tablet Take 1 tablet (4 mg total) by mouth every 8 (eight) hours as needed for nausea.  50 tablet  1  . polyethylene glycol (MIRALAX / GLYCOLAX) packet Take 17 g by mouth daily as needed.       No current facility-administered medications on file prior to visit.    Allergies: No Known Allergies   Review of Systems:  CONSTITUTIONAL: No fevers, chills, night sweats, +weight loss.   EYES: No visual changes or eye pain ENT: No hearing changes.  No history of nose bleeds.   RESPIRATORY: No cough, wheezing and shortness of breath.   CARDIOVASCULAR: Negative for chest pain, and palpitations.   GI: Negative for abdominal discomfort, blood in stools or black stools.  + change in bowel habits.   GU:  No history of incontinence.   MUSCLOSKELETAL: No history of joint pain or swelling.  No myalgias.   SKIN: Negative for lesions, rash, and itching.   ENDOCRINE: Negative for cold or heat intolerance, polydipsia or goiter.   PSYCH:  No depression or anxiety symptoms.   NEURO: As Above.   Vital Signs:  BP 158/76  Pulse 60  Resp 16  Ht 5\' 6"  (1.676 m)  Wt 128 lb 6 oz (58.231 kg)  BMI 20.73 kg/m2  Neurological Exam: MENTAL STATUS including orientation to time, place, person, recent and remote memory, attention span and concentration, language, and fund of knowledge is normal.  Speech is not dysarthric.  CRANIAL NERVES: Pupils equal round and reactive to light.  Normal conjugate, extra-ocular eye movements in all directions of gaze.  Normal facial sensation.  Face is symmetric. Palate elevates symmetrically.  Tongue is midline.  MOTOR:  Motor strength is 5/5 in all extremities.  No atrophy,  fasciculations or abnormal movements.  No pronator drift.  Tone is normal.    MSRs:  Reflexes are 1+/4 throughout, except 2+/4 at the patella.  SENSORY:  Intact to to light touch, pin prick, and vibration.  COORDINATION/GAIT:  Normal finger-to- nose-finger. Gait narrow based and stable.   Data: MRI brain 01/09/2014: Abnormal MRI brain (without) demonstrating:  1. Moderate periventricular and subcortical chronic small vessel ischemic disease.  2. Punctate left parietal convexity chronic cerebral microhemorrhage.   EMG 01/16/2013: Chronic C6-7 motor radiculopathy affecting bilateral upper extremities; mild in degree electrically. Moderate bilateral median neuropathy at or distal to the wrist consistent with the clinical diagnosis of carpal tunnel syndrome and worse on the left side. Mild chronic right ulnar neuropathy across the elbow, demyelinating and axonal loss in type. Incidentally, there is a left Martin-Gruber anastomosis which is a normal variant.   IMPRESSION/PLAN: Ms. Martinec is a 79 year-old female presenting for evaluation of right upper arm paresthesias. Neurological examination is non-focal.  Her EMG shows shows bilateral C6-7 radiculopathy which is most likely causing sensory changes. Fortunately, her symptoms have improved, but she continues to have intermittent discomfort of the arms which bothers her, so will proceed with neck PT.    She is asymptomatic from L >R CTS and left ulnar neuropathy. I discussed using a wrist splint and/or elbow bad and to prevent hyperflexion of the wrist and elbow, if she does develop numbness/tinging of the hands.  I have asked her to follow-up with Dr. Yetta Barre for her GI complaints of change in caliber of stool (pencil-like) and weight loss.  I will see her back in 27-months.  The duration of this appointment visit was 30 minutes of face-to-face time with the patient.  Greater than 50% of this time was spent in counseling, explanation of diagnosis,  planning of further management, and coordination of care.   Thank you for allowing me to participate in patient's care.  If I can answer any additional questions, I would be pleased to do so.    Sincerely,    Donika K. Allena Katz, DO

## 2014-01-23 NOTE — Patient Instructions (Addendum)
I want you to start physical therapy for your neck and arm discomfort. I will see you back in the office in 843-months  The Physical Therapy department will contact you to set up an appointment.

## 2014-02-10 ENCOUNTER — Ambulatory Visit (INDEPENDENT_AMBULATORY_CARE_PROVIDER_SITE_OTHER): Payer: Medicare Other | Admitting: Internal Medicine

## 2014-02-10 ENCOUNTER — Other Ambulatory Visit (INDEPENDENT_AMBULATORY_CARE_PROVIDER_SITE_OTHER): Payer: Medicare Other

## 2014-02-10 ENCOUNTER — Encounter: Payer: Self-pay | Admitting: Internal Medicine

## 2014-02-10 VITALS — BP 110/68 | HR 74 | Temp 97.1°F | Resp 16 | Ht 66.0 in | Wt 129.5 lb

## 2014-02-10 DIAGNOSIS — E1165 Type 2 diabetes mellitus with hyperglycemia: Principal | ICD-10-CM

## 2014-02-10 DIAGNOSIS — IMO0001 Reserved for inherently not codable concepts without codable children: Secondary | ICD-10-CM | POA: Diagnosis not present

## 2014-02-10 LAB — BASIC METABOLIC PANEL
BUN: 11 mg/dL (ref 6–23)
CHLORIDE: 103 meq/L (ref 96–112)
CO2: 30 mEq/L (ref 19–32)
Calcium: 9.5 mg/dL (ref 8.4–10.5)
Creatinine, Ser: 0.8 mg/dL (ref 0.4–1.2)
GFR: 84.59 mL/min (ref >60.00)
GLUCOSE: 100 mg/dL — AB (ref 70–99)
Potassium: 4.1 mEq/L (ref 3.5–5.1)
Sodium: 138 mEq/L (ref 135–145)

## 2014-02-10 LAB — HM DIABETES FOOT EXAM

## 2014-02-10 LAB — HEMOGLOBIN A1C: Hgb A1c MFr Bld: 6.3 % (ref 4.6–6.5)

## 2014-02-10 NOTE — Assessment & Plan Note (Signed)
I will recheck her A1C and will treat only if it is above 7.0 She was referred for an eye exam

## 2014-02-10 NOTE — Patient Instructions (Signed)
Type 2 Diabetes Mellitus, Adult Type 2 diabetes mellitus, often simply referred to as type 2 diabetes, is a long-lasting (chronic) disease. In type 2 diabetes, the pancreas does not make enough insulin (a hormone), the cells are less responsive to the insulin that is made (insulin resistance), or both. Normally, insulin moves sugars from food into the tissue cells. The tissue cells use the sugars for energy. The lack of insulin or the lack of normal response to insulin causes excess sugars to build up in the blood instead of going into the tissue cells. As a result, high blood sugar (hyperglycemia) develops. The effect of high sugar (glucose) levels can cause many complications. Type 2 diabetes was also previously called adult-onset diabetes but it can occur at any age.  RISK FACTORS  A person is predisposed to developing type 2 diabetes if someone in the family has the disease and also has one or more of the following primary risk factors:  Overweight.  An inactive lifestyle.  A history of consistently eating high-calorie foods. Maintaining a normal weight and regular physical activity can reduce the chance of developing type 2 diabetes. SYMPTOMS  A person with type 2 diabetes may not show symptoms initially. The symptoms of type 2 diabetes appear slowly. The symptoms include:  Increased thirst (polydipsia).  Increased urination (polyuria).  Increased urination during the night (nocturia).  Weight loss. This weight loss may be rapid.  Frequent, recurring infections.  Tiredness (fatigue).  Weakness.  Vision changes, such as blurred vision.  Fruity smell to your breath.  Abdominal pain.  Nausea or vomiting.  Cuts or bruises which are slow to heal.  Tingling or numbness in the hands or feet. DIAGNOSIS Type 2 diabetes is frequently not diagnosed until complications of diabetes are present. Type 2 diabetes is diagnosed when symptoms or complications are present and when blood  glucose levels are increased. Your blood glucose level may be checked by one or more of the following blood tests:  A fasting blood glucose test. You will not be allowed to eat for at least 8 hours before a blood sample is taken.  A random blood glucose test. Your blood glucose is checked at any time of the day regardless of when you ate.  A hemoglobin A1c blood glucose test. A hemoglobin A1c test provides information about blood glucose control over the previous 3 months.  An oral glucose tolerance test (OGTT). Your blood glucose is measured after you have not eaten (fasted) for 2 hours and then after you drink a glucose-containing beverage. TREATMENT   You may need to take insulin or diabetes medicine daily to keep blood glucose levels in the desired range.  You will need to match insulin dosing with exercise and healthy food choices. The treatment goal is to maintain the before meal blood sugar (preprandial glucose) level at 70 130 mg/dL. HOME CARE INSTRUCTIONS   Have your hemoglobin A1c level checked twice a year.  Perform daily blood glucose monitoring as directed by your caregiver.  Monitor urine ketones when you are ill and as directed by your caregiver.  Take your diabetes medicine or insulin as directed by your caregiver to maintain your blood glucose levels in the desired range.  Never run out of diabetes medicine or insulin. It is needed every day.  Adjust insulin based on your intake of carbohydrates. Carbohydrates can raise blood glucose levels but need to be included in your diet. Carbohydrates provide vitamins, minerals, and fiber which are an essential part of   a healthy diet. Carbohydrates are found in fruits, vegetables, whole grains, dairy products, legumes, and foods containing added sugars.    Eat healthy foods. Alternate 3 meals with 3 snacks.  Lose weight if overweight.  Carry a medical alert card or wear your medical alert jewelry.  Carry a 15 gram  carbohydrate snack with you at all times to treat low blood glucose (hypoglycemia). Some examples of 15 gram carbohydrate snacks include:  Glucose tablets, 3 or 4   Glucose gel, 15 gram tube  Raisins, 2 tablespoons (24 grams)  Jelly beans, 6  Animal crackers, 8  Regular pop, 4 ounces (120 mL)  Gummy treats, 9  Recognize hypoglycemia. Hypoglycemia occurs with blood glucose levels of 70 mg/dL and below. The risk for hypoglycemia increases when fasting or skipping meals, during or after intense exercise, and during sleep. Hypoglycemia symptoms can include:  Tremors or shakes.  Decreased ability to concentrate.  Sweating.  Increased heart rate.  Headache.  Dry mouth.  Hunger.  Irritability.  Anxiety.  Restless sleep.  Altered speech or coordination.  Confusion.  Treat hypoglycemia promptly. If you are alert and able to safely swallow, follow the 15:15 rule:  Take 15 20 grams of rapid-acting glucose or carbohydrate. Rapid-acting options include glucose gel, glucose tablets, or 4 ounces (120 mL) of fruit juice, regular soda, or low fat milk.  Check your blood glucose level 15 minutes after taking the glucose.  Take 15 20 grams more of glucose if the repeat blood glucose level is still 70 mg/dL or below.  Eat a meal or snack within 1 hour once blood glucose levels return to normal.    Be alert to polyuria and polydipsia which are early signs of hyperglycemia. An early awareness of hyperglycemia allows for prompt treatment. Treat hyperglycemia as directed by your caregiver.  Engage in at least 150 minutes of moderate-intensity physical activity a week, spread over at least 3 days of the week or as directed by your caregiver. In addition, you should engage in resistance exercise at least 2 times a week or as directed by your caregiver.  Adjust your medicine and food intake as needed if you start a new exercise or sport.  Follow your sick day plan at any time you  are unable to eat or drink as usual.  Avoid tobacco use.  Limit alcohol intake to no more than 1 drink per day for nonpregnant women and 2 drinks per day for men. You should drink alcohol only when you are also eating food. Talk with your caregiver whether alcohol is safe for you. Tell your caregiver if you drink alcohol several times a week.  Follow up with your caregiver regularly.  Schedule an eye exam soon after the diagnosis of type 2 diabetes and then annually.  Perform daily skin and foot care. Examine your skin and feet daily for cuts, bruises, redness, nail problems, bleeding, blisters, or sores. A foot exam by a caregiver should be done annually.  Brush your teeth and gums at least twice a day and floss at least once a day. Follow up with your dentist regularly.  Share your diabetes management plan with your workplace or school.  Stay up-to-date with immunizations.  Learn to manage stress.  Obtain ongoing diabetes education and support as needed.  Participate in, or seek rehabilitation as needed to maintain or improve independence and quality of life. Request a physical or occupational therapy referral if you are having foot or hand numbness or difficulties with grooming,   dressing, eating, or physical activity. SEEK MEDICAL CARE IF:   You are unable to eat food or drink fluids for more than 6 hours.  You have nausea and vomiting for more than 6 hours.  Your blood glucose level is over 240 mg/dL.  There is a change in mental status.  You develop an additional serious illness.  You have diarrhea for more than 6 hours.  You have been sick or have had a fever for a couple of days and are not getting better.  You have pain during any physical activity.  SEEK IMMEDIATE MEDICAL CARE IF:  You have difficulty breathing.  You have moderate to large ketone levels. MAKE SURE YOU:  Understand these instructions.  Will watch your condition.  Will get help right away if  you are not doing well or get worse. Document Released: 11/28/2005 Document Revised: 08/22/2012 Document Reviewed: 06/26/2012 Exit[employer] Patient Information 2014 [employer].  

## 2014-02-10 NOTE — Progress Notes (Signed)
Subjective:    Patient ID: Katelyn Sparks, female    DOB: December 17, 1934, 79 y.o.   MRN: [id number]  Diabetes She presents for her follow-up diabetic visit. She has type 2 diabetes mellitus. Her disease course has been stable. There are no hypoglycemic associated symptoms. Pertinent negatives for diabetes include no blurred vision, no chest pain, no fatigue, no foot paresthesias, no foot ulcerations, no polydipsia, no polyphagia, no polyuria, no visual change, no weakness and no weight loss. There are no hypoglycemic complications. There are no diabetic complications. Current diabetic treatment includes diet. She is compliant with treatment all of the time. Her weight is stable. She is following a generally healthy diet. Meal planning includes avoidance of concentrated sweets. She has not had a previous visit with a dietician. She participates in exercise intermittently. There is no change in her home blood glucose trend. An ACE inhibitor/angiotensin II receptor blocker is not being taken. She does not see a podiatrist.Eye exam is not current.      Review of Systems  Constitutional: Negative.  Negative for fever, chills, weight loss, diaphoresis, appetite change and fatigue.  HENT: Negative.   Eyes: Negative.  Negative for blurred vision.  Respiratory: Negative.   Cardiovascular: Negative.  Negative for chest pain, palpitations and leg swelling.  Gastrointestinal: Positive for constipation. Negative for nausea, vomiting, abdominal pain, diarrhea, blood in stool, abdominal distention, anal bleeding and rectal pain.  Endocrine: Negative.  Negative for polydipsia, polyphagia and polyuria.  Genitourinary: Negative.   Musculoskeletal: Negative.   Skin: Negative.   Allergic/Immunologic: Negative.   Neurological: Negative.  Negative for weakness.  Hematological: Negative.  Negative for adenopathy. Does not bruise/bleed easily.  Psychiatric/Behavioral: Negative.        Objective:   Physical Exam    Vitals reviewed. Constitutional: She is oriented to person, place, and time. She appears well-developed and well-nourished. No distress.  HENT:  Head: Normocephalic and atraumatic.  Mouth/Throat: Oropharynx is clear and moist. No oropharyngeal exudate.  Eyes: Conjunctivae are normal. Right eye exhibits no discharge. Left eye exhibits no discharge. No scleral icterus.  Neck: Normal range of motion. Neck supple. No JVD present. No tracheal deviation present. No thyromegaly present.  Cardiovascular: Normal rate, regular rhythm, normal heart sounds and intact distal pulses.  Exam reveals no gallop and no friction rub.   No murmur heard. Pulmonary/Chest: Effort normal and breath sounds normal. No stridor. No respiratory distress. She has no wheezes. She has no rales. She exhibits no tenderness.  Abdominal: Soft. Bowel sounds are normal. She exhibits no distension and no mass. There is no tenderness. There is no rebound and no guarding.  Musculoskeletal: Normal range of motion. She exhibits no edema and no tenderness.  Lymphadenopathy:    She has no cervical adenopathy.  Neurological: She is oriented to person, place, and time.  Skin: Skin is warm and dry. No rash noted. She is not diaphoretic. No erythema. No pallor.     Lab Results  Component Value Date   WBC 4.5 08/28/2013   HGB 12.7 08/28/2013   HCT 36.7 08/28/2013   PLT 213.0 08/28/2013   GLUCOSE 95 08/28/2013   CHOL 262* 08/28/2013   TRIG 185.0* 08/28/2013   HDL 66.10 08/28/2013   LDLDIRECT 172.6 08/28/2013   ALT 13 08/28/2013   AST 18 08/28/2013   NA 138 08/28/2013   K 3.4* 08/28/2013   CL 102 08/28/2013   CREATININE 1.0 08/28/2013   BUN 14 08/28/2013   CO2 31 08/28/2013  TSH 2.320 09/13/2013   HGBA1C 6.6* 08/28/2013       Assessment & Plan:

## 2014-02-25 ENCOUNTER — Ambulatory Visit (INDEPENDENT_AMBULATORY_CARE_PROVIDER_SITE_OTHER): Payer: Medicare Other | Admitting: Internal Medicine

## 2014-02-25 ENCOUNTER — Encounter: Payer: Self-pay | Admitting: Internal Medicine

## 2014-02-25 VITALS — BP 118/60 | HR 60 | Ht 66.0 in | Wt 130.6 lb

## 2014-02-25 DIAGNOSIS — R198 Other specified symptoms and signs involving the digestive system and abdomen: Secondary | ICD-10-CM

## 2014-02-25 NOTE — Patient Instructions (Signed)
You have been scheduled for a flexible sigmoidoscopy. Please follow the written instructions given to you at your visit today. If you use inhalers (even only as needed), please bring them with you on the day of your procedure.   I appreciate the opportunity to care for you.  

## 2014-02-25 NOTE — Assessment & Plan Note (Signed)
Has some normal stools but this persists so will check flex sig. The risks and benefits as well as alternatives of endoscopic procedure(s) have been discussed and reviewed. All questions answered. The patient agrees to proceed.

## 2014-02-25 NOTE — Progress Notes (Signed)
         Subjective:    Patient ID: Katelyn Sparks, female    DOB: 06/19/1935, 79 y.o.   MRN: [id number]  HPI The patient returns with persistent intermittent thin caliber stools. She is using MiraLax prn.  No bleeding. Remains concerned about the narrow-caliber stools. Last colonoscopy 2011.   Medications, allergies, past medical history, past surgical history, family history and social history are reviewed and updated in the EMR.  Review of Systems As above    Objective:   Physical Exam WDWN petite NAD    Assessment & Plan:  Change in bowel movement-thin stools Has some normal stools but this persists so will check flex sig. The risks and benefits as well as alternatives of endoscopic procedure(s) have been discussed and reviewed. All questions answered. The patient agrees to proceed.

## 2014-03-10 ENCOUNTER — Ambulatory Visit: Payer: Medicare Other | Attending: Neurology

## 2014-03-13 ENCOUNTER — Other Ambulatory Visit: Payer: Medicare Other | Admitting: Internal Medicine

## 2014-04-10 ENCOUNTER — Telehealth: Payer: Self-pay | Admitting: Internal Medicine

## 2014-04-10 NOTE — Telephone Encounter (Signed)
Patient's son is concerned about his mother.  I explained to him that his mother was scheduled for a flex sig.  She cancelled the appt.  I have asked him to have his mother call back to reschedule.  He states he will discuss with his mom and call back

## 2014-04-18 ENCOUNTER — Ambulatory Visit (INDEPENDENT_AMBULATORY_CARE_PROVIDER_SITE_OTHER): Payer: Medicare Other | Admitting: Internal Medicine

## 2014-04-18 ENCOUNTER — Encounter: Payer: Self-pay | Admitting: Internal Medicine

## 2014-04-18 ENCOUNTER — Telehealth: Payer: Self-pay | Admitting: Internal Medicine

## 2014-04-18 VITALS — BP 126/64 | HR 64 | Temp 98.5°F | Resp 16 | Ht 66.0 in | Wt 128.2 lb

## 2014-04-18 DIAGNOSIS — R7309 Other abnormal glucose: Secondary | ICD-10-CM | POA: Diagnosis not present

## 2014-04-18 DIAGNOSIS — E785 Hyperlipidemia, unspecified: Secondary | ICD-10-CM | POA: Diagnosis not present

## 2014-04-18 DIAGNOSIS — F068 Other specified mental disorders due to known physiological condition: Secondary | ICD-10-CM

## 2014-04-18 DIAGNOSIS — F0390 Unspecified dementia without behavioral disturbance: Secondary | ICD-10-CM

## 2014-04-18 MED ORDER — MEMANTINE HCL ER 28 MG PO CP24: 1.0000 | ORAL_CAPSULE | Freq: Every day | ORAL | Status: DC

## 2014-04-18 NOTE — Progress Notes (Signed)
Pre visit review using our clinic review tool, if applicable. No additional management support is needed unless otherwise documented below in the visit note. 

## 2014-04-18 NOTE — Progress Notes (Signed)
Subjective:    Patient ID: Katelyn Sparks [place], female    DOB: 07/03/1935, 79 y.o.   MRN: [id number]  HPI Comments: She returns today with her daughter-in-law and she tells me that the family is concerned about her worsening memory, the think she forgets to eat and is losing weight. There is a grandchild that lives with her that helps some with the ADL's.     Review of Systems  Constitutional: Positive for unexpected weight change (2# weight loss). Negative for fever, chills, diaphoresis, activity change, appetite change and fatigue.  HENT: Negative.   Eyes: Negative.   Respiratory: Negative.  Negative for cough, choking, chest tightness, shortness of breath, wheezing and stridor.   Cardiovascular: Negative.  Negative for chest pain, palpitations and leg swelling.  Gastrointestinal: Negative.  Negative for nausea, vomiting, abdominal pain, diarrhea, constipation and blood in stool.  Endocrine: Negative.   Genitourinary: Negative.   Musculoskeletal: Negative.  Negative for arthralgias, back pain and myalgias.  Skin: Negative.   Allergic/Immunologic: Negative.   Neurological: Negative.   Hematological: Negative.  Negative for adenopathy. Does not bruise/bleed easily.  Psychiatric/Behavioral: Positive for confusion and decreased concentration. Negative for suicidal ideas, hallucinations, behavioral problems, sleep disturbance, self-injury, dysphoric mood and agitation. The patient is not nervous/anxious and is not hyperactive.        Objective:   Physical Exam  Vitals reviewed. Constitutional: She is oriented to person, place, and time. She appears well-developed and well-nourished. No distress.  HENT:  Head: Normocephalic and atraumatic.  Mouth/Throat: Oropharynx is clear and moist. No oropharyngeal exudate.  Eyes: Conjunctivae are normal. Right eye exhibits no discharge. Left eye exhibits no discharge. No scleral icterus.  Neck: Normal range of motion. Neck supple. No JVD present. No  tracheal deviation present. No thyromegaly present.  Cardiovascular: Normal rate, regular rhythm, normal heart sounds and intact distal pulses.  Exam reveals no gallop and no friction rub.   No murmur heard. Pulmonary/Chest: Effort normal and breath sounds normal. No stridor. No respiratory distress. She has no wheezes. She has no rales. She exhibits no tenderness.  Abdominal: Soft. Bowel sounds are normal. She exhibits no distension and no mass. There is no tenderness. There is no rebound and no guarding.  Musculoskeletal: Normal range of motion. She exhibits no edema and no tenderness.  Lymphadenopathy:    She has no cervical adenopathy.  Neurological: She is oriented to person, place, and time.  Skin: Skin is warm and dry. No rash noted. She is not diaphoretic. No erythema. No pallor.  Psychiatric: Judgment and thought content normal. Her mood appears not anxious. Her affect is not angry, not blunt, not labile and not inappropriate. Her speech is delayed. Her speech is not tangential and not slurred. She is withdrawn. She is not agitated, not aggressive, not hyperactive, not slowed, not actively hallucinating and not combative. Cognition and memory are impaired. She does not exhibit a depressed mood. She is communicative. She exhibits abnormal recent memory and abnormal remote memory. She is inattentive.     Lab Results  Component Value Date   WBC 4.5 08/28/2013   HGB 12.7 08/28/2013   HCT 36.7 08/28/2013   PLT 213.0 08/28/2013   GLUCOSE 100* 02/10/2014   CHOL 262* 08/28/2013   TRIG 185.0* 08/28/2013   HDL 66.10 08/28/2013   LDLDIRECT 172.6 08/28/2013   ALT 13 08/28/2013   AST 18 08/28/2013   NA 138 02/10/2014   K 4.1 02/10/2014   CL 103 02/10/2014   CREATININE 0.8  02/10/2014   BUN 11 02/10/2014   CO2 30 02/10/2014   TSH 2.320 09/13/2013   HGBA1C 6.3 02/10/2014        Assessment & Plan:

## 2014-04-18 NOTE — Patient Instructions (Signed)
Gastroesophageal Reflux Disease, Adult  Gastroesophageal reflux disease (GERD) happens when acid from your stomach flows up into the esophagus. When acid comes in contact with the esophagus, the acid causes soreness (inflammation) in the esophagus. Over time, GERD may create small holes (ulcers) in the lining of the esophagus.  CAUSES   · Increased body weight. This puts pressure on the stomach, making acid rise from the stomach into the esophagus.  · Smoking. This increases acid production in the stomach.  · Drinking alcohol. This causes decreased pressure in the lower esophageal sphincter (valve or ring of muscle between the esophagus and stomach), allowing acid from the stomach into the esophagus.  · Late evening meals and a full stomach. This increases pressure and acid production in the stomach.  · A malformed lower esophageal sphincter.  Sometimes, no cause is found.  SYMPTOMS   · Burning pain in the lower part of the mid-chest behind the breastbone and in the mid-stomach area. This may occur twice a week or more often.  · Trouble swallowing.  · Sore throat.  · Dry cough.  · Asthma-like symptoms including chest tightness, shortness of breath, or wheezing.  DIAGNOSIS   Your caregiver may be able to diagnose GERD based on your symptoms. In some cases, X-rays and other tests may be done to check for complications or to check the condition of your stomach and esophagus.  TREATMENT   Your caregiver may recommend over-the-counter or prescription medicines to help decrease acid production. Ask your caregiver before starting or adding any new medicines.   HOME CARE INSTRUCTIONS   · Change the factors that you can control. Ask your caregiver for guidance concerning weight loss, quitting smoking, and alcohol consumption.  · Avoid foods and drinks that make your symptoms worse, such as:  · Caffeine or alcoholic drinks.  · Chocolate.  · Peppermint or mint flavorings.  · Garlic and onions.  · Spicy foods.  · Citrus fruits,  such as oranges, lemons, or limes.  · Tomato-based foods such as sauce, chili, salsa, and pizza.  · Fried and fatty foods.  · Avoid lying down for the 3 hours prior to your bedtime or prior to taking a nap.  · Eat small, frequent meals instead of large meals.  · Wear loose-fitting clothing. Do not wear anything tight around your waist that causes pressure on your stomach.  · Raise the head of your bed 6 to 8 inches with wood blocks to help you sleep. Extra pillows will not help.  · Only take over-the-counter or prescription medicines for pain, discomfort, or fever as directed by your caregiver.  · Do not take aspirin, ibuprofen, or other nonsteroidal anti-inflammatory drugs (NSAIDs).  SEEK IMMEDIATE MEDICAL CARE IF:   · You have pain in your arms, neck, jaw, teeth, or back.  · Your pain increases or changes in intensity or duration.  · You develop nausea, vomiting, or sweating (diaphoresis).  · You develop shortness of breath, or you faint.  · Your vomit is green, yellow, black, or looks like coffee grounds or blood.  · Your stool is red, bloody, or black.  These symptoms could be signs of other problems, such as heart disease, gastric bleeding, or esophageal bleeding.  MAKE SURE YOU:   · Understand these instructions.  · Will watch your condition.  · Will get help right away if you are not doing well or get worse.  Document Released: 09/07/2005 Document Revised: 02/20/2012 Document Reviewed: 06/17/2011  [employer]® Patient   Information ©2014 [employer].

## 2014-04-18 NOTE — Telephone Encounter (Signed)
done

## 2014-04-21 ENCOUNTER — Telehealth: Payer: Self-pay | Admitting: *Deleted

## 2014-04-21 NOTE — Telephone Encounter (Signed)
Pt called requesting where she is to pick up medication.  [facility].  Unable to contact pt,, no answer no VM.

## 2014-04-22 ENCOUNTER — Encounter: Payer: Self-pay | Admitting: Neurology

## 2014-04-22 ENCOUNTER — Ambulatory Visit: Payer: Medicare Other | Admitting: Neurology

## 2014-04-22 ENCOUNTER — Telehealth: Payer: Self-pay | Admitting: Neurology

## 2014-04-22 NOTE — Assessment & Plan Note (Signed)
She is doing well on the statin

## 2014-04-22 NOTE — Telephone Encounter (Signed)
Pt no showed today's follow up appt w/ Dr. Patel. No show letter mailed to pt / Sherri S.  °

## 2014-04-22 NOTE — Assessment & Plan Note (Signed)
Will cont aricept but I think she should add namenda to retain/improve her cognitive skills

## 2014-04-22 NOTE — Assessment & Plan Note (Signed)
She has pre-diabetes Medications are not needed

## 2014-05-07 DIAGNOSIS — H04129 Dry eye syndrome of unspecified lacrimal gland: Secondary | ICD-10-CM | POA: Diagnosis not present

## 2014-05-07 DIAGNOSIS — H43399 Other vitreous opacities, unspecified eye: Secondary | ICD-10-CM | POA: Diagnosis not present

## 2014-05-07 DIAGNOSIS — H26499 Other secondary cataract, unspecified eye: Secondary | ICD-10-CM | POA: Diagnosis not present

## 2014-05-07 DIAGNOSIS — H02409 Unspecified ptosis of unspecified eyelid: Secondary | ICD-10-CM | POA: Diagnosis not present

## 2014-05-28 ENCOUNTER — Ambulatory Visit: Payer: Medicare Other | Admitting: Neurology

## 2014-05-28 ENCOUNTER — Encounter: Payer: Self-pay | Admitting: Neurology

## 2014-05-28 ENCOUNTER — Ambulatory Visit (INDEPENDENT_AMBULATORY_CARE_PROVIDER_SITE_OTHER): Payer: Medicare Other | Admitting: Neurology

## 2014-05-28 VITALS — BP 122/64 | HR 66 | Temp 97.2°F | Ht 66.0 in | Wt 123.0 lb

## 2014-05-28 DIAGNOSIS — G309 Alzheimer's disease, unspecified: Secondary | ICD-10-CM | POA: Diagnosis not present

## 2014-05-28 DIAGNOSIS — F0280 Dementia in other diseases classified elsewhere without behavioral disturbance: Secondary | ICD-10-CM

## 2014-05-28 MED ORDER — DONEPEZIL HCL 10 MG PO TABS: 10.0000 mg | ORAL_TABLET | Freq: Every day | ORAL | Status: DC

## 2014-05-28 NOTE — Progress Notes (Signed)
Subjective:    Patient ID: Katelyn Sparks is a 79 y.o. female.  HPI     Interim history:  Katelyn Sparks is a very pleasant 79 year old right-handed woman with an underlying medical history of diabetes, reflux disease, and left shoulder pain, who presents for followup consultation of her memory loss, probable Alzheimer's disease. The patient is accompanied by her son, Dallas Breeding, again today and I also talked to her other son Lennette Bihari?) on the phone. I last saw her on 01/13/14, at which time I started her on Aricept 5 mg. In the interim, in may of the sure she was seen by her primary care physician who started her on Namenda long-acting. She is supposed to be on 28 mg once daily but her son is not completely sure if she is taking it. The patient is not able to tell and her other son did not now either.   Today, her son reports that she is doing about the same. She has perhaps stabilized. She has been able to tolerate Aricept 5 mg once daily. The patient herself has no new complaints. She resides at home with her 53 year old grandson, Katelyn Sparks. She still drives some. Her son let her drive to the appointment today and states that she did okay. Most of the time, especially when she has to go to an appointment her son will take her. In the interim, on 04/03/2014 she was seen by Dr. Tyrone Apple sent for neurocognitive testing. His conclusions were mild dementia without behavioral complications, she lacks insight into her cognitive impairment, etiology could be primary cortical dementia or perhaps a mixed cortical-vascular dementia. He recommended line of site supervision while cooking given the safety risk associated with memory impairment, bill paying should be converted to automatic pink drafting where possible. Her family was advised to regularly monitor her driving competency. He recommended this finding someone to have power of attorney status over her health care decision making and finances.   I first met her on  09/13/2013 at which time she was accompanied by her friend [name]. The patient reported a history of memory problems for about one year including forgetfulness, misplacing things, easy frustration. I suggested further workup in the form of neuropsychological testing as well as MRI brain. She had a brain MRI without contrast on 01/09/2014: Abnormal MRI brain (without) demonstrating: 1. Moderate periventricular and subcortical chronic small vessel ischemic disease. 2. Punctate left parietal convexity chronic cerebral microhemorrhage. In addition, personally reviewed the images through the PACS system and explained the findings to the patient and her son.  Regarding her cognitive testing, received a message from [name] from Dr. Monico Hoar office on 12/10/13: "Katelyn Sparks dis not show for her appointment on 12/09/13 for neuropsychological evaluation. When contacted, she seemed confused and claimed no recollection of having made the appointment. She agreed to come in later the same morning but again did not show. Called her. She stated she went to the wrong place. She re-scheduled for 12/16/13. She called on 12/10/13 to cancel that appointment and did not wish to re-schedule."  She lives in her own home, is divorced and her 77 yo GS lives with her. This causes her stress at times. She drives, and has not gotten lost driving. She has no VH or AH, no SI/HI, no paranoid delusions, no Hx of inappropriate behavior. Her father had dementia and died at the age of 32. He mother lived to be 67 and had no dementia. She had a total of 13 siblings,  3 died in childhood. No siblings with memory loss.  The patient denies prior TIA or stroke symptoms, such as sudden onset of one sided weakness, numbness, tingling, slurring of speech or droopy face, hearing loss, tinnitus, diplopia or visual field cut or monocular loss of vision, and denies recurrent headaches. No falls, no head injury, no snoring was reported. She worked on a  farm, and has a 12 th grade education.  Blood work from 08/28/13: N CBC, N CMP and HbA1c of 6.6 and elevated cholesterol at 262 and LDL of 172.     Her Past Medical History Is Significant For: Past Medical History  Diagnosis Date  . Osteoarthrosis, unspecified whether generalized or localized, unspecified site   . Unspecified hemorrhoids without mention of complication   . Flatulence, eructation, and gas pain   . Esophageal reflux   . Blood in stool   . Dementia     Her Past Surgical History Is Significant For: Past Surgical History  Procedure Laterality Date  . Abdominal hysterectomy    . Colonoscopy  11/2010  . Upper gi endoscopy  11/2010    Her Family History Is Significant For: Family History  Problem Relation Age of Onset  . Diabetes    . Hypertension Mother   . Colon cancer Neg Hx   . Cancer Neg Hx   . Early death Neg Hx   . Heart disease Neg Hx   . Hyperlipidemia Neg Hx   . Kidney disease Neg Hx   . Learning disabilities Neg Hx   . Stroke Neg Hx   . Diabetes Mother   . Diabetes Sister   . Diabetes Brother     Her Social History Is Significant For: History   Social History  . Marital Status: Widowed    Spouse Name: N/A    Number of Children: 2  . Years of Education: 12   Occupational History  . Retired    Social History Main Topics  . Smoking status: Never Smoker   . Smokeless tobacco: Never Used  . Alcohol Use: No  . Drug Use: No  . Sexual Activity: Not Currently   Other Topics Concern  . None   Social History Narrative   She is a retired from a [place].   Her husband passed away in 02/18/2000.  She has two son.   Her grandson lives with her.    Her Allergies Are:  No Known Allergies:   Her Current Medications Are:  Outpatient Encounter Prescriptions as of 05/28/2014  Medication Sig  . atorvastatin (LIPITOR) 80 MG tablet Take 1 tablet (80 mg total) by mouth daily.  Marland Kitchen donepezil (ARICEPT) 5 MG tablet Take 1 tablet (5 mg total) by mouth  at bedtime.  . Memantine HCl ER (NAMENDA XR) 28 MG CP24 Take 28 mg by mouth daily.  Marland Kitchen omeprazole (PRILOSEC) 40 MG capsule Take 1 capsule (40 mg total) by mouth daily.  . ondansetron (ZOFRAN ODT) 4 MG disintegrating tablet Take 1 tablet (4 mg total) by mouth every 8 (eight) hours as needed for nausea.  . polyethylene glycol (MIRALAX / GLYCOLAX) packet Take 17 g by mouth daily as needed.  :  Review of Systems:  Out of a complete 14 point review of systems, all are reviewed and negative with the exception of these symptoms as listed below:   Review of Systems  Constitutional: Positive for unexpected weight change.  HENT: Negative.   Eyes: Negative.   Respiratory: Negative.   Cardiovascular: Negative.  Gastrointestinal: Negative.   Endocrine: Negative.   Musculoskeletal: Negative.   Skin: Negative.   Allergic/Immunologic: Negative.   Neurological:       Memory loss  Hematological: Negative.   Psychiatric/Behavioral: Positive for confusion, sleep disturbance (insomnia) and agitation. The patient is nervous/anxious.     Objective:  Neurologic Exam  Physical Exam Physical Examination:   Filed Vitals:   05/28/14 1152  BP: 122/64  Pulse: 66  Temp: 97.2 F (36.2 C)   General Examination: The patient is a very pleasant 79 y.o. female in no acute distress. She is calm and cooperative with the exam. She denies Auditory Hallucinations and Visual Hallucinations. She is well groomed and situated in a chair.   HEENT: Normocephalic, atraumatic, pupils are equal, round and reactive to light and accommodation. Funduscopic exam is normal with sharp disc margins noted. Extraocular tracking shows mild saccadic breakdown without nystagmus noted. Hearing is impaired mildly. Face is symmetric with no facial masking and normal facial sensation. There is no lip, neck or jaw tremor. Neck is mildly rigid with intact passive ROM. There are no carotid bruits on auscultation. Oropharynx exam reveals mild  mouth dryness. Moderate airway crowding is noted. Mallampati is class III. Tongue protrudes centrally and palate elevates symmetrically.    Chest: is clear to auscultation without wheezing, rhonchi or crackles noted.  Heart: sounds are regular and normal without murmurs, rubs or gallops noted.   Abdomen: is soft, non-tender and non-distended with normal bowel sounds appreciated on auscultation.  Extremities: There is no pitting edema in the distal lower extremities bilaterally. Pedal pulses are intact.   Skin: is warm and dry with no trophic changes noted. Age-related changes are noted on the skin.   Musculoskeletal: exam reveals no obvious joint deformities, tenderness or joint swelling or erythema.   Neurologically:  Mental status: The patient is awake and alert, paying good  attention. She is able to partially provide the history. Her friend provides some details. She is oriented to: person, place, situation, day of week and year. Her memory, attention, language and knowledge are impaired. There is no aphasia, agnosia, apraxia or anomia. There is a mild degree of bradyphrenia. Speech is mildly hypophonic with no dysarthria noted. Mood is congruent and affect is normal.   On 09/13/13: Her MMSE (Mini-Mental state exam) score was 21/30, CDT (Clock Drawing Test) score was 4/4 and AFT (Animal Fluency Test) score was 10. On 05/28/14: Her MMSE: 20/30, CDT: 4/4, AFT: 9   Cranial nerves are as described above under HEENT exam. In addition, shoulder shrug is normal with equal shoulder height noted.  Motor exam: Normal bulk, and strength for age is noted. Tone is not rigid with absence of cogwheeling in the extremities. There is overall no significant bradykinesia. There is no drift or rebound. There is no tremor.   Romberg is negative. Reflexes are 1+ in the upper extremities and 1+ in the lower extremities. Toes are downgoing bilaterally. Fine motor skills: Finger taps, hand movements, and rapid  alternating patting are not impaired bilaterally. Foot taps and foot agility are not impaired bilaterally.   Cerebellar testing shows no dysmetria or intention tremor on finger to nose testing. There is no truncal or gait ataxia.   Sensory exam is intact to light touch, pinprick, vibration, temperature sense in the upper and lower extremities.   Gait, station and balance: She stands up from the seated position with no difficulty and needs no assistance. No veering to one side is noted. No leaning  to one side. Posture is mildly stooped, age-appropriate. Stance is narrow-based. She turns en bloc. Tandem walk is not easily possible. Balance is mildly impaired.   Assessment and Plan:   In summary, ISZABELLA HEBENSTREIT is a very pleasant 79 year-old female with an underlying medical history of HLP, reflux disease, and left shoulder pain, who has an over 1 year history of memory loss. Her history and physical exam are keeping with memory loss likely beyond MCI, possibly early dementia of the Alzheimer's type vs mixed dementia, without evidence of behavioral disturbance. She has moderate WM disease on her brain MRI as well as chronic microhemorrhage on the L. We will do a CTH down the road to monitor. She had a mild increase in ammonia, and upon recheck it was fine. She had neuropsychological evaluation in April of this year which was in keeping with mild dementia. She is unable to tolerate Aricept 5 mg once daily and per primary care physician she is supposed to be on Namenda long-acting 28 mg once daily. I have asked her son to call if I can verify that she is taking this medication. Today I would also like to increase her Aricept to 10 mg once daily.  I had a long chat with the patient and her son(s) about my findings and the diagnosis of memory loss and dementia, its prognosis and treatment options. Cedric lives locally, and her other son is in [place], [place]. We talked maintaining a healthy lifestyle in general and  staying active mentally and physically. I encouraged the patient to eat healthy, exercise daily and keep well hydrated, to keep a scheduled bedtime and wake time routine, to not skip any meals and eat healthy snacks in between meals and to have protein with every meal. I stressed the importance of regular exercise, within of course the patient's own mobility limitations. I encouraged the patient to keep up with current events by reading the news paper or watching the news and to do word puzzles, or if feasible, to go on [email or web address]. She is encouraged to supplement her meals with protein milkshakes or protein bars. She may want to eat more frequently if she is not eating a big portion at any point in time.  I answered all their questions today and the patient and her son were in agreement with the above outlined plan. I would like to see the patient back in 4 months, sooner if the need arises and encouraged her to call with any interim questions, concerns, problems, updates and test results. Her son is also reminded to double check at home if she has been taking Namenda XR 28 mg daily.

## 2014-05-28 NOTE — Patient Instructions (Addendum)
Please call us back and report if you are indeed taking Namenda XR 28 mg once daily. If you are on it, continue with it.   I will increase your Aricept (donepezil) from 5 mg to 10 mg once daily at night. Common side effects include dry eyes, dry mouth, confusion, low pulse, low blood pressure and rare side effects include hallucinations, seizures and fainting. Please have your family (son, Sharyl NimrodCedric and grandson, CJ) watch for side effects.

## 2014-05-29 ENCOUNTER — Telehealth: Payer: Self-pay | Admitting: Neurology

## 2014-05-29 NOTE — Telephone Encounter (Signed)
Katelyn Sparks Katelyn Sparks(Kelvin) requesting a note for work, stating he needs time off from work in order to take care of mother affairs.  Please call and advise.  [phone]

## 2014-05-30 NOTE — Telephone Encounter (Signed)
Talked with patient he stated he will get a form for FMLA and submit it to DR. Frances FurbishAthar

## 2014-06-30 ENCOUNTER — Ambulatory Visit: Payer: Medicare Other | Admitting: Internal Medicine

## 2014-07-21 ENCOUNTER — Other Ambulatory Visit: Payer: Self-pay | Admitting: Internal Medicine

## 2014-07-28 ENCOUNTER — Ambulatory Visit: Payer: Medicare Other | Admitting: Neurology

## 2014-08-20 ENCOUNTER — Encounter: Payer: Self-pay | Admitting: Internal Medicine

## 2014-08-20 ENCOUNTER — Other Ambulatory Visit (INDEPENDENT_AMBULATORY_CARE_PROVIDER_SITE_OTHER): Payer: Medicare Other

## 2014-08-20 ENCOUNTER — Ambulatory Visit (INDEPENDENT_AMBULATORY_CARE_PROVIDER_SITE_OTHER): Payer: Medicare Other | Admitting: Internal Medicine

## 2014-08-20 VITALS — BP 110/74 | HR 61 | Temp 98.6°F | Resp 16 | Ht 66.0 in | Wt 121.8 lb

## 2014-08-20 DIAGNOSIS — K2100 Gastro-esophageal reflux disease with esophagitis, without bleeding: Secondary | ICD-10-CM

## 2014-08-20 DIAGNOSIS — Z23 Encounter for immunization: Secondary | ICD-10-CM

## 2014-08-20 DIAGNOSIS — E785 Hyperlipidemia, unspecified: Secondary | ICD-10-CM | POA: Diagnosis not present

## 2014-08-20 DIAGNOSIS — F0390 Unspecified dementia without behavioral disturbance: Secondary | ICD-10-CM | POA: Insufficient documentation

## 2014-08-20 DIAGNOSIS — R7309 Other abnormal glucose: Secondary | ICD-10-CM

## 2014-08-20 DIAGNOSIS — F068 Other specified mental disorders due to known physiological condition: Secondary | ICD-10-CM

## 2014-08-20 DIAGNOSIS — M75102 Unspecified rotator cuff tear or rupture of left shoulder, not specified as traumatic: Secondary | ICD-10-CM

## 2014-08-20 DIAGNOSIS — G4701 Insomnia due to medical condition: Secondary | ICD-10-CM | POA: Diagnosis not present

## 2014-08-20 LAB — COMPREHENSIVE METABOLIC PANEL
ALT: 18 U/L (ref 0–35)
AST: 18 U/L (ref 0–37)
Albumin: 4.1 g/dL (ref 3.5–5.2)
Alkaline Phosphatase: 80 U/L (ref 39–117)
BUN: 11 mg/dL (ref 6–23)
CO2: 30 mEq/L (ref 19–32)
Calcium: 9.7 mg/dL (ref 8.4–10.5)
Chloride: 104 mEq/L (ref 96–112)
Creatinine, Ser: 0.8 mg/dL (ref 0.4–1.2)
GFR: 85.65 mL/min (ref >60.00)
Glucose, Bld: 91 mg/dL (ref 70–99)
Potassium: 3.9 mEq/L (ref 3.5–5.1)
Sodium: 140 mEq/L (ref 135–145)
Total Bilirubin: 0.6 mg/dL (ref 0.2–1.2)
Total Protein: 7.8 g/dL (ref 6.0–8.3)

## 2014-08-20 LAB — CBC WITH DIFFERENTIAL/PLATELET
BASOS PCT: 0.2 % (ref 0.0–3.0)
Basophils Absolute: 0 10*3/uL (ref 0.0–0.1)
EOS ABS: 0.1 10*3/uL (ref 0.0–0.7)
Eosinophils Relative: 2.2 % (ref 0.0–5.0)
HCT: 40.7 % (ref 36.0–46.0)
HEMOGLOBIN: 13.9 g/dL (ref 12.0–15.0)
Lymphocytes Relative: 36 % (ref 12.0–46.0)
Lymphs Abs: 1.5 10*3/uL (ref 0.7–4.0)
MCHC: 34.2 g/dL (ref 30.0–36.0)
MCV: 86.8 fl (ref 78.0–100.0)
MONOS PCT: 6.4 % (ref 3.0–12.0)
Monocytes Absolute: 0.3 10*3/uL (ref 0.1–1.0)
NEUTROS ABS: 2.2 10*3/uL (ref 1.4–7.7)
Neutrophils Relative %: 55.2 % (ref 43.0–77.0)
Platelets: 250 10*3/uL (ref 150.0–400.0)
RBC: 4.69 Mil/uL (ref 3.87–5.11)
RDW: 14.1 % (ref 11.5–15.5)
WBC: 4.1 10*3/uL (ref 4.0–10.5)

## 2014-08-20 LAB — HEMOGLOBIN A1C: Hgb A1c MFr Bld: 6.1 % (ref 4.6–6.5)

## 2014-08-20 LAB — LIPID PANEL
CHOL/HDL RATIO: 3
Cholesterol: 289 mg/dL — ABNORMAL HIGH (ref 0–200)
HDL: 88.6 mg/dL (ref >39.00)
LDL CALC: 185 mg/dL — AB (ref 0–99)
NONHDL: 200.4
Triglycerides: 77 mg/dL (ref 0.0–149.0)
VLDL: 15.4 mg/dL (ref 0.0–40.0)

## 2014-08-20 LAB — TSH: TSH: 1.84 u[IU]/mL (ref 0.35–4.50)

## 2014-08-20 MED ORDER — MEMANTINE HCL-DONEPEZIL HCL ER 28-10 MG PO CP24: 1.0000 | ORAL_CAPSULE | Freq: Every day | ORAL | Status: DC

## 2014-08-20 MED ORDER — SUVOREXANT 10 MG PO TABS: 1.0000 | ORAL_TABLET | Freq: Every day | ORAL | Status: DC

## 2014-08-20 NOTE — Patient Instructions (Signed)
Insomnia Insomnia is frequent trouble falling and/or staying asleep. Insomnia can be a long term problem or a short term problem. Both are common. Insomnia can be a short term problem when the wakefulness is related to a certain stress or worry. Long term insomnia is often related to ongoing stress during waking hours and/or poor sleeping habits. Overtime, sleep deprivation itself can make the problem worse. Every little thing feels more severe because you are overtired and your ability to cope is decreased. CAUSES   Stress, anxiety, and depression.  Poor sleeping habits.  Distractions such as TV in the bedroom.  Naps close to bedtime.  Engaging in emotionally charged conversations before bed.  Technical reading before sleep.  Alcohol and other sedatives. They may make the problem worse. They can hurt normal sleep patterns and normal dream activity.  Stimulants such as caffeine for several hours prior to bedtime.  Pain syndromes and shortness of breath can cause insomnia.  Exercise late at night.  Changing time zones may cause sleeping problems (jet lag). It is sometimes helpful to have someone observe your sleeping patterns. They should look for periods of not breathing during the night (sleep apnea). They should also look to see how long those periods last. If you live alone or observers are uncertain, you can also be observed at a sleep clinic where your sleep patterns will be professionally monitored. Sleep apnea requires a checkup and treatment. Give your caregivers your medical history. Give your caregivers observations your family has made about your sleep.  SYMPTOMS   Not feeling rested in the morning.  Anxiety and restlessness at bedtime.  Difficulty falling and staying asleep. TREATMENT   Your caregiver may prescribe treatment for an underlying medical disorders. Your caregiver can give advice or help if you are using alcohol or other drugs for self-medication. Treatment  of underlying problems will usually eliminate insomnia problems.  Medications can be prescribed for short time use. They are generally not recommended for lengthy use.  Over-the-counter sleep medicines are not recommended for lengthy use. They can be habit forming.  You can promote easier sleeping by making lifestyle changes such as:  Using relaxation techniques that help with breathing and reduce muscle tension.  Exercising earlier in the day.  Changing your diet and the time of your last meal. No night time snacks.  Establish a regular time to go to bed.  Counseling can help with stressful problems and worry.  Soothing music and white noise may be helpful if there are background noises you cannot remove.  Stop tedious detailed work at least one hour before bedtime. HOME CARE INSTRUCTIONS   Keep a diary. Inform your caregiver about your progress. This includes any medication side effects. See your caregiver regularly. Take note of:  Times when you are asleep.  Times when you are awake during the night.  The quality of your sleep.  How you feel the next day. This information will help your caregiver care for you.  Get out of bed if you are still awake after 15 minutes. Read or do some quiet activity. Keep the lights down. Wait until you feel sleepy and go back to bed.  Keep regular sleeping and waking hours. Avoid naps.  Exercise regularly.  Avoid distractions at bedtime. Distractions include watching television or engaging in any intense or detailed activity like attempting to balance the household checkbook.  Develop a bedtime ritual. Keep a familiar routine of bathing, brushing your teeth, climbing into bed at the same   time each night, listening to soothing music. Routines increase the success of falling to sleep faster.  Use relaxation techniques. This can be using breathing and muscle tension release routines. It can also include visualizing peaceful scenes. You can  also help control troubling or intruding thoughts by keeping your mind occupied with boring or repetitive thoughts like the old concept of counting sheep. You can make it more creative like imagining planting one beautiful flower after another in your backyard garden.  During your day, work to eliminate stress. When this is not possible use some of the previous suggestions to help reduce the anxiety that accompanies stressful situations. MAKE SURE YOU:   Understand these instructions.  Will watch your condition.  Will get help right away if you are not doing well or get worse. Document Released: 11/25/2000 Document Revised: 02/20/2012 Document Reviewed: 12/26/2007 Exit[employer] Patient Information 2015 [employer]. This information is not intended to replace advice given to you by your health care provider. Make sure you discuss any questions you have with your health care provider.  

## 2014-08-20 NOTE — Progress Notes (Signed)
Pre visit review using our clinic review tool, if applicable. No additional management support is needed unless otherwise documented below in the visit note. 

## 2014-08-20 NOTE — Progress Notes (Signed)
Subjective:    Patient ID: Katelyn Sparks, female    DOB: [birth year], 79 y.o.   MRN: [id number]  Gastrophageal Reflux She complains of belching, heartburn and nausea. She reports no abdominal pain, no chest pain, no choking, no coughing, no dysphagia, no early satiety, no globus sensation, no hoarse voice, no sore throat, no stridor, no tooth decay, no water brash or no wheezing. This is a chronic problem. The current episode started more than 1 year ago. The problem occurs occasionally. The problem has been unchanged. The heartburn does not wake her from sleep. The heartburn does not limit her activity. The heartburn doesn't change with position. Nothing aggravates the symptoms. Pertinent negatives include no anemia, fatigue, melena, muscle weakness, orthopnea or weight loss. She has tried a PPI for the symptoms. The treatment provided moderate relief. Past procedures include an EGD.      Review of Systems  Constitutional: Negative.  Negative for fever, chills, weight loss, diaphoresis, appetite change and fatigue.  HENT: Negative.  Negative for hoarse voice and sore throat.   Eyes: Negative.   Respiratory: Negative.  Negative for cough, choking, chest tightness, shortness of breath, wheezing and stridor.   Cardiovascular: Negative.  Negative for chest pain, palpitations and leg swelling.  Gastrointestinal: Positive for heartburn and nausea. Negative for dysphagia, vomiting, abdominal pain, diarrhea, constipation, blood in stool, melena, abdominal distention, anal bleeding and rectal pain.  Endocrine: Negative.   Genitourinary: Negative.   Musculoskeletal: Negative.  Negative for arthralgias, back pain, joint swelling, muscle weakness and myalgias.  Allergic/Immunologic: Negative.   Neurological: Negative.   Hematological: Negative.  Negative for adenopathy. Does not bruise/bleed easily.  Psychiatric/Behavioral: Positive for confusion, sleep disturbance (DFA and FA) and decreased  concentration. Negative for suicidal ideas, behavioral problems, self-injury, dysphoric mood and agitation. The patient is not nervous/anxious and is not hyperactive.        She has been prescribed namenda and aricept for dementia but she tells me that she only takes one of these and she is not sure which one it is       Objective:   Physical Exam  Vitals reviewed. Constitutional: She is oriented to person, place, and time. She appears well-developed and well-nourished. No distress.  HENT:  Head: Normocephalic and atraumatic.  Mouth/Throat: Oropharynx is clear and moist. No oropharyngeal exudate.  Eyes: Conjunctivae are normal. Right eye exhibits no discharge. Left eye exhibits no discharge. No scleral icterus.  Neck: Normal range of motion. Neck supple. No JVD present. No tracheal deviation present. No thyromegaly present.  Cardiovascular: Normal rate, regular rhythm, normal heart sounds and intact distal pulses.  Exam reveals no gallop and no friction rub.   No murmur heard. Pulmonary/Chest: Effort normal and breath sounds normal. No stridor. No respiratory distress. She has no wheezes. She has no rales. She exhibits no tenderness.  Abdominal: Soft. Bowel sounds are normal. She exhibits no distension and no mass. There is no tenderness. There is no rebound and no guarding.  Musculoskeletal: Normal range of motion. She exhibits no edema and no tenderness.  Lymphadenopathy:    She has no cervical adenopathy.  Neurological: She is oriented to person, place, and time.  Skin: Skin is warm and dry. No rash noted. She is not diaphoretic. No erythema. No pallor.  Psychiatric: Judgment and thought content normal. Her mood appears anxious. Her affect is not angry, not blunt, not labile and not inappropriate. Her speech is delayed. Her speech is not rapid and/or pressured, not tangential  and not slurred. She is slowed. She is not withdrawn and not actively hallucinating. Cognition and memory are  impaired. She does not exhibit a depressed mood. She expresses no homicidal and no suicidal ideation. She expresses no suicidal plans and no homicidal plans. She is communicative. She exhibits abnormal recent memory and abnormal remote memory. She is inattentive.     Lab Results  Component Value Date   WBC 4.5 08/28/2013   HGB 12.7 08/28/2013   HCT 36.7 08/28/2013   PLT 213.0 08/28/2013   GLUCOSE 100* 02/10/2014   CHOL 262* 08/28/2013   TRIG 185.0* 08/28/2013   HDL 66.10 08/28/2013   LDLDIRECT 172.6 08/28/2013   ALT 13 08/28/2013   AST 18 08/28/2013   NA 138 02/10/2014   K 4.1 02/10/2014   CL 103 02/10/2014   CREATININE 0.8 02/10/2014   BUN 11 02/10/2014   CO2 30 02/10/2014   TSH 2.320 09/13/2013   HGBA1C 6.3 02/10/2014       Assessment & Plan:

## 2014-08-22 ENCOUNTER — Telehealth: Payer: Self-pay

## 2014-08-22 NOTE — Telephone Encounter (Signed)
Received pharmacy rejection stating that insurance will not cover Namzaric without a prior authorization. Thanks

## 2014-08-25 NOTE — Assessment & Plan Note (Signed)
Her LDL is higher than before She has not been taking the lipitor She will restart lipitor

## 2014-08-25 NOTE — Assessment & Plan Note (Signed)
I will consolidate her treatment for this to namzaric

## 2014-08-25 NOTE — Assessment & Plan Note (Signed)
She will try belsomra for the insomnia

## 2014-08-25 NOTE — Assessment & Plan Note (Signed)
Her GERD is adequately well controlled

## 2014-08-26 ENCOUNTER — Telehealth: Payer: Self-pay | Admitting: *Deleted

## 2014-08-26 DIAGNOSIS — M75100 Unspecified rotator cuff tear or rupture of unspecified shoulder, not specified as traumatic: Secondary | ICD-10-CM | POA: Diagnosis not present

## 2014-08-26 DIAGNOSIS — F0390 Unspecified dementia without behavioral disturbance: Secondary | ICD-10-CM | POA: Diagnosis not present

## 2014-08-26 NOTE — Telephone Encounter (Signed)
Nursing will be seeing pt once weekly x 4 wks for med management and nutrition. She is being evaluated for PT tomorrow.  Caller would like to add ST for memory training.

## 2014-08-27 DIAGNOSIS — F0390 Unspecified dementia without behavioral disturbance: Secondary | ICD-10-CM | POA: Diagnosis not present

## 2014-08-27 DIAGNOSIS — M75100 Unspecified rotator cuff tear or rupture of unspecified shoulder, not specified as traumatic: Secondary | ICD-10-CM | POA: Diagnosis not present

## 2014-08-27 NOTE — Telephone Encounter (Signed)
Ok with me 

## 2014-09-05 ENCOUNTER — Ambulatory Visit: Payer: Medicare Other | Admitting: Family Medicine

## 2014-09-08 ENCOUNTER — Other Ambulatory Visit (INDEPENDENT_AMBULATORY_CARE_PROVIDER_SITE_OTHER): Payer: Medicare Other

## 2014-09-08 ENCOUNTER — Ambulatory Visit (INDEPENDENT_AMBULATORY_CARE_PROVIDER_SITE_OTHER): Payer: Medicare Other | Admitting: Family Medicine

## 2014-09-08 ENCOUNTER — Encounter: Payer: Self-pay | Admitting: Family Medicine

## 2014-09-08 VITALS — BP 110/70 | HR 71 | Ht 66.0 in | Wt 119.0 lb

## 2014-09-08 DIAGNOSIS — M25512 Pain in left shoulder: Secondary | ICD-10-CM

## 2014-09-08 DIAGNOSIS — M67919 Unspecified disorder of synovium and tendon, unspecified shoulder: Secondary | ICD-10-CM

## 2014-09-08 DIAGNOSIS — M75102 Unspecified rotator cuff tear or rupture of left shoulder, not specified as traumatic: Secondary | ICD-10-CM

## 2014-09-08 DIAGNOSIS — M25519 Pain in unspecified shoulder: Secondary | ICD-10-CM

## 2014-09-08 DIAGNOSIS — M19012 Primary osteoarthritis, left shoulder: Secondary | ICD-10-CM

## 2014-09-08 DIAGNOSIS — M719 Bursopathy, unspecified: Secondary | ICD-10-CM

## 2014-09-08 DIAGNOSIS — M19019 Primary osteoarthritis, unspecified shoulder: Secondary | ICD-10-CM

## 2014-09-08 NOTE — Assessment & Plan Note (Signed)
Injected again today. She warned of post injection instructions. Patient will do some home exercises as well as icing. Patient will come back and see me again in 3-4 weeks for further evaluation and treatment.

## 2014-09-08 NOTE — Assessment & Plan Note (Signed)
Patient was given a corticosteroid injection again today. Patient tolerated the procedure very well. Patient did have increasing strength immediately which may speed the patient does not have a true tear but has had some progression of the arthritis which will need to monitor. We discussed the possibility of formal physical therapy which patient declined. We discussed icing protocol as well as home exercise program. Patient will do this as well as over-the-counter medications and come back and see me again in 3-4 weeks for further evaluation and treatment.  Spent greater than 25 minutes with patient face-to-face and had greater than 50% of counseling including as described above in assessment and plan.

## 2014-09-08 NOTE — Patient Instructions (Signed)
Very nice to meet you You have a rotator cuff tear but we will try to get it bewtter with exercises Also arthritis in the shoulder is a little worse then last time.  Ice 20 minutes 2 times daily. Usually after activity and before bed. Exercises 3 times a week.  Take tylenol 650 mg three times a day is the best evidence based medicine we have for arthritis.  Glucosamine sulfate  twice a day is a supplement that has been shown to help moderate to severe arthritis. Vitamin D 2000 IU daily Capsaicin topically up to four times a day may also help with pain. It's important that you continue to stay active. Consider physical therapy to strengthen muscles around the joint that hurts to take pressure off of the joint itself.  Come back and see me in 3-4 weeks.

## 2014-09-08 NOTE — Progress Notes (Signed)
[name] [address] [place], [place] [address] Phone: [phone] Subjective:        CC: Left shoulder pain  WNU:[id number] Katelyn Sparks is a 79 y.o. female coming in with complaint of left shoulder pain. Patient previously did have a subacromial bursitis as well as a.c. arthritis. Patient was given an injection in her a.c. joint previously and had significant improvement. Patient is coming in today with continued pain. Patient last injection was approximately one year ago. Patient states that the cortisone last couple months she started having worsening pain in. Hurts more when she does the overhead activity or lays on that side. It is waking her up at night. Please see previous notes dated September 30, 2013 for more of the past medical history. She denies any new injury, any radiation of pain down the arm but may be some weakness. Patient does have some dementia at baseline and is accompanied with a friend.     Past medical history, social, surgical and family history all reviewed in electronic medical record.   Review of Systems: No headache, visual changes, nausea, vomiting, diarrhea, constipation, dizziness, abdominal pain, skin rash, fevers, chills, night sweats, weight loss, swollen lymph nodes, body aches, joint swelling, muscle aches, chest pain, shortness of breath, mood changes.   Objective Blood pressure 110/70, pulse 71, height  (1.676 m), weight 119 lb (53.978 kg), SpO2 97.00%.  General: No apparent distress alert and oriented x3 mood and affect normal, dressed appropriately.  HEENT: Pupils equal, extraocular movements intact  Respiratory: Patient's speak in full sentences and does not appear short of breath  Cardiovascular: No lower extremity edema, non tender, no erythema  Skin: Warm dry intact with no signs of infection or rash on extremities or on axial skeleton.  Abdomen: Soft nontender  Neuro: Cranial nerves II through XII are  intact, neurovascularly intact in all extremities with 2+ DTRs and 2+ pulses.  Lymph: No lymphadenopathy of posterior or anterior cervical chain or axillae bilaterally.  Gait normal with good balance and coordination.  MSK:  Non tender with full range of motion and good stability and symmetric strength and tone of  elbows, wrist, hip, knee and ankles bilaterally.  Shoulder: left Inspection reveals no abnormalities, atrophy or asymmetry. Patient shows tenderness over the a.c. joint ROM is full in all planes passively. 4/5 compared to the contralateral side signs of impingement with positive Neer and Hawkin's tests, but negative empty can sign. Speeds and Yergason's tests normal. No labral pathology noted with negative Obrien's, negative clunk and good stability. Normal scapular function observed. Painful arc noted No apprehension sign  MSK US performed of: left This study was ordered, performed, and interpreted by Terrilee Files D.O.  Shoulder:   Supraspinatus:  Appears normal on long and transverse views, Bursal bulge seen with shoulder abduction on impingement view. Infraspinatus:  Appears normal on long and transverse views. Significant increase in Doppler flow Subscapularis:  Appears normal on long and transverse views. Positive bursa Teres Minor:  Appears normal on long and transverse views. AC joint:  Capsule undistended, no geyser sign. Glenohumeral Joint:  Moderate to severe arthritis and worse than previous exam. Glenoid Labrum:  Intact without visualized tears. Biceps Tendon:  Appears normal on long and transverse views, no fraying of tendon, tendon located in intertubercular groove, no subluxation with shoulder internal or external rotation.  Impression: Subacromial bursitis, worsening glenohumeral arthritis, a.c. joint arthritis  Procedure: Real-time Ultrasound Guided Injection of left glenohumeral  joint and acromioclavicular joint. Device: GE Logiq E  Ultrasound guided  injection is preferred based studies that show increased duration, increased effect, greater accuracy, decreased procedural pain, increased response rate with ultrasound guided versus blind injection.  Verbal informed consent obtained.  Time-out conducted.  Noted no overlying erythema, induration, or other signs of local infection.  Skin prepped in a sterile fashion.  Local anesthesia: Topical Ethyl chloride.  With sterile technique and under real time ultrasound guidance:  Joint visualized.  23g 1  inch needle inserted posterior approach. Pictures taken for needle placement. Patient did have injection of 2 cc of 1% lidocaine, 2 cc of 0.5% Marcaine, and 1.0 cc of Kenalog 40 mg/dL. patient then had an injection of the acromioclavicular joint with a 25-gauge 1 inch needle. We used 1 cc of 0.5% Marcaine and 1 cc of Kenalog 40 mg/dL. Patient tolerated 50% of the injection amount. This was done under ultrasound guidance as well. Completed without difficulty  Pain immediately resolved suggesting accurate placement of the medication.  Advised to call if fevers/chills, erythema, induration, drainage, or persistent bleeding.  Images permanently stored and available for review in the ultrasound unit.  Impression: Technically successful ultrasound guided injection.     Impression and Recommendations:     This case required medical decision making of moderate complexity.

## 2014-09-09 DIAGNOSIS — M75100 Unspecified rotator cuff tear or rupture of unspecified shoulder, not specified as traumatic: Secondary | ICD-10-CM | POA: Diagnosis not present

## 2014-09-09 DIAGNOSIS — F0390 Unspecified dementia without behavioral disturbance: Secondary | ICD-10-CM | POA: Diagnosis not present

## 2014-09-10 DIAGNOSIS — F0390 Unspecified dementia without behavioral disturbance: Secondary | ICD-10-CM | POA: Diagnosis not present

## 2014-09-10 DIAGNOSIS — M75100 Unspecified rotator cuff tear or rupture of unspecified shoulder, not specified as traumatic: Secondary | ICD-10-CM | POA: Diagnosis not present

## 2014-09-11 DIAGNOSIS — F0390 Unspecified dementia without behavioral disturbance: Secondary | ICD-10-CM | POA: Diagnosis not present

## 2014-09-11 DIAGNOSIS — M75100 Unspecified rotator cuff tear or rupture of unspecified shoulder, not specified as traumatic: Secondary | ICD-10-CM | POA: Diagnosis not present

## 2014-09-15 DIAGNOSIS — F0390 Unspecified dementia without behavioral disturbance: Secondary | ICD-10-CM | POA: Diagnosis not present

## 2014-09-15 DIAGNOSIS — M75100 Unspecified rotator cuff tear or rupture of unspecified shoulder, not specified as traumatic: Secondary | ICD-10-CM | POA: Diagnosis not present

## 2014-09-16 DIAGNOSIS — M75100 Unspecified rotator cuff tear or rupture of unspecified shoulder, not specified as traumatic: Secondary | ICD-10-CM | POA: Diagnosis not present

## 2014-09-16 DIAGNOSIS — F0390 Unspecified dementia without behavioral disturbance: Secondary | ICD-10-CM | POA: Diagnosis not present

## 2014-09-17 DIAGNOSIS — M75100 Unspecified rotator cuff tear or rupture of unspecified shoulder, not specified as traumatic: Secondary | ICD-10-CM | POA: Diagnosis not present

## 2014-09-17 DIAGNOSIS — F0390 Unspecified dementia without behavioral disturbance: Secondary | ICD-10-CM | POA: Diagnosis not present

## 2014-09-18 DIAGNOSIS — F0390 Unspecified dementia without behavioral disturbance: Secondary | ICD-10-CM | POA: Diagnosis not present

## 2014-09-18 DIAGNOSIS — M75100 Unspecified rotator cuff tear or rupture of unspecified shoulder, not specified as traumatic: Secondary | ICD-10-CM | POA: Diagnosis not present

## 2014-09-23 DIAGNOSIS — F0390 Unspecified dementia without behavioral disturbance: Secondary | ICD-10-CM | POA: Diagnosis not present

## 2014-09-23 DIAGNOSIS — M75100 Unspecified rotator cuff tear or rupture of unspecified shoulder, not specified as traumatic: Secondary | ICD-10-CM | POA: Diagnosis not present

## 2014-09-26 DIAGNOSIS — M75100 Unspecified rotator cuff tear or rupture of unspecified shoulder, not specified as traumatic: Secondary | ICD-10-CM | POA: Diagnosis not present

## 2014-09-26 DIAGNOSIS — F0390 Unspecified dementia without behavioral disturbance: Secondary | ICD-10-CM | POA: Diagnosis not present

## 2014-09-30 DIAGNOSIS — F0390 Unspecified dementia without behavioral disturbance: Secondary | ICD-10-CM | POA: Diagnosis not present

## 2014-09-30 DIAGNOSIS — M75100 Unspecified rotator cuff tear or rupture of unspecified shoulder, not specified as traumatic: Secondary | ICD-10-CM | POA: Diagnosis not present

## 2014-10-01 DIAGNOSIS — F0390 Unspecified dementia without behavioral disturbance: Secondary | ICD-10-CM | POA: Diagnosis not present

## 2014-10-01 DIAGNOSIS — M75100 Unspecified rotator cuff tear or rupture of unspecified shoulder, not specified as traumatic: Secondary | ICD-10-CM | POA: Diagnosis not present

## 2014-10-03 ENCOUNTER — Telehealth: Payer: Self-pay

## 2014-10-03 NOTE — Telephone Encounter (Signed)
PA initiated via phone for Namzaric 28-10 mg

## 2014-10-04 NOTE — Telephone Encounter (Signed)
PA was denied

## 2014-10-06 ENCOUNTER — Encounter: Payer: Self-pay | Admitting: Family Medicine

## 2014-10-06 ENCOUNTER — Ambulatory Visit (INDEPENDENT_AMBULATORY_CARE_PROVIDER_SITE_OTHER): Payer: Medicare Other | Admitting: Family Medicine

## 2014-10-06 VITALS — BP 116/68 | HR 53 | Ht 66.0 in | Wt 114.0 lb

## 2014-10-06 DIAGNOSIS — M75102 Unspecified rotator cuff tear or rupture of left shoulder, not specified as traumatic: Secondary | ICD-10-CM

## 2014-10-06 NOTE — Assessment & Plan Note (Signed)
She is doing significantly better at this time. I would encourage patient to continue with the conservative therapy. Patient is able to do all activities of daily living. Encourage her to do the exercises 3 times a week for another 6 weeks as well as the icing protocol. Lungs patient continues to do well she can follow-up on an as-needed basis. Patient knows we can do a critical steroid injection every 3 months if necessary.

## 2014-10-06 NOTE — Patient Instructions (Signed)
Good to see you Continue the soaks Ice is your friend at the end of the day.  Continue the exercises 3 times a week for 6 weeks.  Keep hands within your peripheral vision.  See me when you need me.

## 2014-10-06 NOTE — Progress Notes (Signed)
  Katelyn ScaleZach Smith D.O. [facility] [address] [place], [place] [address] Phone: [phone] Subjective:        CC: Left shoulder pain follow up   ION:[id number]:Subjective Stephania Fragminnnie P Dealmeida is a 79 y.o. female coming in with complaint of left shoulder pain. Patient previously did have a subacromial bursitis as well as a.c. arthritis. Patient was given an injection in her a.c. joint previously and had significant improvement. Patient was seen one month ago and did have another conical steroid injection into her shoulder. Patient states that she is doing approximately 90% better. Patient states still overhead activity gives her some mild pain but overall is doing significant well. Denies any radiation down her arm or any numbness. Doing the exercises fairly regularly. Patient likes taking baths which helped. Patient denies any new symptoms and states that she rest comfortably at night.     Past medical history, social, surgical and family history all reviewed in electronic medical record.   Review of Systems: No headache, visual changes, nausea, vomiting, diarrhea, constipation, dizziness, abdominal pain, skin rash, fevers, chills, night sweats, weight loss, swollen lymph nodes, body aches, joint swelling, muscle aches, chest pain, shortness of breath, mood changes.   Objective Blood pressure 116/68, pulse 53, height 5\' 6"  (1.676 m), weight 114 lb (51.71 kg), SpO2 97.00%.  General: No apparent distress alert and oriented x3 mood and affect normal, dressed appropriately.  HEENT: Pupils equal, extraocular movements intact  Respiratory: Patient's speak in full sentences and does not appear short of breath  Cardiovascular: No lower extremity edema, non tender, no erythema  Skin: Warm dry intact with no signs of infection or rash on extremities or on axial skeleton.  Abdomen: Soft nontender  Neuro: Cranial nerves II through XII are intact, neurovascularly intact in all extremities with 2+ DTRs and 2+  pulses.  Lymph: No lymphadenopathy of posterior or anterior cervical chain or axillae bilaterally.  Gait normal with good balance and coordination.  MSK:  Non tender with full range of motion and good stability and symmetric strength and tone of  elbows, wrist, hip, knee and ankles bilaterally.  Shoulder: left Inspection reveals no abnormalities, atrophy or asymmetry. Nontender on exam ROM is full in all planes passively. 5/5 compared to the contralateral side signs of impingement with positive Neer and Hawkin's tests, but negative empty can sign. Speeds and Yergason's tests normal. No labral pathology noted with negative Obrien's, negative clunk and good stability. Normal scapular function observed. Painful arc noted but significantly less than previous exam No apprehension sign       Impression and Recommendations:     This case required medical decision making of moderate complexity.

## 2014-10-07 ENCOUNTER — Telehealth: Payer: Self-pay

## 2014-10-07 DIAGNOSIS — M75100 Unspecified rotator cuff tear or rupture of unspecified shoulder, not specified as traumatic: Secondary | ICD-10-CM | POA: Diagnosis not present

## 2014-10-07 DIAGNOSIS — F0390 Unspecified dementia without behavioral disturbance: Secondary | ICD-10-CM

## 2014-10-07 MED ORDER — MEMANTINE HCL ER 28 MG PO CP24
1.0000 | ORAL_CAPSULE | Freq: Every day | ORAL | Status: DC
Start: 2014-10-07 — End: 2017-03-06

## 2014-10-07 MED ORDER — DONEPEZIL HCL 10 MG PO TABS
10.0000 mg | ORAL_TABLET | Freq: Every day | ORAL | Status: DC
Start: 2014-10-07 — End: 2016-04-11

## 2014-10-07 NOTE — Telephone Encounter (Signed)
PA for Namzaric was denied. Is there an alternative.

## 2014-10-07 NOTE — Telephone Encounter (Signed)
changed

## 2014-10-09 ENCOUNTER — Telehealth: Payer: Self-pay | Admitting: Internal Medicine

## 2014-10-09 DIAGNOSIS — F0390 Unspecified dementia without behavioral disturbance: Secondary | ICD-10-CM | POA: Diagnosis not present

## 2014-10-09 DIAGNOSIS — M75100 Unspecified rotator cuff tear or rupture of unspecified shoulder, not specified as traumatic: Secondary | ICD-10-CM | POA: Diagnosis not present

## 2014-10-09 NOTE — Telephone Encounter (Signed)
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  She said that there is noone there to dispense meds to her Regularly.  She is not and cant take her meds the wright way according to her pill box.  She get confused.  Pt said there is a teenage boy that live there but no that is helping her.

## 2014-10-16 DIAGNOSIS — F0390 Unspecified dementia without behavioral disturbance: Secondary | ICD-10-CM | POA: Diagnosis not present

## 2014-10-16 DIAGNOSIS — M75100 Unspecified rotator cuff tear or rupture of unspecified shoulder, not specified as traumatic: Secondary | ICD-10-CM | POA: Diagnosis not present

## 2014-10-23 ENCOUNTER — Telehealth: Payer: Self-pay | Admitting: Internal Medicine

## 2014-10-23 NOTE — Telephone Encounter (Signed)
Rec[facility] forward 4 pages to Dr. Yetta BarreJones

## 2014-11-03 ENCOUNTER — Ambulatory Visit: Payer: Medicare Other | Admitting: Neurology

## 2014-11-03 ENCOUNTER — Telehealth: Payer: Self-pay | Admitting: Neurology

## 2014-11-03 ENCOUNTER — Ambulatory Visit (INDEPENDENT_AMBULATORY_CARE_PROVIDER_SITE_OTHER): Payer: Medicare Other | Admitting: Nurse Practitioner

## 2014-11-03 ENCOUNTER — Encounter: Payer: Self-pay | Admitting: Nurse Practitioner

## 2014-11-03 VITALS — BP 114/63 | HR 70 | Ht 66.0 in | Wt 113.4 lb

## 2014-11-03 DIAGNOSIS — F0280 Dementia in other diseases classified elsewhere without behavioral disturbance: Secondary | ICD-10-CM

## 2014-11-03 DIAGNOSIS — G309 Alzheimer's disease, unspecified: Secondary | ICD-10-CM | POA: Diagnosis not present

## 2014-11-03 NOTE — Patient Instructions (Addendum)
PLAN: Overall, I think you are doing very well!  I would like you to ontinue Aricept 10 mg and Namenda XR 28 mg daily.  Maintain a healthy lifestyle in general and stay active mentally and physically. Please try to eat healthy, exercise daily and keep well hydrated, to keep a scheduled bedtime and wake time routine, to not skip any meals and eat healthy snacks in between meals and to have protein with every meal. I stressed the importance of regular exercise, within of course the patient's own mobility limitations. I encouraged the patient to keep up with current events by reading the newspaper or watching the news and to do word puzzles, or if feasible, to go on [email or web address]. She is encouraged to supplement her meals with protein milkshakes or protein bars. She may want to eat more frequently if she is not eating a big portion at any point in time.   I answered all their questions today and the patient and her son were in agreement with the above outlined plan. Dr. Frances FurbishAthar would like to see the patient back in 4 months, sooner if the need arises and encouraged her to call with any interim questions, concerns, problems, updates and test results.   I hope you have a wonderful holiday season!

## 2014-11-03 NOTE — Telephone Encounter (Signed)
Called patient to reschedule appt for today(Dr Athar not in the office), unable to reach patient and could not leave VM message.

## 2014-11-03 NOTE — Progress Notes (Addendum)
PATIENT: Katelyn Sparks DOB: [birth year]  REASON FOR VISIT: routine follow up for dementia HISTORY FROM: patient  HISTORY OF PRESENT ILLNESS: Katelyn Sparks is a very pleasant 79 year old right-handed woman with an underlying medical history of diabetes, reflux disease, and left shoulder pain, who presents for followup consultation of her memory loss, probable Alzheimer's disease.   Today Katelyn Sparks comes to the office alone. She drove herself to the office. She did not bring her medications and cannot verify which ones she is taking. Katelyn Sparks started her on Bellsomra to help her sleep but she reports that this is not helping. Her main concern today is constipation. Of note, she has lost 10 lbs.since her last visit.  She states that her appetite is good. She will be traveling with her [place]. She feels that her memory is stable, she states she has good days and bad days.   Last visit, 05/28/14: The patient is accompanied by her son, Katelyn Sparks, again today and I also talked to her other son Katelyn Sparks?) on the phone. I last saw her on 01/13/14, at which time I started her on Aricept 5 mg. In the interim, in may of the sure she was seen by her primary care physician who started her on Namenda long-acting. She is supposed to be on 28 mg once daily but her son is not completely sure if she is taking it. The patient is not able to tell and her other son did not now either.   Today, her son reports that she is doing about the same. She has perhaps stabilized. She has been able to tolerate Aricept 5 mg once daily. The patient herself has no new complaints. She resides at home with her 26 year old grandson, Katelyn Sparks. She still drives some. Her son let her drive to the appointment today and states that she did okay. Most of the time, especially when she has to go to an appointment her son will take her. In the interim, on 04/03/2014 she was seen by Katelyn Sparks for neurocognitive testing. His  conclusions were mild dementia without behavioral complications, she lacks insight into her cognitive impairment, etiology could be primary cortical dementia or perhaps a mixed cortical-vascular dementia. He recommended line of site supervision while cooking given the safety risk associated with memory impairment, bill paying should be converted to automatic pink drafting where possible. Her family was advised to regularly monitor her driving competency. He recommended this finding someone to have power of attorney status over her health care decision making and finances.   I first met her on 09/13/2013 at which time she was accompanied by her friend Katelyn Sparks. The patient reported a history of memory problems for about one year including forgetfulness, misplacing things, easy frustration. I suggested further workup in the form of neuropsychological testing as well as MRI brain. She had a brain MRI without contrast on 01/09/2014: Abnormal MRI brain (without) demonstrating: 1. Moderate periventricular and subcortical chronic small vessel ischemic disease. 2. Punctate left parietal convexity chronic cerebral microhemorrhage. In addition, personally reviewed the images through the PACS system and explained the findings to the patient and her son.   Regarding her cognitive testing, received a message from Katelyn Sparks from Katelyn Sparks office on 12/10/13: "Ms. Beretta did not show for her appointment on 12/09/13 for neuropsychological evaluation. When contacted, she seemed confused and claimed no recollection of having made the appointment. She agreed to come in later the same morning  but again did not show. Called her. She stated she went to the wrong place. She re-scheduled for 12/16/13. She called on 12/10/13 to cancel that appointment and did not wish to re-schedule."   She lives in her own home, is divorced and her 76 yo GS lives with her. This causes her stress at times. She drives, and has not gotten lost  driving. She has no VH or AH, no SI/HI, no paranoid delusions, no Hx of inappropriate behavior. Her father had dementia and died at the age of 36. He mother lived to be 80 and had no dementia. She had a total of 13 siblings, 3 died in childhood. No siblings with memory loss.   The patient denies prior TIA or stroke symptoms, such as sudden onset of one sided weakness, numbness, tingling, slurring of speech or droopy face, hearing loss, tinnitus, diplopia or visual field cut or monocular loss of vision, and denies recurrent headaches. No falls, no head injury, no snoring was reported. 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   Blood work from 08/28/13: N CBC, N CMP and HbA1c of 6.6 and elevated cholesterol at 262 and LDL of 172.    REVIEW OF SYSTEMS: Full 14 system review of systems performed and notable only for: constipation, insomnia, memory loss.   ALLERGIES: No Known Allergies  HOME MEDICATIONS: Outpatient Prescriptions Prior to Visit  Medication Sig Dispense Refill  . atorvastatin (LIPITOR) 80 MG tablet Take 1 tablet (80 mg total) by mouth daily. 90 tablet 3  . donepezil (ARICEPT) 10 MG tablet Take 1 tablet (10 mg total) by mouth at bedtime. 90 tablet 3  . Memantine HCl ER (NAMENDA XR) 28 MG CP24 Take 28 mg by mouth daily. 90 capsule 3  . omeprazole (PRILOSEC) 40 MG capsule Take 1 capsule (40 mg total) by mouth daily. 90 capsule 3  . Suvorexant (BELSOMRA) 10 MG TABS Take 1 tablet by mouth daily. 30 tablet 5  . ondansetron (ZOFRAN-ODT) 4 MG disintegrating tablet TAKE 1 TABLET BY MOUTH EVERY 8 HOURS AS NEEDED FOR NAUSEA 50 tablet 0   No facility-administered medications prior to visit.    PHYSICAL EXAM Filed Vitals:   11/03/14 1021  BP: 114/63  Pulse: 70  Height: _0  (1.676 m)  Weight: 113 lb 6 oz (51.427 kg)   Body mass index is 18.31 kg/(m^2).  MMSE - Mini Mental State Exam 11/03/2014  Orientation to time 4  Orientation to Place 5  Registration 3  Attention/  Calculation 4  Recall 0  Language- name 2 objects 2  Language- repeat 0  Language- follow 3 step command 3  Language- read & follow direction 0  Write a sentence 0  Copy design 1  Total score 22   Generalized: Well developed, in no acute distress  Head: normocephalic and atraumatic. Oropharynx benign  Neck: Supple, no carotid bruits  Cardiac: Regular rate rhythm, no murmur  Musculoskeletal: No deformity   Neurological examination  Mentation: Mental status: The patient is awake and alert, paying good attention. She is able to partially provide the history. Her friend provides some details. She is oriented to: person, place, situation, day of week and year. Her memory, attention, language and knowledge are impaired. There is no aphasia, agnosia, apraxia or anomia. There is a mild degree of bradyphrenia. Speech is mildly hypophonic with no dysarthria noted. Mood is congruent and affect is normal.   On 09/13/13: Her MMSE (Mini-Mental state exam) score was 21/30,  CDT (Clock Drawing Test) score was 4/4 and AFT (Animal Fluency Test) score was 10. On 05/28/14: Her MMSE: 20/30, CDT: 4/4, AFT: 9  On 11/03/14 : Her MMSE 22/30, CDT: 4/4, AFT: 14 Cranial nerve II-XII: Normocephalic, atraumatic, pupils are equal, round and reactive to light and accommodation. Funduscopic exam is normal with sharp disc margins noted. Extraocular tracking shows mild saccadic breakdown without nystagmus noted. Hearing is impaired mildly. Face is symmetric with no facial masking and normal facial sensation. There is no lip, neck or jaw tremor. Neck is mildly rigid with intact passive ROM. There are no carotid bruits on auscultation. Oropharynx exam reveals mild mouth dryness. Moderate airway crowding is noted. Mallampati is class III. Tongue protrudes centrally and palate elevates symmetrically.   Motor: The motor testing reveals 5 over 5 strength of all 4 extremities. Good symmetric motor tone is noted throughout.  Sensory:  Sensory testing is intact to soft touch on all 4 extremities. No evidence of extinction is noted.  Coordination: Cerebellar testing reveals good finger-nose-finger and heel-to-shin bilaterally.  Gait and station: She stands up from the seated position with no difficulty and needs no assistance. No veering to one side is noted. No leaning to one side. Posture is mildly stooped, age-appropriate. Stance is narrow-based. She turns en bloc. Tandem walk is not easily possible. Balance is mildly impaired.  Reflexes: Deep tendon reflexes are symmetric and 1+ bilaterally.   DIAGNOSTIC DATA (LABS, IMAGING, TESTING) - I reviewed patient records, labs, notes, testing and imaging myself where available.  Lab Results  Component Value Date   WBC 4.1 08/20/2014   HGB 13.9 08/20/2014   HCT 40.7 08/20/2014   MCV 86.8 08/20/2014   PLT 250.0 08/20/2014      Component Value Date/Time   NA 140 08/20/2014 1038   K 3.9 08/20/2014 1038   CL 104 08/20/2014 1038   CO2 30 08/20/2014 1038   GLUCOSE 91 08/20/2014 1038   BUN 11 08/20/2014 1038   CREATININE 0.8 08/20/2014 1038   CALCIUM 9.7 08/20/2014 1038   PROT 7.8 08/20/2014 1038   ALBUMIN 4.1 08/20/2014 1038   AST 18 08/20/2014 1038   ALT 18 08/20/2014 1038   ALKPHOS 80 08/20/2014 1038   BILITOT 0.6 08/20/2014 1038   GFRNONAA 79* 08/21/2013 0250   GFRAA >90 08/21/2013 0250   Lab Results  Component Value Date   CHOL 289* 08/20/2014   HDL 88.60 08/20/2014   LDLCALC 185* 08/20/2014   LDLDIRECT 172.6 08/28/2013   TRIG 77.0 08/20/2014   CHOLHDL 3 08/20/2014   Lab Results  Component Value Date   HGBA1C 6.1 08/20/2014   Lab Results  Component Value Date   TSH 1.84 08/20/2014    ASSESSMENT: In summary, Katelyn Sparks is a very pleasant 79 year-old female with an underlying medical history of HLP, reflux disease, and left shoulder pain, who has an over 1 year history of memory loss. Her history and physical exam are keeping with memory loss likely  beyond MCI, possibly early dementia of the Alzheimer's type vs mixed dementia, without evidence of behavioral disturbance. She has moderate WM disease on her brain MRI as well as chronic microhemorrhage on the L. We will do a CTH down the road to monitor. She had a mild increase in ammonia, and upon recheck it was fine. She had neuropsychological evaluation in April of this year which was in keeping with mild dementia.  I had a long chat with the patient and her son(s) about my  findings and the diagnosis of memory loss and dementia, its prognosis and treatment options. Katelyn Sparks lives locally, and her other son is in [place], [place]. I asked that Katelyn Sparks bring a family member with her at next visit. She was unaccompanied today and drove herself to the office. She did not bring her medications with her and she is unable to verify which medications she is taking.   PLAN: Continue Aricept 10 mg and Namenda XR 28 mg daily. (Unsure if she is taking these).  We talked maintaining a healthy lifestyle in general and staying active mentally and physically. I encouraged the patient to eat healthy, exercise daily and keep well hydrated, to keep a scheduled bedtime and wake time routine, to not skip any meals and eat healthy snacks in between meals and to have protein with every meal. I stressed the importance of regular exercise, within of course the patient's own mobility limitations. I encouraged the patient to keep up with current events by reading the newspaper or watching the news and to do word puzzles, or if feasible, to go on [email or web address]. She is encouraged to supplement her meals with protein milkshakes or protein bars. She may want to eat more frequently if she is not eating a big portion at any point in time.   I answered all their questions today and the patient and her son were in agreement with the above outlined plan. Dr. Rexene Alberts would like to see the patient back in 4 months, sooner if the need arises and  encouraged her to call with any interim questions, concerns, problems, updates and test results.   Katelyn Rummage Eliab Closson, MSN, FNP-BC, A/GNP-C 11/03/2014, 10:57 AM [facility] [address] [place],  [address] [phone]  Note: This document was prepared with digital dictation and possible smart phrase technology. Any transcriptional errors that result from this process are unintentional.  I reviewed the above note and documentation by the Nurse Practitioner and agree with the history, physical exam, assessment and plan as outlined above. Star Age, MD, PhD [facility])

## 2015-03-02 ENCOUNTER — Encounter: Payer: Self-pay | Admitting: Neurology

## 2015-03-02 ENCOUNTER — Ambulatory Visit (INDEPENDENT_AMBULATORY_CARE_PROVIDER_SITE_OTHER): Payer: Medicare Other | Admitting: Neurology

## 2015-03-02 VITALS — BP 118/74 | HR 93 | Temp 98.2°F | Resp 16 | Ht 66.0 in | Wt 123.0 lb

## 2015-03-02 DIAGNOSIS — R202 Paresthesia of skin: Secondary | ICD-10-CM | POA: Diagnosis not present

## 2015-03-02 DIAGNOSIS — F0280 Dementia in other diseases classified elsewhere without behavioral disturbance: Secondary | ICD-10-CM

## 2015-03-02 DIAGNOSIS — G4700 Insomnia, unspecified: Secondary | ICD-10-CM | POA: Diagnosis not present

## 2015-03-02 DIAGNOSIS — G309 Alzheimer's disease, unspecified: Secondary | ICD-10-CM | POA: Diagnosis not present

## 2015-03-02 NOTE — Progress Notes (Signed)
Subjective:    Patient ID: Katelyn Sparks is a 80 y.o. female.  HPI    Interim history:   Katelyn Sparks is a very pleasant 80 year old right-handed woman with an underlying medical history of diabetes, reflux disease, and left shoulder pain, who presents for followup consultation of her memory loss. The patient is unaccompanied today and drove herself to the appointment. I last saw her on 05/28/2014, at which time her son felt that she had stabilized. She was on Aricept 5 mg. I increased this to 10 mg. She was supposed be on Namenda long-acting 28 mg once daily. She still lived with her 40 year old grandson. She was driving some. In the interim, she was seen by our nurse practitioner, [name], on 11/03/2014, at which time her MMSE was 22. She was complaining of constipation. She had been prescribed Belsomra for insomnia but her primary care physician. She was advised to continue with Aricept at 10 mg and Namenda long-acting at 28 mg once daily.   Today, she tells me, she goes to the gym once or 2 times a week. She is not sure about her memory medications, we will call her pharmacy to confirm. She tries to stay active. She drinks about 4 glasses of water per day. Appetite is okay. Her GS still stays with her. He is graduating from high school this year. She has had no falls. She feels, her memory is stable. She has occasional tingling in the right arm, it comes and goes, feels, like something crawling, no weakness, no pain, numbness, lasts for minutes.   Previously:  I saw her on 01/13/14, at which time I started her on Aricept 5 mg. In the interim, her PCP started her on Namenda long-acting. She was supposed to be on 28 mg once daily but her son was not completely sure if she was on it. The patient is not able to tell and her other son did not now either.    I first met her on 09/13/2013 at which time she was accompanied by her friend [name]. The patient reported a history of memory problems for  about one year including forgetfulness, misplacing things, easy frustration. I suggested further workup in the form of neuropsychological testing as well as MRI brain. She had a brain MRI without contrast on 01/09/2014: Abnormal MRI brain (without) demonstrating: 1. Moderate periventricular and subcortical chronic small vessel ischemic disease. 2. Punctate left parietal convexity chronic cerebral microhemorrhage. In addition, personally reviewed the images through the PACS system and explained the findings to the patient and her son.   Regarding her cognitive testing, received a message from [name] from Dr. Monico Hoar office on 12/10/13: "[name] dis not show for her appointment on 12/09/13 for neuropsychological evaluation. When contacted, she seemed confused and claimed no recollection of having made the appointment. She agreed to come in later the same morning but again did not show. Called her. She stated she went to the wrong place. She re-scheduled for 12/16/13. She called on 12/10/13 to cancel that appointment and did not wish to re-schedule."   She lives in her own home, is divorced and her 78 yo GS lives with her. This causes her stress at times. She drives, and has not gotten lost driving. She has no VH or AH, no SI/HI, no paranoid delusions, no Hx of inappropriate behavior. Her father had dementia and died at the age of 10. He mother lived to be 64 and had no dementia. She had a  total of 13 siblings, 3 died in childhood. No siblings with memory loss.   The patient denies prior TIA or stroke symptoms, such as sudden onset of one sided weakness, numbness, tingling, slurring of speech or droopy face, hearing loss, tinnitus, diplopia or visual field cut or monocular loss of vision, and denies recurrent headaches. No falls, no head injury, no snoring was reported. 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   Blood work from 08/28/13: N CBC, N CMP and HbA1c of 6.6 and elevated cholesterol  at 262 and LDL of 172.    Her Past Medical History Is Significant For: Past Medical History  Diagnosis Date  . Osteoarthrosis, unspecified whether generalized or localized, unspecified site   . Unspecified hemorrhoids without mention of complication   . Flatulence, eructation, and gas pain   . Esophageal reflux   . Blood in stool   . Dementia     Her Past Surgical History Is Significant For: Past Surgical History  Procedure Laterality Date  . Abdominal hysterectomy    . Colonoscopy  11/2010  . Upper gi endoscopy  11/2010    Her Family History Is Significant For: Family History  Problem Relation Age of Onset  . Diabetes    . Hypertension Mother   . Colon cancer Neg Hx   . Cancer Neg Hx   . Early death Neg Hx   . Heart disease Neg Hx   . Hyperlipidemia Neg Hx   . Kidney disease Neg Hx   . Learning disabilities Neg Hx   . Stroke Neg Hx   . Diabetes Mother   . Diabetes Sister   . Diabetes Brother     Her Social History Is Significant For: History   Social History  . Marital Status: Widowed    Spouse Name: N/A  . Number of Children: 2  . Years of Education: 12   Occupational History  . Retired    Social History Main Topics  . Smoking status: Never Smoker   . Smokeless tobacco: Never Used  . Alcohol Use: No  . Drug Use: No  . Sexual Activity: Not Currently   Other Topics Concern  . None   Social History Narrative   She is a retired from a [employer].   Her husband passed away in 01-30-2000.  She has two son.   Patient is right handed.   Her grandson lives with her.   Patient drinks 1 cup of caffeine daily    Her Allergies Are:  No Known Allergies:   Her Current Medications Are:  Outpatient Encounter Prescriptions as of 03/02/2015  Medication Sig  . atorvastatin (LIPITOR) 80 MG tablet Take 1 tablet (80 mg total) by mouth daily.  Marland Kitchen donepezil (ARICEPT) 10 MG tablet Take 1 tablet (10 mg total) by mouth at bedtime.  . Memantine HCl ER (NAMENDA XR) 28 MG  CP24 Take 28 mg by mouth daily.  Marland Kitchen omeprazole (PRILOSEC) 40 MG capsule Take 1 capsule (40 mg total) by mouth daily.  . Suvorexant (BELSOMRA) 10 MG TABS Take 1 tablet by mouth daily.  :  Review of Systems:  Out of a complete 14 point review of systems, all are reviewed and negative with the exception of these symptoms as listed below:   Review of Systems  Neurological:       Trouble falling asleep, states that she cannot fall asleep    Objective:  Neurologic Exam  Physical Exam Physical Examination:   Danley Danker  Vitals:   03/02/15 1253  BP: 118/74  Pulse: 93  Temp: 98.2 F (36.8 C)  Resp: 16   General Examination: The patient is a very pleasant 80 y.o. female in no acute distress. She is calm and cooperative with the exam. She denies Auditory Hallucinations and Visual Hallucinations. She is well groomed and situated in a chair.   HEENT: Normocephalic, atraumatic, pupils are equal, round and reactive to light and accommodation. Funduscopic exam is normal with sharp disc margins noted. Extraocular tracking shows mild saccadic breakdown without nystagmus noted. Hearing is impaired mildly. Face is symmetric with no facial masking and normal facial sensation. There is no lip, neck or jaw tremor. Neck is mildly rigid with intact passive ROM. There are no carotid bruits on auscultation. Oropharynx exam reveals mild mouth dryness. Moderate airway crowding is noted. Mallampati is class III. Tongue protrudes centrally and palate elevates symmetrically.    Chest: is clear to auscultation without wheezing, rhonchi or crackles noted.  Heart: sounds are regular and normal without murmurs, rubs or gallops noted.   Abdomen: is soft, non-tender and non-distended with normal bowel sounds appreciated on auscultation.  Extremities: There is no pitting edema in the distal lower extremities bilaterally. Pedal pulses are intact.   Skin: is warm and dry with no trophic changes noted. Age-related changes are  noted on the skin.   Musculoskeletal: exam reveals no obvious joint deformities, tenderness or joint swelling or erythema.   Neurologically:  Mental status: The patient is awake and alert, paying good  attention. She is able to partially provide some of her history. She is oriented to: person, place, situation, day of week and year. Her memory, attention, language and knowledge are impaired. There is no aphasia, agnosia, apraxia or anomia. There is a mild degree of bradyphrenia. Speech is mildly hypophonic with no dysarthria noted. Mood is congruent and affect is normal.   On 09/13/13: Her MMSE (Mini-Mental state exam) score was 21/30, CDT (Clock Drawing Test) score was 4/4 and AFT (Animal Fluency Test) score was 10/min.  On 05/28/14: Her MMSE: 20/30, CDT: 4/4, AFT: 9/min.  On 03/02/2015: MMSE: 22/30, CDT: 3/4, AFT: 11/min, GDS: 0/15.   Cranial nerves are as described above under HEENT exam. In addition, shoulder shrug is normal with equal shoulder height noted.  Motor exam: Normal bulk, and strength for age is noted. Tone is not rigid with absence of cogwheeling in the extremities. There is overall no significant bradykinesia. There is no drift or rebound. There is no tremor.   Romberg is negative. Reflexes are 1+ in the upper extremities and 1+ in the lower extremities. Toes are downgoing bilaterally. Fine motor skills: Finger taps, hand movements, and rapid alternating patting are not impaired bilaterally. Foot taps and foot agility are not impaired bilaterally.   Cerebellar testing shows no dysmetria or intention tremor on finger to nose testing. There is no truncal or gait ataxia.   Sensory exam is intact to light touch, pinprick, vibration, temperature sense in the upper and lower extremities.   Gait, station and balance: She stands up from the seated position with no difficulty and needs no assistance. No veering to one side is noted. No leaning to one side. Posture is mildly stooped,  age-appropriate. Stance is narrow-based. She turns en bloc. Tandem walk is not possible for her and balance is mildly impaired.   Assessment and Plan:   In summary, SHARAYAH RENFROW is a very pleasant 80 year-old female with an underlying medical history  of HLP, reflux disease, and left shoulder pain, who presents for follow-up consultation of her 1-1/2 year history of memory loss. Her history and physical exam are keeping with memory loss likely beyond MCI, possibly early dementia of the Alzheimer's type vs mixed dementia, without evidence of behavioral disturbance. She had moderate WM disease on her brain MRI as well as chronic microhemorrhage on the L. We can repeat an MRI down the Road in order to monitor her white matter changes. However, at this juncture, she is stable physically and her memory scores have been stable. She had a mild increase in ammonia, and upon recheck it was fine. She had neuropsychological evaluation in April 2015, with results in keeping with mild dementia. She has been able to tolerate Aricept and has been on Namenda long-acting 28 mg once daily. I have asked her  to continue with generic Aricept 10 mg once daily and long-acting Namenda 28 mg once daily. It looks like her primary care physician is prescribing her medicines and she did not need a refill at this time. We will confirm with her pharmacy that she is on both medications. I asked her to continue to stay active mentally and physically. She is advised to exercise daily if possible. She is encouraged to drink about 6 glasses of water per day to stay better hydrated. For her sleep she has been prescribed Belsomra right by her primary care physician. She has intermittent paresthesias of her right arm but these last only for a few minutes at a time. She has no accompanying weakness or numbness. We will monitor these symptoms. We will also consider repeat neuropsychological testing later this year. I would like to see her back in 6  months, sooner if the need arises. I answered all her questions today and she was in agreement. I spent 25 minutes in total face-to-face time with the patient, more than 50% of which was spent in counseling and coordination of care, reviewing test results, reviewing medication and discussing or reviewing the diagnosis of dementia, its prognosis and treatment options.

## 2015-03-02 NOTE — Patient Instructions (Signed)
We will continue with your memory medications.  Follow up in 6 months.  Your exam is stable.  Try to drink about 6 glasses of water per day.  Try to exercise daily.

## 2015-03-04 ENCOUNTER — Telehealth: Payer: Self-pay

## 2015-03-04 NOTE — Telephone Encounter (Signed)
Left message for Cedric to call back. Stated that we would like to talk about the medications Pattricia Bossnnie is taking, left call back number.

## 2015-03-10 NOTE — Telephone Encounter (Signed)
I was finally able to get a hold of the son. He is aware of our concerns that Katelyn Sparks is not taking her medication as prescribed. He also made note of next appt and will call if he has any questions.

## 2015-03-16 ENCOUNTER — Telehealth: Payer: Self-pay | Admitting: Neurology

## 2015-03-16 NOTE — Telephone Encounter (Signed)
I don't recall talking to her about nausea. She did not report any significant nausea at the time and I certainly do not remember talking about nausea medication. Nausea can be a sign of medication side effect or flareup of reflux and a host of other underlying issues. It is probably best for her to get seen by her primary care physician first.

## 2015-03-16 NOTE — Telephone Encounter (Signed)
The patient needs something because what she has been on before is not working and wants to be on something different and she went up to her pharmacy the other day and asked if anything has been called in for her yet and they said no. And she isn't sure if Dr. Frances FurbishAthar just didn't send anything in bc she thought she was going to send something in. But she said she really needs something to help her out because she feels sick's and she'll take some tums to try to help with the feeling but it will come back the next day.  The best number to contact her is [phone]

## 2015-03-16 NOTE — Telephone Encounter (Signed)
I spoke with Katelyn Sparks, she says that at her last office visit she thought that you were going to call in something for nausea. I reported that I do not see anything in her last note. Katelyn Sparks reports bouts of nausea throughout the day and thinks it is because of her medications. (I did speak with sone last week and he is aware of our concerns that she is not taking her medications correctly. He stated that he would look into it. ) Do have any suggestions for her, or should she see PCP?

## 2015-03-17 NOTE — Telephone Encounter (Signed)
Left message for Katelyn Sparks with below information, and recommended that she call her PCP to treat nausea

## 2015-04-07 ENCOUNTER — Telehealth: Payer: Self-pay | Admitting: Neurology

## 2015-04-07 NOTE — Telephone Encounter (Signed)
Left message for [name] to call back. We can reschedule appt for a time that her son can be with her.

## 2015-04-07 NOTE — Telephone Encounter (Signed)
[name], pt's son's girlfriend called wanting to see if the patient can be seen sooner then her set 6 months f/u appt for 09/02/15. Pt''s son will be in town And would like to speak with the Dr. Frances FurbishAthar regarding pt's current condition. Please call and advice. C/b [phone]

## 2015-04-13 ENCOUNTER — Encounter: Payer: Self-pay | Admitting: Internal Medicine

## 2015-04-13 ENCOUNTER — Other Ambulatory Visit (INDEPENDENT_AMBULATORY_CARE_PROVIDER_SITE_OTHER): Payer: Medicare Other

## 2015-04-13 ENCOUNTER — Other Ambulatory Visit: Payer: Self-pay | Admitting: Internal Medicine

## 2015-04-13 ENCOUNTER — Ambulatory Visit (INDEPENDENT_AMBULATORY_CARE_PROVIDER_SITE_OTHER): Payer: Medicare Other | Admitting: Internal Medicine

## 2015-04-13 VITALS — BP 130/70 | HR 65 | Temp 97.8°F | Resp 16 | Ht 66.0 in | Wt 128.2 lb

## 2015-04-13 DIAGNOSIS — F0390 Unspecified dementia without behavioral disturbance: Secondary | ICD-10-CM | POA: Diagnosis not present

## 2015-04-13 DIAGNOSIS — R739 Hyperglycemia, unspecified: Secondary | ICD-10-CM

## 2015-04-13 DIAGNOSIS — E785 Hyperlipidemia, unspecified: Secondary | ICD-10-CM | POA: Diagnosis not present

## 2015-04-13 DIAGNOSIS — F068 Other specified mental disorders due to known physiological condition: Secondary | ICD-10-CM | POA: Diagnosis not present

## 2015-04-13 DIAGNOSIS — T466X6A Underdosing of antihyperlipidemic and antiarteriosclerotic drugs, initial encounter: Secondary | ICD-10-CM | POA: Diagnosis not present

## 2015-04-13 DIAGNOSIS — Z23 Encounter for immunization: Secondary | ICD-10-CM | POA: Diagnosis not present

## 2015-04-13 DIAGNOSIS — Z0000 Encounter for general adult medical examination without abnormal findings: Secondary | ICD-10-CM | POA: Diagnosis not present

## 2015-04-13 LAB — COMPREHENSIVE METABOLIC PANEL
ALBUMIN: 4.2 g/dL (ref 3.5–5.2)
ALK PHOS: 80 U/L (ref 39–117)
ALT: 12 U/L (ref 0–35)
AST: 16 U/L (ref 0–37)
BILIRUBIN TOTAL: 0.5 mg/dL (ref 0.2–1.2)
BUN: 18 mg/dL (ref 6–23)
CALCIUM: 10 mg/dL (ref 8.4–10.5)
CHLORIDE: 101 meq/L (ref 96–112)
CO2: 32 mEq/L (ref 19–32)
Creatinine, Ser: 0.88 mg/dL (ref 0.40–1.20)
GFR: 79.92 mL/min (ref >60.00)
Glucose, Bld: 100 mg/dL — ABNORMAL HIGH (ref 70–99)
Potassium: 4.4 mEq/L (ref 3.5–5.1)
Sodium: 139 mEq/L (ref 135–145)
TOTAL PROTEIN: 8.1 g/dL (ref 6.0–8.3)

## 2015-04-13 LAB — LIPID PANEL
CHOL/HDL RATIO: 5
Cholesterol: 339 mg/dL — ABNORMAL HIGH (ref 0–200)
HDL: 75.3 mg/dL (ref >39.00)
LDL CALC: 239 mg/dL — AB (ref 0–99)
NonHDL: 263.7
Triglycerides: 122 mg/dL (ref 0.0–149.0)
VLDL: 24.4 mg/dL (ref 0.0–40.0)

## 2015-04-13 NOTE — Assessment & Plan Note (Signed)
Will check her FLP today to see if she has been taking the statin

## 2015-04-13 NOTE — Assessment & Plan Note (Signed)

## 2015-04-13 NOTE — Addendum Note (Signed)
Addended by: Etta GrandchildJONES, Vance Hochmuth L on: 04/13/2015 12:48 PM   Modules accepted: Kipp BroodSmartSet

## 2015-04-13 NOTE — Assessment & Plan Note (Addendum)
Will ask [facility] to see her again to see if they can help Minimal improvement note thus far

## 2015-04-13 NOTE — Patient Instructions (Signed)

## 2015-04-13 NOTE — Progress Notes (Signed)
   Subjective:    Patient ID: Katelyn Sparks, female    DOB: 09/30/1935, 80 y.o.   MRN: [id number]  HPI Comments: She returns with her son today - he is concerned that she is forgetting to take her meds, she lives with a teenage grandson who is described as not being helpful - she does most things independently. He wants to know if [facility] can help with med compliance.  Hyperlipidemia This is a chronic problem. The current episode started more than 1 year ago. Recent lipid tests were reviewed and are variable. She has no history of chronic renal disease, diabetes, hypothyroidism, liver disease, obesity or nephrotic syndrome. There are no known factors aggravating her hyperlipidemia. Pertinent negatives include no chest pain, focal sensory loss, focal weakness, leg pain, myalgias or shortness of breath. Current antihyperlipidemic treatment includes statins. Compliance problems include psychosocial issues.       Review of Systems  Constitutional: Negative.  Negative for fever, chills, diaphoresis, appetite change and fatigue.  HENT: Negative.   Eyes: Negative.   Respiratory: Negative.  Negative for cough, choking, chest tightness, shortness of breath and stridor.   Cardiovascular: Negative.  Negative for chest pain, palpitations and leg swelling.  Gastrointestinal: Negative.  Negative for nausea, vomiting, abdominal pain, diarrhea, constipation and blood in stool.  Endocrine: Negative.   Genitourinary: Negative.   Musculoskeletal: Negative.  Negative for myalgias and back pain.  Skin: Negative.  Negative for rash.  Allergic/Immunologic: Negative.   Neurological: Negative.  Negative for dizziness and focal weakness.  Hematological: Negative.  Negative for adenopathy. Does not bruise/bleed easily.  Psychiatric/Behavioral: Positive for confusion and decreased concentration. Negative for sleep disturbance, self-injury, dysphoric mood and agitation. The patient is not nervous/anxious and is not  hyperactive.        Objective:   Physical Exam   Lab Results  Component Value Date   WBC 4.1 08/20/2014   HGB 13.9 08/20/2014   HCT 40.7 08/20/2014   PLT 250.0 08/20/2014   GLUCOSE 91 08/20/2014   CHOL 289* 08/20/2014   TRIG 77.0 08/20/2014   HDL 88.60 08/20/2014   LDLDIRECT 172.6 08/28/2013   LDLCALC 185* 08/20/2014   ALT 18 08/20/2014   AST 18 08/20/2014   NA 140 08/20/2014   K 3.9 08/20/2014   CL 104 08/20/2014   CREATININE 0.8 08/20/2014   BUN 11 08/20/2014   CO2 30 08/20/2014   TSH 1.84 08/20/2014   HGBA1C 6.1 08/20/2014       Assessment & Plan:

## 2015-04-14 ENCOUNTER — Telehealth: Payer: Self-pay | Admitting: Internal Medicine

## 2015-04-14 DIAGNOSIS — G4701 Insomnia due to medical condition: Secondary | ICD-10-CM

## 2015-04-14 MED ORDER — TRAZODONE HCL 50 MG PO TABS: 50.0000 mg | ORAL_TABLET | Freq: Every evening | ORAL | Status: DC | PRN

## 2015-04-14 NOTE — Telephone Encounter (Signed)
Change belsomra to trazodone

## 2015-04-14 NOTE — Telephone Encounter (Signed)
Patient will not be taking BELSOMRA 10 MG TABS [[id number] because it doesn't work, but will something else for sleep. melatonin 5mg  does not work either.  She is also taking fish oil 1200 mg which is not prescribed. Please call Selena BattenKim

## 2015-04-14 NOTE — Telephone Encounter (Signed)
Patient was just seen 04/13/15 and vaccines updated.

## 2015-04-14 NOTE — Telephone Encounter (Signed)
Pt came by to make sure she is up to date on all of her vaccinations.

## 2015-04-14 NOTE — Telephone Encounter (Signed)
Notified Kim with md response...Raechel Chute/lmb

## 2015-04-17 DIAGNOSIS — F0390 Unspecified dementia without behavioral disturbance: Secondary | ICD-10-CM | POA: Diagnosis not present

## 2015-04-17 DIAGNOSIS — T466X6A Underdosing of antihyperlipidemic and antiarteriosclerotic drugs, initial encounter: Secondary | ICD-10-CM | POA: Diagnosis not present

## 2015-04-20 DIAGNOSIS — F0390 Unspecified dementia without behavioral disturbance: Secondary | ICD-10-CM | POA: Diagnosis not present

## 2015-04-20 DIAGNOSIS — T466X6A Underdosing of antihyperlipidemic and antiarteriosclerotic drugs, initial encounter: Secondary | ICD-10-CM | POA: Diagnosis not present

## 2015-04-20 NOTE — Telephone Encounter (Signed)
Looking at schedule, appt has been made.

## 2015-04-21 ENCOUNTER — Telehealth: Payer: Self-pay

## 2015-04-21 NOTE — Telephone Encounter (Signed)
Katelyn Sparks with [facility] called needing to verify if there is a Power of Attorney listed on behalf of patient. She states that patient is demented and still has an active drivers license as well no consistent care provider other that a 78 yr old in the home. They are going to report the case to Adult protective agency and would like to also know if [facility] should not be driving.

## 2015-04-22 DIAGNOSIS — T466X6A Underdosing of antihyperlipidemic and antiarteriosclerotic drugs, initial encounter: Secondary | ICD-10-CM | POA: Diagnosis not present

## 2015-04-22 DIAGNOSIS — F0390 Unspecified dementia without behavioral disturbance: Secondary | ICD-10-CM | POA: Diagnosis not present

## 2015-04-24 DIAGNOSIS — T466X6A Underdosing of antihyperlipidemic and antiarteriosclerotic drugs, initial encounter: Secondary | ICD-10-CM | POA: Diagnosis not present

## 2015-04-24 DIAGNOSIS — F0390 Unspecified dementia without behavioral disturbance: Secondary | ICD-10-CM | POA: Diagnosis not present

## 2015-04-26 NOTE — Telephone Encounter (Signed)
If this is the case then the family needs to make it such that she does not have access to a car or car keys

## 2015-05-18 ENCOUNTER — Ambulatory Visit (INDEPENDENT_AMBULATORY_CARE_PROVIDER_SITE_OTHER): Payer: Medicare Other | Admitting: Neurology

## 2015-05-18 ENCOUNTER — Encounter: Payer: Self-pay | Admitting: Neurology

## 2015-05-18 VITALS — BP 118/62 | HR 72 | Resp 14 | Ht 66.0 in | Wt 125.0 lb

## 2015-05-18 DIAGNOSIS — G309 Alzheimer's disease, unspecified: Secondary | ICD-10-CM

## 2015-05-18 DIAGNOSIS — F0280 Dementia in other diseases classified elsewhere without behavioral disturbance: Secondary | ICD-10-CM

## 2015-05-18 DIAGNOSIS — G4700 Insomnia, unspecified: Secondary | ICD-10-CM | POA: Diagnosis not present

## 2015-05-18 DIAGNOSIS — K5909 Other constipation: Secondary | ICD-10-CM

## 2015-05-18 DIAGNOSIS — K5900 Constipation, unspecified: Secondary | ICD-10-CM

## 2015-05-18 NOTE — Progress Notes (Signed)
Subjective:    Patient ID: Katelyn Sparks is a 80 y.o. female.  HPI    Interim history:   Katelyn Sparks is a very pleasant 80 year old right-handed woman with an underlying medical history of diabetes, reflux disease, and left shoulder pain, who presents for followup consultation of her memory loss. The patient is accompanied by her son, Katelyn Sparks, today (he is visiting from [place]). I last saw her on 03/02/2015, at which time she reported going to the gym once or twice a week. She was not completely sure about her memory, medications. She was unaccompanied at the time. She was drinking about 4 glasses of water per day and appetite was okay. Her grandson was living with her. She reported no recent falls and felt her memory is stable. Her MMSE was 22 at the time, and she was advised to continue with Aricept 10 mg and Namenda long-acting 28 mg once daily.  Today, 05/18/2015: She reports ongoing issues with insomnia. She has constipation. Trazodone she feels has not helped for her insomnia. He is wondering if she has side effects from the medication for memory. She tries to drink enough water. Appetite is good. She still drives, locally only.  Previously:   I saw her on 05/28/2014, at which time her son felt that she had stabilized. She was on Aricept 5 mg. I increased this to 10 mg. She was supposed be on Namenda long-acting 28 mg once daily. She still lived with her 80 year old grandson. She was driving some. In the interim, she was seen by our nurse practitioner, Katelyn Sparks, on 11/03/2014, at which time her MMSE was 22. She was complaining of constipation. She had been prescribed Belsomra for insomnia but her primary care physician. She was advised to continue with Aricept at 10 mg and Namenda long-acting at 28 mg once daily.   I saw her on 01/13/14, at which time I started her on Aricept 5 mg. In the interim, her PCP started her on Namenda long-acting. She was supposed to be on 28 mg once daily but her son was  not completely sure if she was on it. The patient is not able to tell and her other son did not now either.   I first met her on 09/13/2013 at which time she was accompanied by her friend Katelyn Sparks. The patient reported a history of memory problems for about one year including forgetfulness, misplacing things, easy frustration. I suggested further workup in the form of neuropsychological testing as well as MRI brain. She had a brain MRI without contrast on 01/09/2014: Abnormal MRI brain (without) demonstrating: 1. Moderate periventricular and subcortical chronic small vessel ischemic disease. 2. Punctate left parietal convexity chronic cerebral microhemorrhage. In addition, personally reviewed the images through the PACS system and explained the findings to the patient and her son.   Regarding her cognitive testing, received a message from Katelyn Sparks from Dr. Monico Hoar office on 12/10/13: "Katelyn Sparks dis not show for her appointment on 12/09/13 for neuropsychological evaluation. When contacted, she seemed confused and claimed no recollection of having made the appointment. She agreed to come in later the same morning but again did not show. Called her. She stated she went to the wrong place. She re-scheduled for 12/16/13. She called on 12/10/13 to cancel that appointment and did not wish to re-schedule."   She lives in her own home, is divorced and her 78 yo GS lives with her. This causes her stress at times. She drives, and has not  gotten lost driving. She has no VH or AH, no SI/HI, no paranoid delusions, no Hx of inappropriate behavior. Her father had dementia and died at the age of 64. He mother lived to be 74 and had no dementia. She had a total of 13 siblings, 3 died in childhood. No siblings with memory loss.   The patient denies prior TIA or stroke symptoms, such as sudden onset of one sided weakness, numbness, tingling, slurring of speech or droopy face, hearing loss, tinnitus, diplopia or visual  field cut or monocular loss of vision, and denies recurrent headaches. No falls, no head injury, no snoring was reported. 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   Blood work from 08/28/13: N CBC, N CMP and HbA1c of 6.6 and elevated cholesterol at 262 and LDL of 172.    Her Past Medical History Is Significant For: Past Medical History  Diagnosis Date  . Osteoarthrosis, unspecified whether generalized or localized, unspecified site   . Unspecified hemorrhoids without mention of complication   . Flatulence, eructation, and gas pain   . Esophageal reflux   . Blood in stool   . Dementia     Her Past Surgical History Is Significant For: Past Surgical History  Procedure Laterality Date  . Abdominal hysterectomy    . Colonoscopy  11/2010  . Upper gi endoscopy  11/2010    Her Family History Is Significant For: Family History  Problem Relation Age of Onset  . Diabetes    . Hypertension Mother   . Colon cancer Neg Hx   . Cancer Neg Hx   . Early death Neg Hx   . Heart disease Neg Hx   . Hyperlipidemia Neg Hx   . Kidney disease Neg Hx   . Learning disabilities Neg Hx   . Stroke Neg Hx   . Diabetes Mother   . Diabetes Sister   . Diabetes Brother     Her Social History Is Significant For: History   Social History  . Marital Status: Widowed    Spouse Name: N/A  . Number of Children: 2  . Years of Education: 12   Occupational History  . Retired    Social History Main Topics  . Smoking status: Never Smoker   . Smokeless tobacco: Never Used  . Alcohol Use: No  . Drug Use: No  . Sexual Activity: Not Currently   Other Topics Concern  . None   Social History Narrative   She is a retired from a [employer].   Her husband passed away in 02/08/00.  She has two son.   Patient is right handed.   Her grandson lives with her.   Patient drinks 1 cup of caffeine daily    Her Allergies Are:  No Known Allergies:   Her Current Medications Are:  Outpatient  Encounter Prescriptions as of 05/18/2015  Medication Sig  . atorvastatin (LIPITOR) 80 MG tablet TAKE 1 TABLET BY MOUTH DAILY  . donepezil (ARICEPT) 10 MG tablet Take 1 tablet (10 mg total) by mouth at bedtime.  . Memantine HCl ER (NAMENDA XR) 28 MG CP24 Take 28 mg by mouth daily.  . Probiotic Product (SOLUBLE FIBER/PROBIOTICS PO) Take by mouth.  . traZODone (DESYREL) 50 MG tablet Take 1 tablet (50 mg total) by mouth at bedtime as needed for sleep.  Marland Kitchen omeprazole (PRILOSEC) 40 MG capsule TAKE 1 CAPSULE BY MOUTH DAILY (Patient not taking: Reported on 05/18/2015)   No facility-administered encounter medications  on file as of 05/18/2015.  :  Review of Systems:  Out of a complete 14 point review of systems, all are reviewed and negative with the exception of these symptoms as listed below:  Review of Systems  Gastrointestinal:       GI upset, son asks if it may be medication related? Family encourages her to eat with medication. Reports that Ginger Tea may help with symptoms.   Neurological:       Patient is not sleeping well. Reports taking the trazodone around 6pm.     Objective:  Neurologic Exam  Physical Exam Physical Examination:   Filed Vitals:   05/18/15 1202  BP: 118/62  Pulse: 72  Resp: 14   General Examination: The patient is a very pleasant 80 y.o. female in no acute distress. She is calm and cooperative with the exam. She is well groomed and situated in a chair.   HEENT: Normocephalic, atraumatic, pupils are equal, round and reactive to light and accommodation. Funduscopic exam is normal with sharp disc margins noted. Extraocular tracking shows mild saccadic breakdown without nystagmus noted. Hearing is impaired mildly. Face is symmetric with no facial masking and normal facial sensation. There is no lip, neck or jaw tremor. Neck is mildly rigid with intact passive ROM. There are no carotid bruits on auscultation. Oropharynx exam reveals mild to moderate mouth dryness. Moderate  airway crowding is noted. Mallampati is class III. Tongue protrudes centrally and palate elevates symmetrically.    Chest: is clear to auscultation without wheezing, rhonchi or crackles noted.  Heart: sounds are regular and normal without murmurs, rubs or gallops noted.   Abdomen: is soft, non-tender and non-distended with normal bowel sounds appreciated on auscultation.  Extremities: There is no pitting edema in the distal lower extremities bilaterally. Pedal pulses are intact.   Skin: is warm and dry with no trophic changes noted. Age-related changes are noted on the skin.   Musculoskeletal: exam reveals no obvious joint deformities, tenderness or joint swelling or erythema.   Neurologically:  Mental status: The patient is awake and alert, paying good  attention. She is able to partially provide some of her history. She is oriented to: person, place, situation, day of week and year. Her memory, attention, language and knowledge are impaired. There is no aphasia, agnosia, apraxia or anomia. There is a mild degree of bradyphrenia. Speech is mildly hypophonic with no dysarthria noted. Mood is congruent and affect is normal.   On 09/13/13: Her MMSE (Mini-Mental state exam) score was 21/30, CDT (Clock Drawing Test) score was 4/4 and AFT (Animal Fluency Test) score was 10/min.  On 05/28/14: Her MMSE: 20/30, CDT: 4/4, AFT: 9/min.  On 03/02/2015: MMSE: 22/30, CDT: 3/4, AFT: 11/min, GDS: 0/15.   Cranial nerves are as described above under HEENT exam. In addition, shoulder shrug is normal with equal shoulder height noted.  Motor exam: Normal bulk, and strength for age is noted. Tone is not rigid with absence of cogwheeling in the extremities. There is overall no significant bradykinesia. There is no drift or rebound. There is no tremor.   Romberg is negative. Reflexes are 1+ in the upper extremities and 1+ in the lower extremities. Fine motor skills: Finger taps, hand movements, and rapid alternating  patting are not impaired bilaterally. Foot taps and foot agility are not impaired bilaterally.   Cerebellar testing shows no dysmetria or intention tremor on finger to nose testing. There is no truncal or gait ataxia.   Sensory exam is intact  to light touch and vibration sense in the upper and lower extremities.   Gait, station and balance: She stands up from the seated position with no difficulty and needs no assistance. No veering to one side is noted. No leaning to one side. Posture is mildly stooped, age-appropriate. Stance is narrow-based. She turns en bloc. Tandem walk is not possible for her and balance is mildly impaired.   Assessment and Plan:   In summary, Katelyn Sparks is a very pleasant 80 year-old female with an underlying medical history of HLP, reflux disease, and left shoulder pain, who presents for follow-up consultation of her memory loss of nearly 2 years' duration. Her history and physical exam are keeping with memory loss likely beyond MCI, possibly early dementia of the Alzheimer's type vs mixed dementia, without evidence of behavioral disturbance. She had moderate WM disease on her brain MRI as well as chronic microhemorrhage on the L. We will repeat an MRI down the Road in order to monitor her white matter changes. She had neuropsychological evaluation in April 2015, with results in keeping with mild dementia. She has been on Aricept 10 mg and Namenda long-acting 28 mg once daily. I have asked her  to continue with generic Aricept 10 mg once daily and long-acting Namenda 28 mg once daily. For her sleep, I suggested she try melatonin, 5 mg or up to 10 mg 1-2 hours before her projected bedtime. She feels that the trazodone has not helped. I would be reluctant to increase it given her age and potential for side effects. She is advised to continue to drink plenty of water, at least 6 glasses per day. For constipation in addition to better water intake, she is advised to stay active  physically and increase her natural fiber intake and use an over-the-counter laxity of as needed, if she has not had a bowel movement for 2-3 days. We will monitor her symptoms. Sometimes Aricept and also Namenda can cause GI related side effects. Since her memory scores have been stable on medication I would be reluctant to reduce them. I explained that to her son. I will see her back in 3 months for her scheduled appointment. I answered all her questions today and the patient and her son were in agreement.  I spent 20 minutes in total face-to-face time with the patient, more than 50% of which was spent in counseling and coordination of care, reviewing test results, reviewing medication and discussing or reviewing the diagnosis of dementia, its prognosis and treatment options.

## 2015-05-18 NOTE — Patient Instructions (Addendum)
I think overall you are doing fairly well but I do want to suggest a few things today:  Remember to drink plenty of fluid, eat healthy meals and do not skip any meals. Try to eat protein with a every meal and eat a healthy snack such as fruit or nuts in between meals. Try to keep a regular sleep-wake schedule and try to exercise daily, particularly in the form of walking, 20-30 minutes a day, if you can. Good nutrition, proper sleep and exercise can help her cognitive function.  Engage in social activities in your community and with your family and try to keep up with current events by reading the newspaper or watching the news. If you have computer and can go online, try [email or web address]. Also, you may like to do word finding puzzles or crossword puzzles.  As far as your medications are concerned, I would like to suggest that you use melatonin at night for sleep: take 5 to 10 mg one to 2 hours before your bedtime. For your constipation: drink about 6 glasses of water per day, increase your fiber intake, use an over the counter laxative, if you have not had a bowel movement in 2 or 3 days.    I would like to see you back in 3 months, for your scheduled appointment. Please call us with any interim questions, concerns, problems, updates or refill requests.  Our phone number is [phone]. We also have an after hours call service for urgent matters and there is a physician on-call for urgent questions. For any emergencies you know to call 911 or go to the nearest emergency room.

## 2015-05-28 ENCOUNTER — Telehealth: Payer: Self-pay | Admitting: Internal Medicine

## 2015-05-28 DIAGNOSIS — K219 Gastro-esophageal reflux disease without esophagitis: Secondary | ICD-10-CM

## 2015-05-28 DIAGNOSIS — K5909 Other constipation: Secondary | ICD-10-CM

## 2015-05-28 NOTE — Telephone Encounter (Signed)
Son Katelyn Sparks called in and pt would like to have a referral to GI.  She is still having lots of stomach pain    Please call [facility] that is on DPR for appt [phone]

## 2015-05-28 NOTE — Telephone Encounter (Signed)
Referral sent 

## 2015-05-28 NOTE — Telephone Encounter (Signed)
Krista notified

## 2015-05-29 ENCOUNTER — Telehealth: Payer: Self-pay | Admitting: Internal Medicine

## 2015-05-29 NOTE — Telephone Encounter (Signed)
Patient and her son came into the office they are given an appt with Dr. Leone Payor for 06/18/15 11:15

## 2015-05-29 NOTE — Telephone Encounter (Signed)
Left message for patient to call back  

## 2015-06-18 ENCOUNTER — Encounter: Payer: Self-pay | Admitting: Internal Medicine

## 2015-06-18 ENCOUNTER — Ambulatory Visit (INDEPENDENT_AMBULATORY_CARE_PROVIDER_SITE_OTHER): Payer: Medicare Other | Admitting: Internal Medicine

## 2015-06-18 VITALS — BP 108/60 | HR 72 | Ht 64.5 in | Wt 127.4 lb

## 2015-06-18 DIAGNOSIS — K648 Other hemorrhoids: Secondary | ICD-10-CM

## 2015-06-18 DIAGNOSIS — G4700 Insomnia, unspecified: Secondary | ICD-10-CM | POA: Diagnosis not present

## 2015-06-18 DIAGNOSIS — R195 Other fecal abnormalities: Secondary | ICD-10-CM | POA: Diagnosis not present

## 2015-06-18 DIAGNOSIS — R110 Nausea: Secondary | ICD-10-CM | POA: Diagnosis not present

## 2015-06-18 NOTE — Patient Instructions (Signed)
  You have been scheduled for a colonoscopy. Please follow written instructions given to you at your visit today.  Please pick up your prep supplies at the pharmacy. If you use inhalers (even only as needed), please bring them with you on the day of your procedure.    Take Miralax daily.    I appreciate the opportunity to care for you. Stan Headarl Gessner, MD, [facility]

## 2015-06-18 NOTE — Progress Notes (Signed)
   Subjective:  Cc: thin stools and hemorrhoids  Patient ID: Katelyn FragminAnnie P Sparks, female    DOB: 02/09/1935, 80 y.o.   MRN: [id number] Cc: thin stools, rectal bleeding HPI Pencil thin stools and round balls. Hemorrhoids bother me - bleed and protrude. Tries to eat fiber Son here today and came after and tells me she has frequent and persistent nausea with some anorexia. No early satiety. Has terrible insomnia Medications, allergies, past medical history, past surgical history, family history and social history are reviewed and updated in the EMR.   Review of Systems As above    Objective:   Physical Exam BP 108/60 mmHg  Pulse 72  Ht 5' 4.5" (1.638 m)  Wt 127 lb 6 oz (57.777 kg)  BMI 21.53 kg/m2 NAD A and O x 3 abd soft and NT  Rectal: female staff present Anoderm inspection revealed protruding Gr 3 int/ext hemorrhoids and anal tags all positions  Digital exam revealed normal resting tone and voluntary squeeze. No mass  Present - small rectocele Simulated defecation with valsalva revealed appropriate abdominal contraction and descent.       Assessment & Plan:   Thin stools  Internal and external prolapsed hemorrhoids  Nausea without vomiting  Insomnia  Colonoscopy The risks and benefits as well as alternatives of endoscopic procedure(s) have been discussed and reviewed. All questions answered. The patient agrees to proceed.  Daily miralax  Consider mirtazipine  ? Hemorrhoid banding

## 2015-07-29 ENCOUNTER — Ambulatory Visit (AMBULATORY_SURGERY_CENTER): Payer: Medicare Other | Admitting: Internal Medicine

## 2015-07-29 ENCOUNTER — Encounter: Payer: Self-pay | Admitting: Internal Medicine

## 2015-07-29 VITALS — BP 102/45 | HR 60 | Temp 97.9°F | Resp 29 | Ht 64.0 in | Wt 127.0 lb

## 2015-07-29 DIAGNOSIS — R195 Other fecal abnormalities: Secondary | ICD-10-CM

## 2015-07-29 DIAGNOSIS — K648 Other hemorrhoids: Secondary | ICD-10-CM | POA: Diagnosis not present

## 2015-07-29 DIAGNOSIS — K5730 Diverticulosis of large intestine without perforation or abscess without bleeding: Secondary | ICD-10-CM | POA: Diagnosis not present

## 2015-07-29 MED ORDER — SODIUM CHLORIDE 0.9 % IV SOLN: 500.0000 mL | INTRAVENOUS | Status: DC

## 2015-07-29 NOTE — Patient Instructions (Addendum)
No cancer or polyps. Your large hemorrhoids are causing the thin stools.  Take MiraLax every day to help with constipation. You may need this twice a day.  No further colonoscopy necessary.  I appreciate the opportunity to care for you. Iva Boop, MD, FACG YOU HAD AN ENDOSCOPIC PROCEDURE TODAY AT THE [facility]:   Refer to the procedure report that was given to you for any specific questions about what was found during the examination.  If the procedure report does not answer your questions, please call your gastroenterologist to clarify.  If you requested that your care partner not be given the details of your procedure findings, then the procedure report has been included in a sealed envelope for you to review at your convenience later.  YOU SHOULD EXPECT: Some feelings of bloating in the abdomen. Passage of more gas than usual.  Walking can help get rid of the air that was put into your GI tract during the procedure and reduce the bloating. If you had a lower endoscopy (such as a colonoscopy or flexible sigmoidoscopy) you may notice spotting of blood in your stool or on the toilet paper. If you underwent a bowel prep for your procedure, you may not have a normal bowel movement for a few days.  Please Note:  You might notice some irritation and congestion in your nose or some drainage.  This is from the oxygen used during your procedure.  There is no need for concern and it should clear up in a day or so.  SYMPTOMS TO REPORT IMMEDIATELY:   Following lower endoscopy (colonoscopy or flexible sigmoidoscopy):  Excessive amounts of blood in the stool  Significant tenderness or worsening of abdominal pains  Swelling of the abdomen that is new, acute  Fever of 100F or higher  For urgent or emergent issues, a gastroenterologist can be reached at any hour by calling [phone].   DIET: Your first meal following the procedure should be a small meal and then it is  ok to progress to your normal diet. Heavy or fried foods are harder to digest and may make you feel nauseous or bloated.  Likewise, meals heavy in dairy and vegetables can increase bloating.  Drink plenty of fluids but you should avoid alcoholic beverages for 24 hours.  ACTIVITY:  You should plan to take it easy for the rest of today and you should NOT DRIVE or use heavy machinery until tomorrow (because of the sedation medicines used during the test).    FOLLOW UP: Our staff will call the number listed on your records the next business day following your procedure to check on you and address any questions or concerns that you may have regarding the information given to you following your procedure. If we do not reach you, we will leave a message.  However, if you are feeling well and you are not experiencing any problems, there is no need to return our call.  We will assume that you have returned to your regular daily activities without incident.  If any biopsies were taken you will be contacted by phone or by letter within the next 1-3 weeks.  Please call us at [phone] if you have not heard about the biopsies in 3 weeks.    SIGNATURES/CONFIDENTIALITY: You and/or your care partner have signed paperwork which will be entered into your electronic medical record.  These signatures attest to the fact that that the information above on your After Visit Summary  has been reviewed and is understood.  Full responsibility of the confidentiality of this discharge information lies with you and/or your care-partner.  Please review diverticulosis and hemorrhoid handouts provided. Miralax

## 2015-07-29 NOTE — Progress Notes (Signed)
Transferred to recovery room. A/O x3, pleased with MAC.  VSS.  Report to wendy, RN. 

## 2015-07-29 NOTE — Op Note (Signed)
[facility] [address] [place], [address]   COLONOSCOPY PROCEDURE REPORT  PATIENT: Katelyn, Sparks  MR#: [id number] BIRTHDATE: 08-Jun-1935 , 80  yrs. old GENDER: female ENDOSCOPIST: Iva Boop, MD, [facility] REFERRED BY: PROCEDURE DATE:  07/29/2015 PROCEDURE:   Colonoscopy, diagnostic First Screening Colonoscopy - Avg.  risk and is 50 yrs.  old or older - No.  Prior Negative Screening - Now for repeat screening. N/A  History of Adenoma - Now for follow-up colonoscopy & has been > or = to 3 yrs.  N/A  Polyps removed today? No Recommend repeat exam, <10 yrs? No ASA CLASS:   Class I INDICATIONS:change in bowel habits - thin stools. MEDICATIONS: Propofol 175 mg IV and Monitored anesthesia care  DESCRIPTION OF PROCEDURE:   After the risks benefits and alternatives of the procedure were thoroughly explained, informed consent was obtained.  The digital rectal exam revealed internal hemorrhoids and revealed external hemorrhoids.   The [id number]  endoscope was introduced through the anus and advanced to the cecum, which was identified by both the appendix and ileocecal valve. No adverse events experienced.   The quality of the prep was excellent.  (MiraLax was used)  The instrument was then slowly withdrawn as the colon was fully examined. Estimated blood loss is zero unless otherwise noted in this procedure report.   COLON FINDINGS: There was mild diverticulosis noted in the sigmoid colon.   Large external and internal Grade III hemorrhoids were found.   The examination was otherwise normal.  Retroflexed views revealed internal hemorrhoids and Retroflexed views revealed external hemorrhoids. The time to cecum = 3.5 Withdrawal time = 5.1 The scope was withdrawn and the procedure completed. COMPLICATIONS: There were no immediate complications.  ENDOSCOPIC IMPRESSION: 1.   Mild diverticulosis was noted in the sigmoid colon 2.   Large external and internal  Grade III hemorrhoids 3.   The examination was otherwise normal  RECOMMENDATIONS: Miralax No further colonoscopy Think hemorrhoids would require surgery but doubt she is a good candidate  (elderly, dementia)  eSigned:  Iva Boop, MD, [facility] 07/29/2015 4:15 PM   cc: The Patient

## 2015-07-30 ENCOUNTER — Telehealth: Payer: Self-pay | Admitting: *Deleted

## 2015-07-30 NOTE — Telephone Encounter (Signed)
  Follow up Call-  Call back number 07/29/2015  Post procedure Call Back phone  # [phone]  Permission to leave phone message Yes   Mailbox full- not able to leave a message

## 2015-08-03 LAB — HM COLONOSCOPY: HM Colonoscopy: NORMAL

## 2015-09-02 ENCOUNTER — Ambulatory Visit: Payer: Medicare Other | Admitting: Neurology

## 2015-09-07 ENCOUNTER — Encounter: Payer: Self-pay | Admitting: Neurology

## 2015-09-07 ENCOUNTER — Ambulatory Visit (INDEPENDENT_AMBULATORY_CARE_PROVIDER_SITE_OTHER): Payer: Medicare Other | Admitting: Neurology

## 2015-09-07 VITALS — BP 114/62 | HR 62 | Resp 16 | Ht 66.0 in | Wt 129.0 lb

## 2015-09-07 DIAGNOSIS — F068 Other specified mental disorders due to known physiological condition: Secondary | ICD-10-CM | POA: Diagnosis not present

## 2015-09-07 DIAGNOSIS — G4700 Insomnia, unspecified: Secondary | ICD-10-CM

## 2015-09-07 DIAGNOSIS — F0390 Unspecified dementia without behavioral disturbance: Secondary | ICD-10-CM

## 2015-09-07 NOTE — Patient Instructions (Signed)
We will continue with donepezil 10 mg once daily.  We will try to see if you can get Namenda XR through your insurance.  We may resort to using namenda generic (memantine) 5 mg twice daily. Please call for an update in 2 weeks, [name].   You should look into getting healthcare POA.  Follow up in 4 months.

## 2015-09-07 NOTE — Progress Notes (Signed)
Subjective:    Patient ID: Katelyn Sparks is a 80 y.o. female.  HPI     Interim history:   Katelyn Sparks is a very pleasant 80 year old right-handed woman with an underlying medical history of diabetes, reflux disease, and left shoulder pain, who presents for followup consultation of her memory loss. The patient is accompanied by her son, Katelyn Sparks, today (he is from [place]). I last saw her on 05/18/2015, at which time she reported problems with insomnia. She had chronic constipation. Trazodone was not helpful. She was still driving, but locally only. She was trying to drink enough water. Appetite was reportedly good. I suggested she continue with the dual memory medications. For sleep, I suggested she try over-the-counter melatonin. For constipation, she was advised to increase her natural fiber intake and use an over-the-counter laxative as needed. She was seen by GI in the interim on 06/18/15, I reviewed the note. She had a colonoscopy on 07/29/2015. She was diagnosed with hemorrhoids. She was found to have mild diverticulosis.   Today, 09/07/2015: She reports doing okay, her son gives most of her history. They recently returned from a trip to the beach. Katelyn Sparks is her other son who is in town. I have talked to him before. She is no longer on Namenda long-acting. She was on it when she had samples but is currently only on donepezil. Katelyn Sparks talks to her on the phone almost daily. He has noticed some changes in her mood. She is more irritable. She denies this. He would like to encourage her to be more active mentally. He is trying to get her to do some word puzzles. She claims that she reads daily. She is on melatonin every night. He feels that it may be helpful but she feels that it does not help.  Previously:   I saw her on 03/02/2015, at which time she reported going to the gym once or twice a week. She was not completely sure about her memory, medications. She was unaccompanied at the time. She was drinking  about 4 glasses of water per day and appetite was okay. Her grandson was living with her. She reported no recent falls and felt her memory is stable. Her MMSE was 22 at the time, and she was advised to continue with Aricept 10 mg and Namenda long-acting 28 mg once daily.  I saw her on 05/28/2014, at which time her son felt that she had stabilized. She was on Aricept 5 mg. I increased this to 10 mg. She was supposed be on Namenda long-acting 28 mg once daily. She still lived with her 18 year old grandson. She was driving some. In the interim, she was seen by our nurse practitioner, Katelyn Sparks, on 11/03/2014, at which time her MMSE was 22. She was complaining of constipation. She had been prescribed Belsomra for insomnia but her primary care physician. She was advised to continue with Aricept at 10 mg and Namenda long-acting at 28 mg once daily.   I saw her on 01/13/14, at which time I started her on Aricept 5 mg. In the interim, her PCP started her on Namenda long-acting. She was supposed to be on 28 mg once daily but her son was not completely sure if she was on it. The patient is not able to tell and her other son did not now either.   I first met her on 09/13/2013 at which time she was accompanied by her friend Katelyn Sparks. The patient reported a history of memory problems for  about one year including forgetfulness, misplacing things, easy frustration. I suggested further workup in the form of neuropsychological testing as well as MRI brain. She had a brain MRI without contrast on 01/09/2014: Abnormal MRI brain (without) demonstrating: 1. Moderate periventricular and subcortical chronic small vessel ischemic disease. 2. Punctate left parietal convexity chronic cerebral microhemorrhage. In addition, personally reviewed the images through the PACS system and explained the findings to the patient and her son.   Regarding her cognitive testing, received a message from Katelyn Sparks from Dr. Monico Hoar office on  12/10/13: "[name] dis not show for her appointment on 12/09/13 for neuropsychological evaluation. When contacted, she seemed confused and claimed no recollection of having made the appointment. She agreed to come in later the same morning but again did not show. Called her. She stated she went to the wrong place. She re-scheduled for 12/16/13. She called on 12/10/13 to cancel that appointment and did not wish to re-schedule."   She lives in her own home, is divorced and her 78 yo GS lives with her. This causes her stress at times. She drives, and has not gotten lost driving. She has no VH or AH, no SI/HI, no paranoid delusions, no Hx of inappropriate behavior. Her father had dementia and died at the age of 7. He mother lived to be 38 and had no dementia. She had a total of 13 siblings, 3 died in childhood. No siblings with memory loss.   The patient denies prior TIA or stroke symptoms, such as sudden onset of one sided weakness, numbness, tingling, slurring of speech or droopy face, hearing loss, tinnitus, diplopia or visual field cut or monocular loss of vision, and denies recurrent headaches. No falls, no head injury, no snoring was reported. 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   Blood work from 08/28/13: N CBC, N CMP and HbA1c of 6.6 and elevated cholesterol at 262 and LDL of 172.    Her Past Medical History Is Significant For: Past Medical History  Diagnosis Date  . Osteoarthrosis, unspecified whether generalized or localized, unspecified site   . Unspecified hemorrhoids without mention of complication   . Flatulence, eructation, and gas pain   . Esophageal reflux   . Blood in stool   . Dementia     Her Past Surgical History Is Significant For: Past Surgical History  Procedure Laterality Date  . Abdominal hysterectomy    . Colonoscopy  11/2010  . Upper gi endoscopy  11/2010    Her Family History Is Significant For: Family History  Problem Relation Age of Onset  .  Diabetes    . Hypertension Mother   . Colon cancer Neg Hx   . Cancer Neg Hx   . Early death Neg Hx   . Heart disease Neg Hx   . Hyperlipidemia Neg Hx   . Kidney disease Neg Hx   . Learning disabilities Neg Hx   . Stroke Neg Hx   . Diabetes Mother   . Diabetes Sister   . Diabetes Brother     Her Social History Is Significant For: Social History   Social History  . Marital Status: Widowed    Spouse Name: N/A  . Number of Children: 2  . Years of Education: 12   Occupational History  . Retired    Social History Main Topics  . Smoking status: Never Smoker   . Smokeless tobacco: Never Used  . Alcohol Use: No  . Drug Use:  No  . Sexual Activity: Not Currently   Other Topics Concern  . None   Social History Narrative   She is a retired from a [employer].   Her husband passed away in 2000-02-05.  She has two son.   Patient is right handed.   Her grandson lives with her.   Patient drinks 1 cup of caffeine daily    Her Allergies Are:  No Known Allergies:   Her Current Medications Are:  Outpatient Encounter Prescriptions as of 09/07/2015  Medication Sig  . atorvastatin (LIPITOR) 80 MG tablet TAKE 1 TABLET BY MOUTH DAILY  . donepezil (ARICEPT) 10 MG tablet Take 1 tablet (10 mg total) by mouth at bedtime.  . Melatonin 10 MG TABS Take by mouth.  . Omega-3 Fatty Acids (FISH OIL) 1000 MG CAPS Take by mouth.  Marland Kitchen omeprazole (PRILOSEC) 40 MG capsule TK 1 C PO D  . [DISCONTINUED] traZODone (DESYREL) 50 MG tablet Take 1 tablet (50 mg total) by mouth at bedtime as needed for sleep.  . Memantine HCl ER (NAMENDA XR) 28 MG CP24 Take 28 mg by mouth daily. (Patient not taking: Reported on 09/07/2015)  . [DISCONTINUED] omeprazole (PRILOSEC) 40 MG capsule TAKE 1 CAPSULE BY MOUTH DAILY  . [DISCONTINUED] Probiotic Product (SOLUBLE FIBER/PROBIOTICS PO) Take by mouth.   No facility-administered encounter medications on file as of 09/07/2015.  :  Review of Systems:  Out of a complete 14 point  review of systems, all are reviewed and negative with the exception of these symptoms as listed below:   Review of Systems  Neurological:       Patient reports that she does not sleep at night.  Son asks if patient can do occupational therapy.     Objective:  Neurologic Exam  Physical Exam Physical Examination:   Filed Vitals:   09/07/15 0957  BP: 114/62  Pulse: 62  Resp: 16   General Examination: The patient is a very pleasant 80 y.o. female in no acute distress. She is calm and cooperative with the exam. She is well groomed and situated in a chair.   HEENT: Normocephalic, atraumatic, pupils are equal, round and reactive to light and accommodation. Extraocular tracking shows mild saccadic breakdown without nystagmus noted. Hearing is impaired mildly. Face is symmetric with no facial masking and normal facial sensation. There is no lip, neck or jaw tremor. Neck is mildly rigid with intact passive ROM. There are no carotid bruits on auscultation. Oropharynx exam reveals mild to moderate mouth dryness. Moderate airway crowding is noted. Mallampati is class III. Tongue protrudes centrally and palate elevates symmetrically.    Chest: is clear to auscultation without wheezing, rhonchi or crackles noted.  Heart: sounds are regular and normal without murmurs, rubs or gallops noted.   Abdomen: is soft, non-tender and non-distended with normal bowel sounds appreciated on auscultation.  Extremities: There is no pitting edema in the distal lower extremities bilaterally. Pedal pulses are intact.   Skin: is warm and dry with no trophic changes noted. Age-related changes are noted on the skin.   Musculoskeletal: exam reveals no obvious joint deformities, tenderness or joint swelling or erythema.   Neurologically:   Mental status: The patient is awake and alert, paying good  attention. She is able to partially provide some of her history. She is oriented to: person, place, situation, day of  week and year. Her memory, attention, language and knowledge are impaired. There is no aphasia, agnosia, apraxia or anomia. There is a mild  degree of bradyphrenia. Speech is mildly hypophonic with no dysarthria noted. Mood is congruent and affect is normal.   On 09/13/13: Her MMSE (Mini-Mental state exam) score was 21/30, CDT (Clock Drawing Test) score was 4/4 and AFT (Animal Fluency Test) score was 10/min.  On 05/28/14: Her MMSE: 20/30, CDT: 4/4, AFT: 9/min.  On 03/02/2015: MMSE: 22/30, CDT: 3/4, AFT: 11/min, GDS: 0/15.   On 09/07/2015: MMSE: 24/30, CDT: 4/4, AFT: 12/min.   Cranial nerves are as described above under HEENT exam. In addition, shoulder shrug is normal with equal shoulder height noted.  Motor exam: Normal bulk, and strength for age is noted. Tone is not rigid with absence of cogwheeling in the extremities. There is overall no significant bradykinesia. There is no drift or rebound. There is no tremor.   Romberg is negative. Reflexes are 1+ in the upper extremities and 1+ in the lower extremities. Fine motor skills: Finger taps, hand movements, and rapid alternating patting are not impaired bilaterally. Foot taps and foot agility are not impaired bilaterally.   Cerebellar testing shows no dysmetria or intention tremor on finger to nose testing. There is no truncal or gait ataxia.   Sensory exam is intact to light touch and vibration sense in the upper and lower extremities.   Gait, station and balance: She stands up from the seated position with no difficulty and needs no assistance. No veering to one side is noted. No leaning to one side. Posture is mildly stooped, age-appropriate. Stance is narrow-based. She turns en bloc. Tandem walk is very difficult for her. Her balance is mildly impaired.   Assessment and Plan:   In summary, Katelyn Sparks is a very pleasant 80 year-old female with an underlying medical history of HLP, reflux disease, and left shoulder pain, who presents for  follow-up consultation of her memory loss of  about 2-3 years' duration. Her history and physical exam are keeping with  Alzheimer's dementia versus mixed dementia. She has no overt behavioral disturbance but her son has noted some mood irritability. We will continue to monitor. She was at one point on Aricept generic 10 mg and Namenda long-acting 28 mg daily but she is no longer on the long-acting Namenda because of cost. We will try to look into whether her insurance would pay for long-acting Namenda. Otherwise, we can certainly try to start her on memantine 5 mg twice daily. Her memory scores are stable. She is advised to drink enough water and stay active mentally and physically. I have asked her son to try to get healthcare power of attorney. I also provided him with some information for community activities that she can participate in.  For now, she will continue with generic Aricept 10 mg once daily. I've asked Katelyn Sparks to email or call in about 2 weeks for an update on the question regarding Namenda XR. I have emailed our [employer]  as well. For sleep, if asked her to continue to try melatonin. A prescription sleeping pill may further impair her balance and her cognition and I think it is too risky to use a prescription sleep aid. I will see her back in 3 to 4 months, sooner if needed.  I spent 25 minutes in total face-to-face time with the patient, more than 50% of which was spent in counseling and coordination of care, reviewing test results, reviewing medication and discussing or reviewing the diagnosis of dementia, its prognosis and treatment options.

## 2015-09-24 ENCOUNTER — Encounter: Payer: Self-pay | Admitting: Internal Medicine

## 2015-10-13 ENCOUNTER — Telehealth: Payer: Self-pay | Admitting: Neurology

## 2015-10-13 NOTE — Telephone Encounter (Signed)
Per last note Dr. Frances FurbishAthar wanted to check and see if insurance will approve Namenda XR. I do not see any further information on that. Can you check on this? If insurance denies Dr. Frances FurbishAthar recommended memantine 5mg .

## 2015-10-13 NOTE — Telephone Encounter (Signed)
Caryn BeeKevin pt's son called inquiring about Namenda XR as Dr Frances FurbishAthar requested in last OV. Please call and advise at [phone]

## 2015-10-13 NOTE — Telephone Encounter (Signed)
Namenda is covered under patient's ins plan.  I spoke with pharmacist, Rennis HardingEllis, who verified the patient has been getting this Rx on a regular basis from Dr Yetta BarreJones.  Says she just picked up last refill on 10/19, and the co-pay is $35.  They are unable to refill Rx at this time, as it is too soon for ins to pay again.  I called back to relay this info.  Got no answer.  Left message.

## 2015-10-14 ENCOUNTER — Ambulatory Visit: Payer: Medicare Other | Admitting: Internal Medicine

## 2015-10-30 ENCOUNTER — Telehealth: Payer: Self-pay | Admitting: Internal Medicine

## 2015-10-30 NOTE — Telephone Encounter (Signed)
Called number below. Someone answered and when I introduced myself they hung up.   Did not see Randolm IdolKrista Sparks on new DPR.

## 2015-10-30 NOTE — Telephone Encounter (Signed)
Katelyn Sparks, family friend, needs to go over exactly what medication she needs to be taking. [facility]. Can you please call her at [phone]

## 2015-11-02 NOTE — Telephone Encounter (Signed)
[name] and [facility] would like a call back about pt meds.  Please call her at [phone]

## 2015-11-02 NOTE — Telephone Encounter (Signed)
I called pt's son- I spoke to him and tried to answer all questions. OV scheduled for 11/10/15. I advised pt's son to bring all her meds to OV.

## 2015-11-02 NOTE — Telephone Encounter (Signed)
[name] called back to follow up. Advised that i apologize, but Katelyn Sparks does have the information. She wants to review the medications. Verified that she is on the 2015 DPR, which is most recent.

## 2015-11-10 ENCOUNTER — Other Ambulatory Visit (INDEPENDENT_AMBULATORY_CARE_PROVIDER_SITE_OTHER): Payer: Medicare Other

## 2015-11-10 ENCOUNTER — Encounter: Payer: Self-pay | Admitting: Internal Medicine

## 2015-11-10 ENCOUNTER — Ambulatory Visit (INDEPENDENT_AMBULATORY_CARE_PROVIDER_SITE_OTHER): Payer: Medicare Other | Admitting: Internal Medicine

## 2015-11-10 VITALS — BP 120/68 | HR 66 | Temp 98.1°F | Resp 16 | Ht 66.0 in | Wt 134.0 lb

## 2015-11-10 DIAGNOSIS — K5909 Other constipation: Secondary | ICD-10-CM

## 2015-11-10 DIAGNOSIS — Z0000 Encounter for general adult medical examination without abnormal findings: Secondary | ICD-10-CM | POA: Diagnosis not present

## 2015-11-10 DIAGNOSIS — F068 Other specified mental disorders due to known physiological condition: Secondary | ICD-10-CM

## 2015-11-10 DIAGNOSIS — E785 Hyperlipidemia, unspecified: Secondary | ICD-10-CM

## 2015-11-10 DIAGNOSIS — R739 Hyperglycemia, unspecified: Secondary | ICD-10-CM | POA: Diagnosis not present

## 2015-11-10 DIAGNOSIS — K5900 Constipation, unspecified: Secondary | ICD-10-CM | POA: Diagnosis not present

## 2015-11-10 DIAGNOSIS — Z23 Encounter for immunization: Secondary | ICD-10-CM | POA: Diagnosis not present

## 2015-11-10 DIAGNOSIS — F0390 Unspecified dementia without behavioral disturbance: Secondary | ICD-10-CM

## 2015-11-10 LAB — LIPID PANEL
Cholesterol: 173 mg/dL (ref 0–200)
HDL: 69.5 mg/dL
LDL Cholesterol: 88 mg/dL (ref 0–99)
NonHDL: 103.94
Total CHOL/HDL Ratio: 2
Triglycerides: 81 mg/dL (ref 0.0–149.0)
VLDL: 16.2 mg/dL (ref 0.0–40.0)

## 2015-11-10 LAB — CBC WITH DIFFERENTIAL/PLATELET
Basophils Absolute: 0 10*3/uL (ref 0.0–0.1)
Basophils Relative: 0.4 % (ref 0.0–3.0)
Eosinophils Absolute: 0.1 10*3/uL (ref 0.0–0.7)
Eosinophils Relative: 1.6 % (ref 0.0–5.0)
HCT: 37.7 % (ref 36.0–46.0)
Hemoglobin: 13 g/dL (ref 12.0–15.0)
Lymphocytes Relative: 22.6 % (ref 12.0–46.0)
Lymphs Abs: 1.2 10*3/uL (ref 0.7–4.0)
MCHC: 34.5 g/dL (ref 30.0–36.0)
MCV: 86.6 fl (ref 78.0–100.0)
Monocytes Absolute: 0.4 10*3/uL (ref 0.1–1.0)
Monocytes Relative: 6.9 % (ref 3.0–12.0)
Neutro Abs: 3.5 10*3/uL (ref 1.4–7.7)
Neutrophils Relative %: 68.5 % (ref 43.0–77.0)
Platelets: 194 10*3/uL (ref 150.0–400.0)
RBC: 4.35 Mil/uL (ref 3.87–5.11)
RDW: 14.4 % (ref 11.5–15.5)
WBC: 5.2 10*3/uL (ref 4.0–10.5)

## 2015-11-10 LAB — COMPREHENSIVE METABOLIC PANEL
ALBUMIN: 3.9 g/dL (ref 3.5–5.2)
ALK PHOS: 95 U/L (ref 39–117)
ALT: 15 U/L (ref 0–35)
AST: 18 U/L (ref 0–37)
BUN: 20 mg/dL (ref 6–23)
CALCIUM: 9.4 mg/dL (ref 8.4–10.5)
CHLORIDE: 106 meq/L (ref 96–112)
CO2: 29 mEq/L (ref 19–32)
Creatinine, Ser: 0.78 mg/dL (ref 0.40–1.20)
GFR: 91.72 mL/min (ref >60.00)
Glucose, Bld: 101 mg/dL — ABNORMAL HIGH (ref 70–99)
POTASSIUM: 3.4 meq/L — AB (ref 3.5–5.1)
Sodium: 142 mEq/L (ref 135–145)
TOTAL PROTEIN: 7 g/dL (ref 6.0–8.3)
Total Bilirubin: 0.4 mg/dL (ref 0.2–1.2)

## 2015-11-10 LAB — HEMOGLOBIN A1C: Hgb A1c MFr Bld: 6 % (ref 4.6–6.5)

## 2015-11-10 LAB — TSH: TSH: 1.97 u[IU]/mL (ref 0.35–4.50)

## 2015-11-10 MED ORDER — LINACLOTIDE 145 MCG PO CAPS: 145.0000 ug | ORAL_CAPSULE | Freq: Every day | ORAL | Status: DC

## 2015-11-10 NOTE — Progress Notes (Signed)
Pre visit review using our clinic review tool, if applicable. No additional management support is needed unless otherwise documented below in the visit note. 

## 2015-11-10 NOTE — Assessment & Plan Note (Signed)

## 2015-11-10 NOTE — Patient Instructions (Signed)
Preventive Care for Adults, Female A healthy lifestyle and preventive care can promote health and wellness. Preventive health guidelines for women include the following key practices.  A routine yearly physical is a good way to check with your health care provider about your health and preventive screening. It is a chance to share any concerns and updates on your health and to receive a thorough exam.  Visit your dentist for a routine exam and preventive care every 6 months. Brush your teeth twice a day and floss once a day. Good oral hygiene prevents tooth decay and gum disease.  The frequency of eye exams is based on your age, health, family medical history, use of contact lenses, and other factors. Follow your health care provider's recommendations for frequency of eye exams.  Eat a healthy diet. Foods like vegetables, fruits, whole grains, low-fat dairy products, and lean protein foods contain the nutrients you need without too many calories. Decrease your intake of foods high in solid fats, added sugars, and salt. Eat the right amount of calories for you.Get information about a proper diet from your health care provider, if necessary.  Regular physical exercise is one of the most important things you can do for your health. Most adults should get at least 150 minutes of moderate-intensity exercise (any activity that increases your heart rate and causes you to sweat) each week. In addition, most adults need muscle-strengthening exercises on 2 or more days a week.  Maintain a healthy weight. The body mass index (BMI) is a screening tool to identify possible weight problems. It provides an estimate of body fat based on height and weight. Your health care provider can find your BMI and can help you achieve or maintain a healthy weight.For adults 20 years and older:  A BMI below 18.5 is considered underweight.  A BMI of 18.5 to 24.9 is normal.  A BMI of 25 to 29.9 is considered overweight.  A  BMI of 30 and above is considered obese.  Maintain normal blood lipids and cholesterol levels by exercising and minimizing your intake of saturated fat. Eat a balanced diet with plenty of fruit and vegetables. Blood tests for lipids and cholesterol should begin at age 45 and be repeated every 5 years. If your lipid or cholesterol levels are high, you are over 50, or you are at high risk for heart disease, you may need your cholesterol levels checked more frequently.Ongoing high lipid and cholesterol levels should be treated with medicines if diet and exercise are not working.  If you smoke, find out from your health care provider how to quit. If you do not use tobacco, do not start.  Lung cancer screening is recommended for adults aged 45-80 years who are at high risk for developing lung cancer because of a history of smoking. A yearly low-dose CT scan of the lungs is recommended for people who have at least a 30-pack-year history of smoking and are a current smoker or have quit within the past 15 years. A pack year of smoking is smoking an average of 1 pack of cigarettes a day for 1 year (for example: 1 pack a day for 30 years or 2 packs a day for 15 years). Yearly screening should continue until the smoker has stopped smoking for at least 15 years. Yearly screening should be stopped for people who develop a health problem that would prevent them from having lung cancer treatment.  If you are pregnant, do not drink alcohol. If you are  breastfeeding, be very cautious about drinking alcohol. If you are not pregnant and choose to drink alcohol, do not have more than 1 drink per day. One drink is considered to be 12 ounces (355 mL) of beer, 5 ounces (148 mL) of wine, or 1.5 ounces (44 mL) of liquor.  Avoid use of street drugs. Do not share needles with anyone. Ask for help if you need support or instructions about stopping the use of drugs.  High blood pressure causes heart disease and increases the risk  of stroke. Your blood pressure should be checked at least every 1 to 2 years. Ongoing high blood pressure should be treated with medicines if weight loss and exercise do not work.  If you are 55-79 years old, ask your health care provider if you should take aspirin to prevent strokes.  Diabetes screening is done by taking a blood sample to check your blood glucose level after you have not eaten for a certain period of time (fasting). If you are not overweight and you do not have risk factors for diabetes, you should be screened once every 3 years starting at age 45. If you are overweight or obese and you are 40-70 years of age, you should be screened for diabetes every year as part of your cardiovascular risk assessment.  Breast cancer screening is essential preventive care for women. You should practice "breast self-awareness." This means understanding the normal appearance and feel of your breasts and may include breast self-examination. Any changes detected, no matter how small, should be reported to a health care provider. Women in their 20s and 30s should have a clinical breast exam (CBE) by a health care provider as part of a regular health exam every 1 to 3 years. After age 40, women should have a CBE every year. Starting at age 40, women should consider having a mammogram (breast X-ray test) every year. Women who have a family history of breast cancer should talk to their health care provider about genetic screening. Women at a high risk of breast cancer should talk to their health care providers about having an MRI and a mammogram every year.  Breast cancer gene (BRCA)-related cancer risk assessment is recommended for women who have family members with BRCA-related cancers. BRCA-related cancers include breast, ovarian, tubal, and peritoneal cancers. Having family members with these cancers may be associated with an increased risk for harmful changes (mutations) in the breast cancer genes BRCA1 and  BRCA2. Results of the assessment will determine the need for genetic counseling and BRCA1 and BRCA2 testing.  Your health care provider may recommend that you be screened regularly for cancer of the pelvic organs (ovaries, uterus, and vagina). This screening involves a pelvic examination, including checking for microscopic changes to the surface of your cervix (Pap test). You may be encouraged to have this screening done every 3 years, beginning at age 21.  For women ages 30-65, health care providers may recommend pelvic exams and Pap testing every 3 years, or they may recommend the Pap and pelvic exam, combined with testing for human papilloma virus (HPV), every 5 years. Some types of HPV increase your risk of cervical cancer. Testing for HPV may also be done on women of any age with unclear Pap test results.  Other health care providers may not recommend any screening for nonpregnant women who are considered low risk for pelvic cancer and who do not have symptoms. Ask your health care provider if a screening pelvic exam is right for   you.  If you have had past treatment for cervical cancer or a condition that could lead to cancer, you need Pap tests and screening for cancer for at least 20 years after your treatment. If Pap tests have been discontinued, your risk factors (such as having a new sexual partner) need to be reassessed to determine if screening should resume. Some women have medical problems that increase the chance of getting cervical cancer. In these cases, your health care provider may recommend more frequent screening and Pap tests.  Colorectal cancer can be detected and often prevented. Most routine colorectal cancer screening begins at the age of 50 years and continues through age 75 years. However, your health care provider may recommend screening at an earlier age if you have risk factors for colon cancer. On a yearly basis, your health care provider may provide home test kits to check  for hidden blood in the stool. Use of a small camera at the end of a tube, to directly examine the colon (sigmoidoscopy or colonoscopy), can detect the earliest forms of colorectal cancer. Talk to your health care provider about this at age 50, when routine screening begins. Direct exam of the colon should be repeated every 5-10 years through age 75 years, unless early forms of precancerous polyps or small growths are found.  People who are at an increased risk for hepatitis B should be screened for this virus. You are considered at high risk for hepatitis B if:  You were born in a country where hepatitis B occurs often. Talk with your health care provider about which countries are considered high risk.  Your parents were born in a high-risk country and you have not received a shot to protect against hepatitis B (hepatitis B vaccine).  You have HIV or AIDS.  You use needles to inject street drugs.  You live with, or have sex with, someone who has hepatitis B.  You get hemodialysis treatment.  You take certain medicines for conditions like cancer, organ transplantation, and autoimmune conditions.  Hepatitis C blood testing is recommended for all people born from 1945 through 1965 and any individual with known risks for hepatitis C.  Practice safe sex. Use condoms and avoid high-risk sexual practices to reduce the spread of sexually transmitted infections (STIs). STIs include gonorrhea, chlamydia, syphilis, trichomonas, herpes, HPV, and human immunodeficiency virus (HIV). Herpes, HIV, and HPV are viral illnesses that have no cure. They can result in disability, cancer, and death.  You should be screened for sexually transmitted illnesses (STIs) including gonorrhea and chlamydia if:  You are sexually active and are younger than 24 years.  You are older than 24 years and your health care provider tells you that you are at risk for this type of infection.  Your sexual activity has changed  since you were last screened and you are at an increased risk for chlamydia or gonorrhea. Ask your health care provider if you are at risk.  If you are at risk of being infected with HIV, it is recommended that you take a prescription medicine daily to prevent HIV infection. This is called preexposure prophylaxis (PrEP). You are considered at risk if:  You are sexually active and do not regularly use condoms or know the HIV status of your partner(s).  You take drugs by injection.  You are sexually active with a partner who has HIV.  Talk with your health care provider about whether you are at high risk of being infected with HIV. If   you choose to begin PrEP, you should first be tested for HIV. You should then be tested every 3 months for as long as you are taking PrEP.  Osteoporosis is a disease in which the bones lose minerals and strength with aging. This can result in serious bone fractures or breaks. The risk of osteoporosis can be identified using a bone density scan. Women ages 67 years and over and women at risk for fractures or osteoporosis should discuss screening with their health care providers. Ask your health care provider whether you should take a calcium supplement or vitamin D to reduce the rate of osteoporosis.  Menopause can be associated with physical symptoms and risks. Hormone replacement therapy is available to decrease symptoms and risks. You should talk to your health care provider about whether hormone replacement therapy is right for you.  Use sunscreen. Apply sunscreen liberally and repeatedly throughout the day. You should seek shade when your shadow is shorter than you. Protect yourself by wearing long sleeves, pants, a wide-brimmed hat, and sunglasses year round, whenever you are outdoors.  Once a month, do a whole body skin exam, using a mirror to look at the skin on your back. Tell your health care provider of new moles, moles that have irregular borders, moles that  are larger than a pencil eraser, or moles that have changed in shape or color.  Stay current with required vaccines (immunizations).  Influenza vaccine. All adults should be immunized every year.  Tetanus, diphtheria, and acellular pertussis (Td, Tdap) vaccine. Pregnant women should receive 1 dose of Tdap vaccine during each pregnancy. The dose should be obtained regardless of the length of time since the last dose. Immunization is preferred during the 27th-36th week of gestation. An adult who has not previously received Tdap or who does not know her vaccine status should receive 1 dose of Tdap. This initial dose should be followed by tetanus and diphtheria toxoids (Td) booster doses every 10 years. Adults with an unknown or incomplete history of completing a 3-dose immunization series with Td-containing vaccines should begin or complete a primary immunization series including a Tdap dose. Adults should receive a Td booster every 10 years.  Varicella vaccine. An adult without evidence of immunity to varicella should receive 2 doses or a second dose if she has previously received 1 dose. Pregnant females who do not have evidence of immunity should receive the first dose after pregnancy. This first dose should be obtained before leaving the health care facility. The second dose should be obtained 4-8 weeks after the first dose.  Human papillomavirus (HPV) vaccine. Females aged 13-26 years who have not received the vaccine previously should obtain the 3-dose series. The vaccine is not recommended for use in pregnant females. However, pregnancy testing is not needed before receiving a dose. If a female is found to be pregnant after receiving a dose, no treatment is needed. In that case, the remaining doses should be delayed until after the pregnancy. Immunization is recommended for any person with an immunocompromised condition through the age of 61 years if she did not get any or all doses earlier. During the  3-dose series, the second dose should be obtained 4-8 weeks after the first dose. The third dose should be obtained 24 weeks after the first dose and 16 weeks after the second dose.  Zoster vaccine. One dose is recommended for adults aged 30 years or older unless certain conditions are present.  Measles, mumps, and rubella (MMR) vaccine. Adults born  before 1957 generally are considered immune to measles and mumps. Adults born in 1957 or later should have 1 or more doses of MMR vaccine unless there is a contraindication to the vaccine or there is laboratory evidence of immunity to each of the three diseases. A routine second dose of MMR vaccine should be obtained at least 28 days after the first dose for students attending postsecondary schools, health care workers, or international travelers. People who received inactivated measles vaccine or an unknown type of measles vaccine during 1963-1967 should receive 2 doses of MMR vaccine. People who received inactivated mumps vaccine or an unknown type of mumps vaccine before 1979 and are at high risk for mumps infection should consider immunization with 2 doses of MMR vaccine. For females of childbearing age, rubella immunity should be determined. If there is no evidence of immunity, females who are not pregnant should be vaccinated. If there is no evidence of immunity, females who are pregnant should delay immunization until after pregnancy. Unvaccinated health care workers born before 1957 who lack laboratory evidence of measles, mumps, or rubella immunity or laboratory confirmation of disease should consider measles and mumps immunization with 2 doses of MMR vaccine or rubella immunization with 1 dose of MMR vaccine.  Pneumococcal 13-valent conjugate (PCV13) vaccine. When indicated, a person who is uncertain of his immunization history and has no record of immunization should receive the PCV13 vaccine. All adults 65 years of age and older should receive this  vaccine. An adult aged 19 years or older who has certain medical conditions and has not been previously immunized should receive 1 dose of PCV13 vaccine. This PCV13 should be followed with a dose of pneumococcal polysaccharide (PPSV23) vaccine. Adults who are at high risk for pneumococcal disease should obtain the PPSV23 vaccine at least 8 weeks after the dose of PCV13 vaccine. Adults older than 78 years of age who have normal immune system function should obtain the PPSV23 vaccine dose at least 1 year after the dose of PCV13 vaccine.  Pneumococcal polysaccharide (PPSV23) vaccine. When PCV13 is also indicated, PCV13 should be obtained first. All adults aged 65 years and older should be immunized. An adult younger than age 65 years who has certain medical conditions should be immunized. Any person who resides in a nursing home or long-term care facility should be immunized. An adult smoker should be immunized. People with an immunocompromised condition and certain other conditions should receive both PCV13 and PPSV23 vaccines. People with human immunodeficiency virus (HIV) infection should be immunized as soon as possible after diagnosis. Immunization during chemotherapy or radiation therapy should be avoided. Routine use of PPSV23 vaccine is not recommended for American Indians, [place] Natives, or people younger than 65 years unless there are medical conditions that require PPSV23 vaccine. When indicated, people who have unknown immunization and have no record of immunization should receive PPSV23 vaccine. One-time revaccination 5 years after the first dose of PPSV23 is recommended for people aged 19-64 years who have chronic kidney failure, nephrotic syndrome, asplenia, or immunocompromised conditions. People who received 1-2 doses of PPSV23 before age 65 years should receive another dose of PPSV23 vaccine at age 65 years or later if at least 5 years have passed since the previous dose. Doses of PPSV23 are not  needed for people immunized with PPSV23 at or after age 65 years.  Meningococcal vaccine. Adults with asplenia or persistent complement component deficiencies should receive 2 doses of quadrivalent meningococcal conjugate (MenACWY-D) vaccine. The doses should be obtained   at least 2 months apart. Microbiologists working with certain meningococcal bacteria, [place] recruits, people at risk during an outbreak, and people who travel to or live in countries with a high rate of meningitis should be immunized. A first-year college student up through age 34 years who is living in a residence hall should receive a dose if she did not receive a dose on or after her 16th birthday. Adults who have certain high-risk conditions should receive one or more doses of vaccine.  Hepatitis A vaccine. Adults who wish to be protected from this disease, have certain high-risk conditions, work with hepatitis A-infected animals, work in hepatitis A research labs, or travel to or work in countries with a high rate of hepatitis A should be immunized. Adults who were previously unvaccinated and who anticipate close contact with an international adoptee during the first 60 days after arrival in the [place] from a country with a high rate of hepatitis A should be immunized.  Hepatitis B vaccine. Adults who wish to be protected from this disease, have certain high-risk conditions, may be exposed to blood or other infectious body fluids, are household contacts or sex partners of hepatitis B positive people, are clients or workers in certain care facilities, or travel to or work in countries with a high rate of hepatitis B should be immunized.  Haemophilus influenzae type b (Hib) vaccine. A previously unvaccinated person with asplenia or sickle cell disease or having a scheduled splenectomy should receive 1 dose of Hib vaccine. Regardless of previous immunization, a recipient of a hematopoietic stem cell transplant should receive a  3-dose series 6-12 months after her successful transplant. Hib vaccine is not recommended for adults with HIV infection. Preventive Services / Frequency Ages 35 to 4 years  Blood pressure check.** / Every 3-5 years.  Lipid and cholesterol check.** / Every 5 years beginning at age 60.  Clinical breast exam.** / Every 3 years for women in their 71s and 10s.  BRCA-related cancer risk assessment.** / For women who have family members with a BRCA-related cancer (breast, ovarian, tubal, or peritoneal cancers).  Pap test.** / Every 2 years from ages 76 through 26. Every 3 years starting at age 61 through age 76 or 93 with a history of 3 consecutive normal Pap tests.  HPV screening.** / Every 3 years from ages 37 through ages 60 to 51 with a history of 3 consecutive normal Pap tests.  Hepatitis C blood test.** / For any individual with known risks for hepatitis C.  Skin self-exam. / Monthly.  Influenza vaccine. / Every year.  Tetanus, diphtheria, and acellular pertussis (Tdap, Td) vaccine.** / Consult your health care provider. Pregnant women should receive 1 dose of Tdap vaccine during each pregnancy. 1 dose of Td every 10 years.  Varicella vaccine.** / Consult your health care provider. Pregnant females who do not have evidence of immunity should receive the first dose after pregnancy.  HPV vaccine. / 3 doses over 6 months, if 93 and younger. The vaccine is not recommended for use in pregnant females. However, pregnancy testing is not needed before receiving a dose.  Measles, mumps, rubella (MMR) vaccine.** / You need at least 1 dose of MMR if you were born in 1957 or later. You may also need a 2nd dose. For females of childbearing age, rubella immunity should be determined. If there is no evidence of immunity, females who are not pregnant should be vaccinated. If there is no evidence of immunity, females who are  pregnant should delay immunization until after pregnancy.  Pneumococcal  13-valent conjugate (PCV13) vaccine.** / Consult your health care provider.  Pneumococcal polysaccharide (PPSV23) vaccine.** / 1 to 2 doses if you smoke cigarettes or if you have certain conditions.  Meningococcal vaccine.** / 1 dose if you are age 68 to 8 years and a Market researcher living in a residence hall, or have one of several medical conditions, you need to get vaccinated against meningococcal disease. You may also need additional booster doses.  Hepatitis A vaccine.** / Consult your health care provider.  Hepatitis B vaccine.** / Consult your health care provider.  Haemophilus influenzae type b (Hib) vaccine.** / Consult your health care provider. Ages 7 to 53 years  Blood pressure check.** / Every year.  Lipid and cholesterol check.** / Every 5 years beginning at age 25 years.  Lung cancer screening. / Every year if you are aged 11-80 years and have a 30-pack-year history of smoking and currently smoke or have quit within the past 15 years. Yearly screening is stopped once you have quit smoking for at least 15 years or develop a health problem that would prevent you from having lung cancer treatment.  Clinical breast exam.** / Every year after age 48 years.  BRCA-related cancer risk assessment.** / For women who have family members with a BRCA-related cancer (breast, ovarian, tubal, or peritoneal cancers).  Mammogram.** / Every year beginning at age 41 years and continuing for as long as you are in good health. Consult with your health care provider.  Pap test.** / Every 3 years starting at age 65 years through age 37 or 70 years with a history of 3 consecutive normal Pap tests.  HPV screening.** / Every 3 years from ages 72 years through ages 60 to 40 years with a history of 3 consecutive normal Pap tests.  Fecal occult blood test (FOBT) of stool. / Every year beginning at age 21 years and continuing until age 5 years. You may not need to do this test if you get  a colonoscopy every 10 years.  Flexible sigmoidoscopy or colonoscopy.** / Every 5 years for a flexible sigmoidoscopy or every 10 years for a colonoscopy beginning at age 35 years and continuing until age 48 years.  Hepatitis C blood test.** / For all people born from [birth year] through 1965 and any individual with known risks for hepatitis C.  Skin self-exam. / Monthly.  Influenza vaccine. / Every year.  Tetanus, diphtheria, and acellular pertussis (Tdap/Td) vaccine.** / Consult your health care provider. Pregnant women should receive 1 dose of Tdap vaccine during each pregnancy. 1 dose of Td every 10 years.  Varicella vaccine.** / Consult your health care provider. Pregnant females who do not have evidence of immunity should receive the first dose after pregnancy.  Zoster vaccine.** / 1 dose for adults aged 30 years or older.  Measles, mumps, rubella (MMR) vaccine.** / You need at least 1 dose of MMR if you were born in 1957 or later. You may also need a second dose. For females of childbearing age, rubella immunity should be determined. If there is no evidence of immunity, females who are not pregnant should be vaccinated. If there is no evidence of immunity, females who are pregnant should delay immunization until after pregnancy.  Pneumococcal 13-valent conjugate (PCV13) vaccine.** / Consult your health care provider.  Pneumococcal polysaccharide (PPSV23) vaccine.** / 1 to 2 doses if you smoke cigarettes or if you have certain conditions.  Meningococcal vaccine.** /  Consult your health care provider.  Hepatitis A vaccine.** / Consult your health care provider.  Hepatitis B vaccine.** / Consult your health care provider.  Haemophilus influenzae type b (Hib) vaccine.** / Consult your health care provider. Ages 64 years and over  Blood pressure check.** / Every year.  Lipid and cholesterol check.** / Every 5 years beginning at age 23 years.  Lung cancer screening. / Every year if you  are aged 16-80 years and have a 30-pack-year history of smoking and currently smoke or have quit within the past 15 years. Yearly screening is stopped once you have quit smoking for at least 15 years or develop a health problem that would prevent you from having lung cancer treatment.  Clinical breast exam.** / Every year after age 74 years.  BRCA-related cancer risk assessment.** / For women who have family members with a BRCA-related cancer (breast, ovarian, tubal, or peritoneal cancers).  Mammogram.** / Every year beginning at age 44 years and continuing for as long as you are in good health. Consult with your health care provider.  Pap test.** / Every 3 years starting at age 58 years through age 22 or 39 years with 3 consecutive normal Pap tests. Testing can be stopped between 65 and 70 years with 3 consecutive normal Pap tests and no abnormal Pap or HPV tests in the past 10 years.  HPV screening.** / Every 3 years from ages 64 years through ages 70 or 61 years with a history of 3 consecutive normal Pap tests. Testing can be stopped between 65 and 70 years with 3 consecutive normal Pap tests and no abnormal Pap or HPV tests in the past 10 years.  Fecal occult blood test (FOBT) of stool. / Every year beginning at age 40 years and continuing until age 27 years. You may not need to do this test if you get a colonoscopy every 10 years.  Flexible sigmoidoscopy or colonoscopy.** / Every 5 years for a flexible sigmoidoscopy or every 10 years for a colonoscopy beginning at age 7 years and continuing until age 32 years.  Hepatitis C blood test.** / For all people born from [birth year] through 1963 and any individual with known risks for hepatitis C.  Osteoporosis screening.** / A one-time screening for women ages 30 years and over and women at risk for fractures or osteoporosis.  Skin self-exam. / Monthly.  Influenza vaccine. / Every year.  Tetanus, diphtheria, and acellular pertussis (Tdap/Td)  vaccine.** / 1 dose of Td every 10 years.  Varicella vaccine.** / Consult your health care provider.  Zoster vaccine.** / 1 dose for adults aged 35 years or older.  Pneumococcal 13-valent conjugate (PCV13) vaccine.** / Consult your health care provider.  Pneumococcal polysaccharide (PPSV23) vaccine.** / 1 dose for all adults aged 46 years and older.  Meningococcal vaccine.** / Consult your health care provider.  Hepatitis A vaccine.** / Consult your health care provider.  Hepatitis B vaccine.** / Consult your health care provider.  Haemophilus influenzae type b (Hib) vaccine.** / Consult your health care provider. ** Family history and personal history of risk and conditions may change your health care provider's recommendations.   This information is not intended to replace advice given to you by your health care provider. Make sure you discuss any questions you have with your health care provider.   Document Released: 01/24/2002 Document Revised: 12/19/2014 Document Reviewed: 04/25/2011 Elsevier Interactive Patient Education [employer].

## 2015-11-10 NOTE — Progress Notes (Signed)
Subjective:  Patient ID: Katelyn Sparks, female    DOB: June 28, 1935  Age: 80 y.o. MRN: [id number]  CC: Annual Exam   HPI ONEIDA MCKAMEY presents for a CPX - she complains of intermittent nausea since starting Aricept and Namenda, it does not affect her appetite and she has not lost weight. Her son believes that the combo of meds has prevented her dementia from worsening over the last year and they would like for her to continue taking it.  Outpatient Prescriptions Prior to Visit  Medication Sig Dispense Refill  . atorvastatin (LIPITOR) 80 MG tablet TAKE 1 TABLET BY MOUTH DAILY 90 tablet 3  . donepezil (ARICEPT) 10 MG tablet Take 1 tablet (10 mg total) by mouth at bedtime. 90 tablet 3  . Melatonin 10 MG TABS Take by mouth.    . Memantine HCl ER (NAMENDA XR) 28 MG CP24 Take 28 mg by mouth daily. 90 capsule 3  . Omega-3 Fatty Acids (FISH OIL) 1000 MG CAPS Take by mouth.    Marland Kitchen omeprazole (PRILOSEC) 40 MG capsule TK 1 C PO D  3   No facility-administered medications prior to visit.    ROS Review of Systems  Constitutional: Negative.  Negative for fever, chills, diaphoresis, appetite change and fatigue.  HENT: Negative.  Negative for sore throat, trouble swallowing and voice change.   Eyes: Negative.   Respiratory: Negative.  Negative for cough, choking, chest tightness, shortness of breath and stridor.   Cardiovascular: Negative.  Negative for chest pain, palpitations and leg swelling.  Gastrointestinal: Positive for nausea and constipation. Negative for vomiting, abdominal pain, diarrhea, blood in stool, abdominal distention, anal bleeding and rectal pain.  Endocrine: Negative.   Genitourinary: Negative.   Musculoskeletal: Negative.  Negative for myalgias, back pain, joint swelling and arthralgias.  Skin: Negative.  Negative for color change and rash.  Allergic/Immunologic: Negative.   Neurological: Negative.  Negative for dizziness, tremors, syncope, light-headedness and numbness.    Hematological: Negative.  Negative for adenopathy. Does not bruise/bleed easily.  Psychiatric/Behavioral: Positive for confusion and decreased concentration.    Objective:  BP 120/68 mmHg  Pulse 66  Temp(Src) 98.1 F (36.7 C) (Oral)  Resp 16  Ht  (1.676 m)  Wt 134 lb (60.782 kg)  BMI 21.64 kg/m2  SpO2 96%  BP Readings from Last 3 Encounters:  11/10/15 120/68  09/07/15 114/62  07/29/15 102/45    Wt Readings from Last 3 Encounters:  11/10/15 134 lb (60.782 kg)  09/07/15 129 lb (58.514 kg)  07/29/15 127 lb (57.607 kg)    Physical Exam  Constitutional: She is oriented to person, place, and time. No distress.  HENT:  Mouth/Throat: Oropharynx is clear and moist. No oropharyngeal exudate.  Eyes: Conjunctivae are normal. Right eye exhibits no discharge. Left eye exhibits no discharge. No scleral icterus.  Neck: Normal range of motion. Neck supple. No JVD present. No tracheal deviation present. No thyromegaly present.  Cardiovascular: Normal rate, regular rhythm, S1 normal, S2 normal and intact distal pulses.  Exam reveals no gallop, no distant heart sounds and no friction rub.   Murmur heard.  Systolic murmur is present with a grade of 1/6   No diastolic murmur is present  Pulmonary/Chest: Effort normal and breath sounds normal. No stridor. No respiratory distress. She has no wheezes. She has no rales. She exhibits no tenderness.  Abdominal: Soft. Bowel sounds are normal. She exhibits no distension and no mass. There is no tenderness. There is no rebound and  no guarding.  Musculoskeletal: Normal range of motion. She exhibits no edema or tenderness.  Lymphadenopathy:    She has no cervical adenopathy.  Neurological: She is oriented to person, place, and time.  Skin: Skin is warm and dry. No rash noted. She is not diaphoretic. No erythema. No pallor.  Psychiatric: She has a normal mood and affect. Judgment and thought content normal. Her speech is delayed. She is slowed and  withdrawn. Cognition and memory are impaired. She exhibits abnormal recent memory and abnormal remote memory. She is inattentive.  Vitals reviewed.   Lab Results  Component Value Date   WBC 5.2 11/10/2015   HGB 13.0 11/10/2015   HCT 37.7 11/10/2015   PLT 194.0 11/10/2015   GLUCOSE 101* 11/10/2015   CHOL 173 11/10/2015   TRIG 81.0 11/10/2015   HDL 69.50 11/10/2015   LDLDIRECT 172.6 08/28/2013   LDLCALC 88 11/10/2015   ALT 15 11/10/2015   AST 18 11/10/2015   NA 142 11/10/2015   K 3.4* 11/10/2015   CL 106 11/10/2015   CREATININE 0.78 11/10/2015   BUN 20 11/10/2015   CO2 29 11/10/2015   TSH 1.97 11/10/2015   HGBA1C 6.0 11/10/2015    Mr Brain Wo Contrast  01/09/2014  [facility] NEUROIMAGING REPORT STUDY DATE: 01/08/14 PATIENT NAME: Katelyn Sparks DOB: 1935/02/16 MRN: [id number] ORDERING CLINICIAN: Joycelyn Schmid, MD CLINICAL HISTORY: 80 year old female with dementia. EXAM: MRI brain (without) TECHNIQUE: MRI of the brain without contrast was obtained utilizing 5 mm axial slices with T1, T2, T2 flair, SWI and diffusion weighted views.  T1 sagittal and T2 coronal views were obtained. CONTRAST: no IMAGING SITE: [facility] [address] (1.5 Tesla MRI)  FINDINGS: No abnormal lesions are seen on diffusion-weighted views to suggest acute ischemia. The cortical sulci, fissures and cisterns are normal in size and appearance. Lateral, third and fourth ventricle are normal in size and appearance. No extra-axial fluid collections are seen. No evidence of mass effect or midline shift. Moderate periventricular and subcortical chronic small vessel ischemic disease. On sagittal views the posterior fossa, pituitary gland and corpus callosum are unremarkable. Left parietal convexity chronic cerebral microhemorrhage. The orbits and their contents, paranasal sinuses and calvarium are notable for post-surgical orbits.  Intracranial flow voids are present.   01/09/2014  Abnormal  MRI brain (without) demonstrating: 1. Moderate periventricular and subcortical chronic small vessel ischemic disease. 2. Punctate left parietal convexity chronic cerebral microhemorrhage. INTERPRETING PHYSICIAN: Suanne Marker, MD Certified in Neurology, Neurophysiology and Neuroimaging [facility] [address] [place], [place] [address] [phone]    Assessment & Plan:   Yanette was seen today for annual exam.  Diagnoses and all orders for this visit:  Hyperglycemia- she has prediabetes, no meds are needed, will cont to monitor this -     Hemoglobin A1c; Future -     Comprehensive metabolic panel; Future  Hyperlipidemia with target LDL less than 130- she has achieved her LDL goal and is doing well on the statin -     Lipid panel; Future -     TSH; Future -     CBC with Differential/Platelet; Future  Encounter for immunization  Dementia arising in the senium and presenium- will cont the current combination of Aricept and Namenda, she also wants to see neurology to see if there are similar options to treat dementia that don't cause nausea -     Ambulatory referral to Neurology  Chronic constipation- her labs  do not show any secondary causes of constipation, will start Linzess for symptoms relief -     Linaclotide (LINZESS) 145 MCG CAPS capsule; Take 1 capsule (145 mcg total) by mouth daily.  Other orders -     Flu Vaccine QUAD 36+ mos IM  I am having Ms. Semple start on Linaclotide. I am also having her maintain her donepezil, memantine, atorvastatin, omeprazole, Fish Oil, Melatonin, and traZODone.  Meds ordered this encounter  Medications  . traZODone (DESYREL) 50 MG tablet    Sig:     Refill:  3  . Linaclotide (LINZESS) 145 MCG CAPS capsule    Sig: Take 1 capsule (145 mcg total) by mouth daily.    Dispense:  90 capsule    Refill:  3     Follow-up: Return if symptoms worsen or fail to improve.  Sanda Lingerhomas Tiffancy Moger, MD

## 2015-11-11 ENCOUNTER — Encounter: Payer: Self-pay | Admitting: Internal Medicine

## 2016-01-11 ENCOUNTER — Encounter: Payer: Self-pay | Admitting: Neurology

## 2016-01-11 ENCOUNTER — Ambulatory Visit (INDEPENDENT_AMBULATORY_CARE_PROVIDER_SITE_OTHER): Payer: Medicare Other | Admitting: Neurology

## 2016-01-11 VITALS — BP 118/70 | HR 68 | Resp 16 | Ht 66.0 in | Wt 131.0 lb

## 2016-01-11 DIAGNOSIS — R110 Nausea: Secondary | ICD-10-CM

## 2016-01-11 DIAGNOSIS — G301 Alzheimer's disease with late onset: Secondary | ICD-10-CM | POA: Diagnosis not present

## 2016-01-11 DIAGNOSIS — F0280 Dementia in other diseases classified elsewhere without behavioral disturbance: Secondary | ICD-10-CM | POA: Diagnosis not present

## 2016-01-11 NOTE — Progress Notes (Signed)
Subjective:    Patient ID: KRYSTYNE TEWKSBURY is a 81 y.o. female.  HPI     Interim history:   Ms. Trieu is a very pleasant 81 year old right-handed woman with an underlying medical history of diabetes, reflux disease, and left shoulder pain, who presents for followup consultation of her memory loss. The patient is accompanied by her son, Dallas Breeding (he lives locally). I last saw her on 09/07/2015, at which time she reported she was doing fine. Her other son, Lennette Bihari, lives in [place]. She had a recent trip to the beach. She was no longer on long-acting Namenda for unclear reasons. She was on donepezil. She was taking melatonin at night. Lennette Bihari, who lives in [place], would try to talk to her on the phone almost on a daily basis. Her MMSE at the time was 24, clock drawing was 4-4, animal fluency was 12/m.  Today, 01/11/2016: She reports more nausea, not daily, but nearly daily, no vomiting, no particular triggers or time of day.  Memory-wise, she is fairly stable. She does not drink sodas. She tries to drink enough water. She drinks usually 1 ensure per day. She tries to exercise in the form of walking, sometimes she goes to the gym that is close to home.  Previously:   I saw her on 05/18/2015, at which time she reported problems with insomnia. She had chronic constipation. Trazodone was not helpful. She was still driving, but locally only. She was trying to drink enough water. Appetite was reportedly good. I suggested she continue with the dual memory medications. For sleep, I suggested she try over-the-counter melatonin. For constipation, she was advised to increase her natural fiber intake and use an over-the-counter laxative as needed. She was seen by GI in the interim on 06/18/15, I reviewed the note. She had a colonoscopy on 07/29/2015. She was diagnosed with hemorrhoids. She was found to have mild diverticulosis.   I saw her on 03/02/2015, at which time she reported going to the gym once or twice a week. She  was not completely sure about her memory, medications. She was unaccompanied at the time. She was drinking about 4 glasses of water per day and appetite was okay. Her grandson was living with her. She reported no recent falls and felt her memory is stable. Her MMSE was 22 at the time, and she was advised to continue with Aricept 10 mg and Namenda long-acting 28 mg once daily.  I saw her on 05/28/2014, at which time her son felt that she had stabilized. She was on Aricept 5 mg. I increased this to 10 mg. She was supposed be on Namenda long-acting 28 mg once daily. She still lived with her 21 year old grandson. She was driving some. In the interim, she was seen by our nurse practitioner, [name], on 11/03/2014, at which time her MMSE was 22. She was complaining of constipation. She had been prescribed Belsomra for insomnia but her primary care physician. She was advised to continue with Aricept at 10 mg and Namenda long-acting at 28 mg once daily.   I saw her on 01/13/14, at which time I started her on Aricept 5 mg. In the interim, her PCP started her on Namenda long-acting. She was supposed to be on 28 mg once daily but her son was not completely sure if she was on it. The patient is not able to tell and her other son did not now either.   I first met her on 09/13/2013 at which time she was  accompanied by her friend [name]. The patient reported a history of memory problems for about one year including forgetfulness, misplacing things, easy frustration. I suggested further workup in the form of neuropsychological testing as well as MRI brain. She had a brain MRI without contrast on 01/09/2014: Abnormal MRI brain (without) demonstrating: 1. Moderate periventricular and subcortical chronic small vessel ischemic disease. 2. Punctate left parietal convexity chronic cerebral microhemorrhage. In addition, personally reviewed the images through the PACS system and explained the findings to the patient and her son.    Regarding her cognitive testing, received a message from [name] from Dr. Monico Hoar office on 12/10/13: "Ms. Minchew dis not show for her appointment on 12/09/13 for neuropsychological evaluation. When contacted, she seemed confused and claimed no recollection of having made the appointment. She agreed to come in later the same morning but again did not show. Called her. She stated she went to the wrong place. She re-scheduled for 12/16/13. She called on 12/10/13 to cancel that appointment and did not wish to re-schedule."   She lives in her own home, is divorced and her 79 yo GS lives with her. This causes her stress at times. She drives, and has not gotten lost driving. She has no VH or AH, no SI/HI, no paranoid delusions, no Hx of inappropriate behavior. Her father had dementia and died at the age of 25. He mother lived to be 44 and had no dementia. She had a total of 13 siblings, 3 died in childhood. No siblings with memory loss.   The patient denies prior TIA or stroke symptoms, such as sudden onset of one sided weakness, numbness, tingling, slurring of speech or droopy face, hearing loss, tinnitus, diplopia or visual field cut or monocular loss of vision, and denies recurrent headaches. No falls, no head injury, no snoring was reported. 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   Blood work from 08/28/13: N CBC, N CMP and HbA1c of 6.6 and elevated cholesterol at 262 and LDL of 172.    Her Past Medical History Is Significant For: Past Medical History  Diagnosis Date  . Osteoarthrosis, unspecified whether generalized or localized, unspecified site   . Unspecified hemorrhoids without mention of complication   . Flatulence, eructation, and gas pain   . Esophageal reflux   . Blood in stool   . Dementia     Her Past Surgical History Is Significant For: Past Surgical History  Procedure Laterality Date  . Abdominal hysterectomy    . Colonoscopy  11/2010  . Upper gi endoscopy   11/2010    Her Family History Is Significant For: Family History  Problem Relation Age of Onset  . Diabetes    . Hypertension Mother   . Colon cancer Neg Hx   . Cancer Neg Hx   . Early death Neg Hx   . Heart disease Neg Hx   . Hyperlipidemia Neg Hx   . Kidney disease Neg Hx   . Learning disabilities Neg Hx   . Stroke Neg Hx   . Diabetes Mother   . Diabetes Sister   . Diabetes Brother     Her Social History Is Significant For: Social History   Social History  . Marital Status: Widowed    Spouse Name: N/A  . Number of Children: 2  . Years of Education: 12   Occupational History  . Retired    Social History Main Topics  . Smoking status: Never Smoker   .  Smokeless tobacco: Never Used  . Alcohol Use: No  . Drug Use: No  . Sexual Activity: Not Currently   Other Topics Concern  . None   Social History Narrative   She is a retired from a [employer].   Her husband passed away in 02/24/2000.  She has two son.   Patient is right handed.   Her grandson lives with her.   Patient drinks 1 cup of caffeine daily    Her Allergies Are:  No Known Allergies:   Her Current Medications Are:  Outpatient Encounter Prescriptions as of 01/11/2016  Medication Sig  . atorvastatin (LIPITOR) 80 MG tablet TAKE 1 TABLET BY MOUTH DAILY  . donepezil (ARICEPT) 10 MG tablet Take 1 tablet (10 mg total) by mouth at bedtime.  . Linaclotide (LINZESS) 145 MCG CAPS capsule Take 1 capsule (145 mcg total) by mouth daily.  . Melatonin 10 MG TABS Take by mouth.  . Memantine HCl ER (NAMENDA XR) 28 MG CP24 Take 28 mg by mouth daily.  . Omega-3 Fatty Acids (FISH OIL) 1000 MG CAPS Take by mouth.  Marland Kitchen omeprazole (PRILOSEC) 40 MG capsule TK 1 C PO D  . traZODone (DESYREL) 50 MG tablet    No facility-administered encounter medications on file as of 01/11/2016.  :  Review of Systems:  Out of a complete 14 point review of systems, all are reviewed and negative with the exception of these symptoms as  listed below:   Review of Systems  Neurological:       Patient is here for f/u. Son states that patient's PCP recommends lowering one of her memory medications due to constant nausea (unsure which med). Patient reports that she has trouble staying asleep.     Objective:  Neurologic Exam  Physical Exam Physical Examination:   Filed Vitals:   01/11/16 0933  BP: 118/70  Pulse: 68  Resp: 16   General Examination: The patient is a very pleasant 81 y.o. female in no acute distress. She is calm and cooperative with the exam. She is well groomed and situated in a chair.   HEENT: Normocephalic, atraumatic, pupils are equal, round and reactive to light and accommodation. Extraocular tracking shows mild saccadic breakdown without nystagmus noted. Hearing is impaired mildly. Face is symmetric with no facial masking and normal facial sensation. There is no lip, neck or jaw tremor. Neck is mildly rigid with intact passive ROM. There are no carotid bruits on auscultation. Oropharynx exam reveals mild to moderate mouth dryness. Moderate airway crowding is noted. Mallampati is class III. Tongue protrudes centrally and palate elevates symmetrically.    Chest: is clear to auscultation without wheezing, rhonchi or crackles noted.  Heart: sounds are regular and normal without murmurs, rubs or gallops noted.   Abdomen: is soft, non-tender and non-distended with normal bowel sounds appreciated on auscultation.  Extremities: There is no pitting edema in the distal lower extremities bilaterally. Pedal pulses are intact.   Skin: is warm and dry with no trophic changes noted. Age-related changes are noted on the skin.   Musculoskeletal: exam reveals no obvious joint deformities, tenderness or joint swelling or erythema.   Neurologically:   Mental status: The patient is awake and alert, paying good  attention. She is able to partially provide some of her history. She is oriented to: person, place,  situation, day of week and year. Her memory, attention, language and knowledge are impaired. There is no aphasia, agnosia, apraxia or anomia. There is a mild degree  of bradyphrenia. Speech is mildly hypophonic with no dysarthria noted. Mood is congruent and affect is normal.   On 09/13/13: Her MMSE (Mini-Mental state exam) score was 21/30, CDT (Clock Drawing Test) score was 4/4 and AFT (Animal Fluency Test) score was 10/min.  On 05/28/14: Her MMSE: 20/30, CDT: 4/4, AFT: 9/min.  On 03/02/2015: MMSE: 22/30, CDT: 3/4, AFT: 11/min, GDS: 0/15.   On 09/07/2015: MMSE: 24/30, CDT: 4/4, AFT: 12/min.   On 01/11/2016: 24/30, CDT: 4/4, AFT: 9/min.  Cranial nerves are as described above under HEENT exam. In addition, shoulder shrug is normal with equal shoulder height noted.  Motor exam: Normal bulk, and strength for age is noted. Tone is not rigid with absence of cogwheeling in the extremities. There is overall no significant bradykinesia. There is no drift or rebound. There is no tremor.   Romberg is negative. Reflexes are 1+ in the upper extremities and 1+ in the lower extremities. Fine motor skills: Finger taps, hand movements, and rapid alternating patting are not impaired bilaterally. Foot taps and foot agility are not impaired bilaterally.   Cerebellar testing shows no dysmetria or intention tremor on finger to nose testing. There is no truncal or gait ataxia.   Sensory exam is intact to light touch in the upper and lower extremities.   Gait, station and balance: She stands up from the seated position with no difficulty and needs no assistance. No veering to one side is noted. No leaning to one side. Posture is mildly stooped, age-appropriate. Stance is narrow-based. She turns en bloc. Tandem walk is difficult for her, but better than last time. Her balance is mildly impaired.   Assessment and Plan:   In summary, AIRIS BARBEE is a very pleasant 81 year old female with an underlying medical history of  HLP, reflux disease, and left shoulder pain, who presents for follow-up consultation of her memory loss of about 3 years' duration. Her history and physical exam are keeping with  Alzheimer's dementia versus mixed dementia. She has no overt behavioral disturbance but her sons have noted some mood irritability in the past. We will continue to monitor. She has been on generic Aricept 10 mg once daily and long-acting Namenda 28 mg once daily. She has been complaining of nausea. While it is possible that both Aricept and Namenda can cause nausea, I have seen it more often with Aricept. Therefore, I suggested we cut the Aricept in half to 5 mg once daily. I wrote this down in her instructions. She is advised to monitor her nausea. Her son is encouraged to give [place] a call for an update in about 2-3 weeks. It may take 4 or 5 days for her to notice a difference I explained to them. I suggested a 3 month follow-up with one of our nurse practitioners in particular to discuss ongoing nausea and ongoing Aricept at 5 mg generic daily. I will see her back after that. I answered all the questions today and they were in agreement.  I spent 20 minutes in total face-to-face time with the patient, more than 50% of which was spent in counseling and coordination of care, reviewing test results, reviewing medication and discussing or reviewing the diagnosis of dementia, its prognosis and treatment options.

## 2016-01-11 NOTE — Patient Instructions (Signed)
We will reduce your donepezil to 5 mg for your complaint nausea. Break the 10 mg pill in half.  We will keep the Namenda XR at 28 mg once daily.  We will do a 3 month follow up with our nurse practitioner and also call for update as far the nausea in about 2-3 weeks (Cedric) can call.

## 2016-04-11 ENCOUNTER — Ambulatory Visit (INDEPENDENT_AMBULATORY_CARE_PROVIDER_SITE_OTHER): Payer: Medicare Other | Admitting: Nurse Practitioner

## 2016-04-11 ENCOUNTER — Encounter: Payer: Self-pay | Admitting: Nurse Practitioner

## 2016-04-11 VITALS — BP 120/73 | HR 64 | Ht 66.0 in | Wt 140.8 lb

## 2016-04-11 DIAGNOSIS — F068 Other specified mental disorders due to known physiological condition: Secondary | ICD-10-CM | POA: Diagnosis not present

## 2016-04-11 DIAGNOSIS — F0390 Unspecified dementia without behavioral disturbance: Secondary | ICD-10-CM

## 2016-04-11 MED ORDER — DONEPEZIL HCL 5 MG PO TABS: 5.0000 mg | ORAL_TABLET | Freq: Every day | ORAL | Status: DC

## 2016-04-11 NOTE — Patient Instructions (Signed)
Continue Aricept 5 mg daily will call in Rx Continue Namenda XR 28 mg daily Follow-up in 6 months

## 2016-04-11 NOTE — Progress Notes (Addendum)
[facility]  PATIENT: Katelyn Sparks DOB: [birth year]   REASON FOR VISIT: Follow-up for Alzheimer's dementia HISTORY FROM: Patient and son   HISTORY OF PRESENT ILLNESS:UPDATE 04/11/2016 Mr. Katelyn Sparks, 81 year old female returns for follow-up for memory loss with her son Katelyn Sparks who Lives in [place]. When last seen by Dr. Frances Furbish, her Aricept was reduced to 5 mg daily due to nausea. She remains on Namenda 28 mg daily. This is prescribed by Dr. Yetta Barre. According to Katelyn Sparks her nausea has subsided and she has actually gained about 9 pounds since last seen. She continues to walk for exercise. She sometimes goes to the gym close to her home. She returns for reevaluation   HISTORY [name] is a very pleasant 81 year old right-handed woman with an underlying medical history of diabetes, reflux disease, and left shoulder pain, who presents for followup consultation of her memory loss. The patient is accompanied by her son, Katelyn Sparks (he lives locally). I last saw her on 09/07/2015, at which time she reported she was doing fine. Her other son, Katelyn Sparks, lives in [place]. She had a recent trip to the beach. She was no longer on long-acting Namenda for unclear reasons. She was on donepezil. She was taking melatonin at night. Katelyn Sparks, who lives in [place], would try to talk to her on the phone almost on a daily basis. Her MMSE at the time was 24, clock drawing was 4-4, animal fluency was 12/m. Today, 01/11/2016: She reports more nausea, not daily, but nearly daily, no vomiting, no particular triggers or time of day. Memory-wise, she is fairly stable. She does not drink sodas. She tries to drink enough water. She drinks usually 1 ensure per day. She tries to exercise in the form of walking, sometimes she goes to the gym that is close to home.  REVIEW OF SYSTEMS: Full 14 system review of systems performed and notable only for those listed, all others are neg:  Constitutional: neg  Cardiovascular:  neg Ear/Nose/Throat: neg  Skin: neg Eyes: neg Respiratory: neg Gastroitestinal: neg  Hematology/Lymphatic: neg  Endocrine: neg Musculoskeletal:neg Allergy/Immunology: neg Neurological: neg Psychiatric: neg Sleep : neg   ALLERGIES: No Known Allergies  HOME MEDICATIONS: Outpatient Prescriptions Prior to Visit  Medication Sig Dispense Refill  . atorvastatin (LIPITOR) 80 MG tablet TAKE 1 TABLET BY MOUTH DAILY 90 tablet 3  . donepezil (ARICEPT) 10 MG tablet Take 1 tablet (10 mg total) by mouth at bedtime. 90 tablet 3  . Linaclotide (LINZESS) 145 MCG CAPS capsule Take 1 capsule (145 mcg total) by mouth daily. 90 capsule 3  . Melatonin 10 MG TABS Take by mouth.    . Memantine HCl ER (NAMENDA XR) 28 MG CP24 Take 28 mg by mouth daily. 90 capsule 3  . Omega-3 Fatty Acids (FISH OIL) 1000 MG CAPS Take by mouth.    Katelyn Sparks omeprazole (PRILOSEC) 40 MG capsule TK 1 C PO D  3  . traZODone (DESYREL) 50 MG tablet   3   No facility-administered medications prior to visit.    PAST MEDICAL HISTORY: Past Medical History  Diagnosis Date  . Osteoarthrosis, unspecified whether generalized or localized, unspecified site   . Unspecified hemorrhoids without mention of complication   . Flatulence, eructation, and gas pain   . Esophageal reflux   . Blood in stool   . Dementia     PAST SURGICAL HISTORY: Past Surgical History  Procedure Laterality Date  . Abdominal hysterectomy    . Colonoscopy  11/2010  . Upper gi  endoscopy  11/2010    FAMILY HISTORY: Family History  Problem Relation Age of Onset  . Diabetes    . Hypertension Mother   . Colon cancer Neg Hx   . Cancer Neg Hx   . Early death Neg Hx   . Heart disease Neg Hx   . Hyperlipidemia Neg Hx   . Kidney disease Neg Hx   . Learning disabilities Neg Hx   . Stroke Neg Hx   . Diabetes Mother   . Diabetes Sister   . Diabetes Brother     SOCIAL HISTORY: Social History   Social History  . Marital Status: Widowed    Spouse Name: N/A   . Number of Children: 2  . Years of Education: 12   Occupational History  . Retired    Social History Main Topics  . Smoking status: Never Smoker   . Smokeless tobacco: Never Used  . Alcohol Use: No  . Drug Use: No  . Sexual Activity: Not Currently   Other Topics Concern  . Not on file   Social History Narrative   She is a retired from a tobacco company.   Her husband passed away in 2001.  She has two son.   Patient is right handed.   Her grandson lives with her.   Patient drinks 1 cup of caffeine daily     PHYSICAL EXAM  Filed Vitals:   04/11/16 1420  BP: 120/73  Pulse: 64  Height: 5\' 6"  (1.676 m)  Weight: 140 lb 12.8 oz (63.866 kg)   Body mass index is 22.74 kg/(m^2).  Generalized: Well developed, in no acute distress, well-groomed  Head: normocephalic and atraumatic,. Oropharynx benign  Neck: Supple, no carotid bruits  Cardiac: Regular rate rhythm, no murmur  Musculoskeletal: No deformity   Neurological examination   Mentation: Alert MMSE 19/30 missing items in orientation ,calculation and 3 of 3 recall . Follows all commands speech and language fluent.   Cranial nerve II-XII: Pupils were equal round reactive to light extraocular movements were full, visual field were full on confrontational test. Facial sensation and strength were normal. hearing was intact to finger rubbing bilaterally. Uvula tongue midline. head turning and shoulder shrug were normal and symmetric.Tongue protrusion into cheek strength was normal. Motor: normal bulk and tone, full strength in the BUE, BLE, fine finger movements normal, no pronator drift. No focal weakness Sensory: normal and symmetric to light touch, in the upper and lower extremities Coordination: finger-nose-finger, heel-to-shin bilaterally, no dysmetria Reflexes: 1+ upper lower and symmetric plantar responses were flexor bilaterally. Gait and Station: Rising up from seated position without assistance, posture mildly  stooped, Narrow based stance and difficulty with turning. No assistive device   DIAGNOSTIC DATA (LABS, IMAGING, TESTING) - I reviewed patient records, labs, notes, testing and imaging myself where available.  Lab Results  Component Value Date   WBC 5.2 11/10/2015   HGB 13.0 11/10/2015   HCT 37.7 11/10/2015   MCV 86.6 11/10/2015   PLT 194.0 11/10/2015      Component Value Date/Time   NA 142 11/10/2015 1107   K 3.4* 11/10/2015 1107   CL 106 11/10/2015 1107   CO2 29 11/10/2015 1107   GLUCOSE 101* 11/10/2015 1107   BUN 20 11/10/2015 1107   CREATININE 0.78 11/10/2015 1107   CALCIUM 9.4 11/10/2015 1107   PROT 7.0 11/10/2015 1107   ALBUMIN 3.9 11/10/2015 1107   AST 18 11/10/2015 1107   ALT 15 11/10/2015 1107   ALKPHOS 95 11/10/2015  1107   BILITOT 0.4 11/10/2015 1107   GFRNONAA 79* 08/21/2013 0250   GFRAA >90 08/21/2013 0250   Lab Results  Component Value Date   CHOL 173 11/10/2015   HDL 69.50 11/10/2015   LDLCALC 88 11/10/2015   LDLDIRECT 172.6 08/28/2013   TRIG 81.0 11/10/2015   CHOLHDL 2 11/10/2015   Lab Results  Component Value Date   HGBA1C 6.0 11/10/2015    Lab Results  Component Value Date   TSH 1.97 11/10/2015      ASSESSMENT AND PLAN  81 y.o. year old female  has a past medical history ofAlzheimer's dementia versus neck dementia. There has been no behavioral disturbances. She was on generic 10 mg of Aricept however had significant complaints of nausea and this was reduced to 5 mg at last visit by Dr. Frances Furbish. Her nausea is much improved and she has actually gained about 9 pounds   Continue Aricept 5 mg daily will call in Rx Continue Namenda XR 28 mg daily Follow-up in 6 months Nilda Riggs, [facility], [facility], APRN  [facility] [address] [place], [place] [address] [phone]  I reviewed the above note and documentation by the Nurse Practitioner and agree with the history, physical exam, assessment and plan as outlined  above. I was immediately available for face-to-face consultation. Huston Foley, MD, PhD [facility])

## 2016-07-18 ENCOUNTER — Ambulatory Visit: Payer: Medicare Other | Admitting: Neurology

## 2016-09-11 DIAGNOSIS — Z23 Encounter for immunization: Secondary | ICD-10-CM | POA: Diagnosis not present

## 2016-09-19 ENCOUNTER — Encounter: Payer: Self-pay | Admitting: Neurology

## 2016-10-19 ENCOUNTER — Ambulatory Visit: Payer: Medicare Other | Admitting: Neurology

## 2017-01-11 ENCOUNTER — Telehealth: Payer: Self-pay | Admitting: Emergency Medicine

## 2017-01-11 NOTE — Telephone Encounter (Signed)
Pt son asked they you give him a call back. He has some questions about his moms CPE. He wants to know if Dr Yetta BarreJones will be able to do everything needed in that appt or if he will refer her out to somewhere else. Please advise thanks.

## 2017-01-12 NOTE — Telephone Encounter (Signed)
LVM for pt to call back as soon as possible.   RE: come get me when pt son [name](Kevin) calls back.

## 2017-01-13 NOTE — Telephone Encounter (Signed)
Spoke to pt son. He had a question about CPE appt and wanted to make sure that we could everything needed (physical, pap, mammogram) the week he is in town. I explained that due to age, pt will not need a traditional CPE but instead an AWV.  I added that we could order a mammogram and/or perform a PAP if there is an issue such as pain, discharge or bleeding but those are typical due to pt age. I explained that an AWV would assess the patient as a whole and allow for any additional assistance the patient may need to be ordered and satisfy the Medicare criteria to have a face to face in a certain amount of time. Asked Caryn BeeKevin, that since he is not with the pt if he wanted to see if there were any issues and to let us know. We can try to get all appts needed set for the week that he is in town. Caryn BeeKevin (pt son) stated understanding and will call back if there is any issues as stated above.

## 2017-03-06 ENCOUNTER — Ambulatory Visit (INDEPENDENT_AMBULATORY_CARE_PROVIDER_SITE_OTHER): Payer: Medicare Other | Admitting: Internal Medicine

## 2017-03-06 ENCOUNTER — Other Ambulatory Visit (INDEPENDENT_AMBULATORY_CARE_PROVIDER_SITE_OTHER): Payer: Medicare Other

## 2017-03-06 ENCOUNTER — Encounter: Payer: Self-pay | Admitting: Internal Medicine

## 2017-03-06 VITALS — BP 118/60 | HR 69 | Temp 97.8°F | Resp 16 | Ht 66.0 in | Wt 138.0 lb

## 2017-03-06 DIAGNOSIS — E785 Hyperlipidemia, unspecified: Secondary | ICD-10-CM

## 2017-03-06 DIAGNOSIS — Z0000 Encounter for general adult medical examination without abnormal findings: Secondary | ICD-10-CM

## 2017-03-06 DIAGNOSIS — K5909 Other constipation: Secondary | ICD-10-CM | POA: Diagnosis not present

## 2017-03-06 DIAGNOSIS — K219 Gastro-esophageal reflux disease without esophagitis: Secondary | ICD-10-CM | POA: Diagnosis not present

## 2017-03-06 DIAGNOSIS — R739 Hyperglycemia, unspecified: Secondary | ICD-10-CM

## 2017-03-06 DIAGNOSIS — G4701 Insomnia due to medical condition: Secondary | ICD-10-CM

## 2017-03-06 LAB — COMPREHENSIVE METABOLIC PANEL
ALK PHOS: 65 U/L (ref 39–117)
ALT: 12 U/L (ref 0–35)
AST: 18 U/L (ref 0–37)
Albumin: 4.3 g/dL (ref 3.5–5.2)
BUN: 13 mg/dL (ref 6–23)
CO2: 28 mEq/L (ref 19–32)
Calcium: 9.4 mg/dL (ref 8.4–10.5)
Chloride: 105 mEq/L (ref 96–112)
Creatinine, Ser: 0.88 mg/dL (ref 0.40–1.20)
GFR: 79.53 mL/min (ref >60.00)
GLUCOSE: 133 mg/dL — AB (ref 70–99)
POTASSIUM: 4.2 meq/L (ref 3.5–5.1)
SODIUM: 138 meq/L (ref 135–145)
TOTAL PROTEIN: 7.7 g/dL (ref 6.0–8.3)
Total Bilirubin: 0.7 mg/dL (ref 0.2–1.2)

## 2017-03-06 LAB — CBC WITH DIFFERENTIAL/PLATELET
BASOS PCT: 0.7 % (ref 0.0–3.0)
Basophils Absolute: 0 10*3/uL (ref 0.0–0.1)
EOS PCT: 1.5 % (ref 0.0–5.0)
Eosinophils Absolute: 0.1 10*3/uL (ref 0.0–0.7)
HCT: 37 % (ref 36.0–46.0)
Hemoglobin: 13.1 g/dL (ref 12.0–15.0)
LYMPHS ABS: 1.5 10*3/uL (ref 0.7–4.0)
Lymphocytes Relative: 41.6 % (ref 12.0–46.0)
MCHC: 35.4 g/dL (ref 30.0–36.0)
MCV: 84.9 fl (ref 78.0–100.0)
MONO ABS: 0.3 10*3/uL (ref 0.1–1.0)
Monocytes Relative: 7.9 % (ref 3.0–12.0)
NEUTROS PCT: 48.3 % (ref 43.0–77.0)
Neutro Abs: 1.8 10*3/uL (ref 1.4–7.7)
Platelets: 217 10*3/uL (ref 150.0–400.0)
RBC: 4.36 Mil/uL (ref 3.87–5.11)
RDW: 13.9 % (ref 11.5–15.5)
WBC: 3.7 10*3/uL — ABNORMAL LOW (ref 4.0–10.5)

## 2017-03-06 LAB — LIPID PANEL
CHOL/HDL RATIO: 4
Cholesterol: 292 mg/dL — ABNORMAL HIGH (ref 0–200)
HDL: 69.1 mg/dL (ref >39.00)
LDL CALC: 207 mg/dL — AB (ref 0–99)
NonHDL: 222.53
Triglycerides: 76 mg/dL (ref 0.0–149.0)
VLDL: 15.2 mg/dL (ref 0.0–40.0)

## 2017-03-06 LAB — HEMOGLOBIN A1C: Hgb A1c MFr Bld: 6.2 % (ref 4.6–6.5)

## 2017-03-06 MED ORDER — TRAZODONE HCL 50 MG PO TABS: 50.0000 mg | ORAL_TABLET | Freq: Every day | ORAL | 3 refills | Status: DC

## 2017-03-06 MED ORDER — OMEPRAZOLE 40 MG PO CPDR: 40.0000 mg | DELAYED_RELEASE_CAPSULE | Freq: Every day | ORAL | 3 refills | Status: DC

## 2017-03-06 MED ORDER — LINACLOTIDE 145 MCG PO CAPS: 145.0000 ug | ORAL_CAPSULE | Freq: Every day | ORAL | 3 refills | Status: DC

## 2017-03-06 NOTE — Progress Notes (Signed)
Pre visit review using our clinic review tool, if applicable. No additional management support is needed unless otherwise documented below in the visit note. 

## 2017-03-06 NOTE — Patient Instructions (Signed)
Health Maintenance, Female Adopting a healthy lifestyle and getting preventive care can go a long way to promote health and wellness. Talk with your health care provider about what schedule of regular examinations is right for you. This is a good chance for you to check in with your provider about disease prevention and staying healthy. In between checkups, there are plenty of things you can do on your own. Experts have done a lot of research about which lifestyle changes and preventive measures are most likely to keep you healthy. Ask your health care provider for more information. Weight and diet Eat a healthy diet  Be sure to include plenty of vegetables, fruits, low-fat dairy products, and lean protein.  Do not eat a lot of foods high in solid fats, added sugars, or salt.  Get regular exercise. This is one of the most important things you can do for your health.  Most adults should exercise for at least 150 minutes each week. The exercise should increase your heart rate and make you sweat (moderate-intensity exercise).  Most adults should also do strengthening exercises at least twice a week. This is in addition to the moderate-intensity exercise. Maintain a healthy weight  Body mass index (BMI) is a measurement that can be used to identify possible weight problems. It estimates body fat based on height and weight. Your health care provider can help determine your BMI and help you achieve or maintain a healthy weight.  For females 76 years of age and older:  A BMI below 18.5 is considered underweight.  A BMI of 18.5 to 24.9 is normal.  A BMI of 25 to 29.9 is considered overweight.  A BMI of 30 and above is considered obese. Watch levels of cholesterol and blood lipids  You should start having your blood tested for lipids and cholesterol at 80 years of age, then have this test every 5 years.  You may need to have your cholesterol levels checked more often if:  Your lipid or  cholesterol levels are high.  You are older than 80 years of age.  You are at high risk for heart disease. Cancer screening Lung Cancer  Lung cancer screening is recommended for adults 64-42 years old who are at high risk for lung cancer because of a history of smoking.  A yearly low-dose CT scan of the lungs is recommended for people who:  Currently smoke.  Have quit within the past 15 years.  Have at least a 30-pack-year history of smoking. A pack year is smoking an average of one pack of cigarettes a day for 1 year.  Yearly screening should continue until it has been 15 years since you quit.  Yearly screening should stop if you develop a health problem that would prevent you from having lung cancer treatment. Breast Cancer  Practice breast self-awareness. This means understanding how your breasts normally appear and feel.  It also means doing regular breast self-exams. Let your health care provider know about any changes, no matter how small.  If you are in your 20s or 30s, you should have a clinical breast exam (CBE) by a health care provider every 1-3 years as part of a regular health exam.  If you are 34 or older, have a CBE every year. Also consider having a breast X-ray (mammogram) every year.  If you have a family history of breast cancer, talk to your health care provider about genetic screening.  If you are at high risk for breast cancer, talk  to your health care provider about having an MRI and a mammogram every year.  Breast cancer gene (BRCA) assessment is recommended for women who have family members with BRCA-related cancers. BRCA-related cancers include:  Breast.  Ovarian.  Tubal.  Peritoneal cancers.  Results of the assessment will determine the need for genetic counseling and BRCA1 and BRCA2 testing. Cervical Cancer  Your health care provider may recommend that you be screened regularly for cancer of the pelvic organs (ovaries, uterus, and vagina).  This screening involves a pelvic examination, including checking for microscopic changes to the surface of your cervix (Pap test). You may be encouraged to have this screening done every 3 years, beginning at age 24.  For women ages 66-65, health care providers may recommend pelvic exams and Pap testing every 3 years, or they may recommend the Pap and pelvic exam, combined with testing for human papilloma virus (HPV), every 5 years. Some types of HPV increase your risk of cervical cancer. Testing for HPV may also be done on women of any age with unclear Pap test results.  Other health care providers may not recommend any screening for nonpregnant women who are considered low risk for pelvic cancer and who do not have symptoms. Ask your health care provider if a screening pelvic exam is right for you.  If you have had past treatment for cervical cancer or a condition that could lead to cancer, you need Pap tests and screening for cancer for at least 20 years after your treatment. If Pap tests have been discontinued, your risk factors (such as having a new sexual partner) need to be reassessed to determine if screening should resume. Some women have medical problems that increase the chance of getting cervical cancer. In these cases, your health care provider may recommend more frequent screening and Pap tests. Colorectal Cancer  This type of cancer can be detected and often prevented.  Routine colorectal cancer screening usually begins at 80 years of age and continues through 80 years of age.  Your health care provider may recommend screening at an earlier age if you have risk factors for colon cancer.  Your health care provider may also recommend using home test kits to check for hidden blood in the stool.  A small camera at the end of a tube can be used to examine your colon directly (sigmoidoscopy or colonoscopy). This is done to check for the earliest forms of colorectal cancer.  Routine  screening usually begins at age 41.  Direct examination of the colon should be repeated every 5-10 years through 80 years of age. However, you may need to be screened more often if early forms of precancerous polyps or small growths are found. Skin Cancer  Check your skin from head to toe regularly.  Tell your health care provider about any new moles or changes in moles, especially if there is a change in a mole's shape or color.  Also tell your health care provider if you have a mole that is larger than the size of a pencil eraser.  Always use sunscreen. Apply sunscreen liberally and repeatedly throughout the day.  Protect yourself by wearing long sleeves, pants, a wide-brimmed hat, and sunglasses whenever you are outside. Heart disease, diabetes, and high blood pressure  High blood pressure causes heart disease and increases the risk of stroke. High blood pressure is more likely to develop in:  People who have blood pressure in the high end of the normal range (130-139/85-89 mm Hg).  People who are overweight or obese.  People who are African American.  If you are 59-24 years of age, have your blood pressure checked every 3-5 years. If you are 34 years of age or older, have your blood pressure checked every year. You should have your blood pressure measured twice-once when you are at a hospital or clinic, and once when you are not at a hospital or clinic. Record the average of the two measurements. To check your blood pressure when you are not at a hospital or clinic, you can use:  An automated blood pressure machine at a pharmacy.  A home blood pressure monitor.  If you are between 29 years and 60 years old, ask your health care provider if you should take aspirin to prevent strokes.  Have regular diabetes screenings. This involves taking a blood sample to check your fasting blood sugar level.  If you are at a normal weight and have a low risk for diabetes, have this test once  every three years after 80 years of age.  If you are overweight and have a high risk for diabetes, consider being tested at a younger age or more often. Preventing infection Hepatitis B  If you have a higher risk for hepatitis B, you should be screened for this virus. You are considered at high risk for hepatitis B if:  You were born in a country where hepatitis B is common. Ask your health care provider which countries are considered high risk.  Your parents were born in a high-risk country, and you have not been immunized against hepatitis B (hepatitis B vaccine).  You have HIV or AIDS.  You use needles to inject street drugs.  You live with someone who has hepatitis B.  You have had sex with someone who has hepatitis B.  You get hemodialysis treatment.  You take certain medicines for conditions, including cancer, organ transplantation, and autoimmune conditions. Hepatitis C  Blood testing is recommended for:  Everyone born from [birth year] through 1963.  Anyone with known risk factors for hepatitis C. Sexually transmitted infections (STIs)  You should be screened for sexually transmitted infections (STIs) including gonorrhea and chlamydia if:  You are sexually active and are younger than 80 years of age.  You are older than 80 years of age and your health care provider tells you that you are at risk for this type of infection.  Your sexual activity has changed since you were last screened and you are at an increased risk for chlamydia or gonorrhea. Ask your health care provider if you are at risk.  If you do not have HIV, but are at risk, it may be recommended that you take a prescription medicine daily to prevent HIV infection. This is called pre-exposure prophylaxis (PrEP). You are considered at risk if:  You are sexually active and do not regularly use condoms or know the HIV status of your partner(s).  You take drugs by injection.  You are sexually active with a partner  who has HIV. Talk with your health care provider about whether you are at high risk of being infected with HIV. If you choose to begin PrEP, you should first be tested for HIV. You should then be tested every 3 months for as long as you are taking PrEP. Pregnancy  If you are premenopausal and you may become pregnant, ask your health care provider about preconception counseling.  If you may become pregnant, take 400 to 800 micrograms (mcg) of folic acid  every day.  If you want to prevent pregnancy, talk to your health care provider about birth control (contraception). Osteoporosis and menopause  Osteoporosis is a disease in which the bones lose minerals and strength with aging. This can result in serious bone fractures. Your risk for osteoporosis can be identified using a bone density scan.  If you are 4 years of age or older, or if you are at risk for osteoporosis and fractures, ask your health care provider if you should be screened.  Ask your health care provider whether you should take a calcium or vitamin D supplement to lower your risk for osteoporosis.  Menopause may have certain physical symptoms and risks.  Hormone replacement therapy may reduce some of these symptoms and risks. Talk to your health care provider about whether hormone replacement therapy is right for you. Follow these instructions at home:  Schedule regular health, dental, and eye exams.  Stay current with your immunizations.  Do not use any tobacco products including cigarettes, chewing tobacco, or electronic cigarettes.  If you are pregnant, do not drink alcohol.  If you are breastfeeding, limit how much and how often you drink alcohol.  Limit alcohol intake to no more than 1 drink per day for nonpregnant women. One drink equals 12 ounces of beer, 5 ounces of wine, or 1 ounces of hard liquor.  Do not use street drugs.  Do not share needles.  Ask your health care provider for help if you need support  or information about quitting drugs.  Tell your health care provider if you often feel depressed.  Tell your health care provider if you have ever been abused or do not feel safe at home. This information is not intended to replace advice given to you by your health care provider. Make sure you discuss any questions you have with your health care provider. Document Released: 06/13/2011 Document Revised: 05/05/2016 Document Reviewed: 09/01/2015 Elsevier Interactive Patient Education  2017 [employer].

## 2017-03-06 NOTE — Progress Notes (Signed)
Subjective:  Patient ID: Katelyn Sparks, female    DOB: 11/22/1935  Age: 82 y.o. MRN: [id number]  CC: Annual Exam and Hyperlipidemia   HPI Katelyn Sparks presents for am AWV/CPX.  She and her family have decided to stop taking the medications for dementia since they were not helping. Her cognitive function is at its baseline. She lives with her son and gets help with meal preparation. She complains of persistent insomnia and constipation.  Past Medical History:  Diagnosis Date  . Blood in stool   . Dementia   . Esophageal reflux   . Flatulence, eructation, and gas pain   . Osteoarthrosis, unspecified whether generalized or localized, unspecified site   . Unspecified hemorrhoids without mention of complication    Past Surgical History:  Procedure Laterality Date  . ABDOMINAL HYSTERECTOMY    . COLONOSCOPY  11/2010  . UPPER GI ENDOSCOPY  11/2010    reports that she has never smoked. She has never used smokeless tobacco. She reports that she does not drink alcohol or use drugs. family history includes Diabetes in her brother, mother, and sister; Hypertension in her mother. No Known Allergies  Outpatient Medications Prior to Visit  Medication Sig Dispense Refill  . atorvastatin (LIPITOR) 80 MG tablet TAKE 1 TABLET BY MOUTH DAILY 90 tablet 3  . Omega-3 Fatty Acids (FISH OIL) 1000 MG CAPS Take by mouth.    . donepezil (ARICEPT) 5 MG tablet Take 1 tablet (5 mg total) by mouth at bedtime. (Patient not taking: Reported on 03/06/2017) 90 tablet 1  . Linaclotide (LINZESS) 145 MCG CAPS capsule Take 1 capsule (145 mcg total) by mouth daily. (Patient not taking: Reported on 03/06/2017) 90 capsule 3  . Melatonin 10 MG TABS Take by mouth.    . Memantine HCl ER (NAMENDA XR) 28 MG CP24 Take 28 mg by mouth daily. (Patient not taking: Reported on 03/06/2017) 90 capsule 3  . omeprazole (PRILOSEC) 40 MG capsule TK 1 C PO D  3  . traZODone (DESYREL) 50 MG tablet   3   No facility-administered  medications prior to visit.     ROS Review of Systems  Constitutional: Negative.  Negative for activity change, appetite change, diaphoresis, fatigue and unexpected weight change.  HENT: Negative.   Eyes: Negative.   Respiratory: Negative for chest tightness and shortness of breath.   Cardiovascular: Negative for chest pain, palpitations and leg swelling.  Gastrointestinal: Positive for constipation. Negative for abdominal pain, anal bleeding, diarrhea, nausea and vomiting.  Endocrine: Negative.  Negative for cold intolerance.  Genitourinary: Negative.  Negative for difficulty urinating.  Musculoskeletal: Negative.  Negative for arthralgias and myalgias.  Allergic/Immunologic: Negative.   Neurological: Negative.   Hematological: Negative for adenopathy. Does not bruise/bleed easily.  Psychiatric/Behavioral: Positive for confusion, decreased concentration and sleep disturbance. Negative for agitation, behavioral problems, dysphoric mood, hallucinations, self-injury and suicidal ideas. The patient is not nervous/anxious.     Objective:  BP 118/60 (BP Location: Right Arm, Patient Position: Sitting, Cuff Size: Normal)   Pulse 69   Temp 97.8 F (36.6 C) (Oral)   Resp 16   Ht 5\' 6"  (1.676 m)   Wt 138 lb (62.6 kg)   SpO2 94%   BMI 22.27 kg/m   BP Readings from Last 3 Encounters:  03/06/17 118/60  04/11/16 120/73  01/11/16 118/70    Wt Readings from Last 3 Encounters:  03/06/17 138 lb (62.6 kg)  04/11/16 140 lb 12.8 oz (63.9 kg)  01/11/16 131  lb (59.4 kg)    Physical Exam  Constitutional: She is oriented to person, place, and time. No distress.  HENT:  Mouth/Throat: Oropharynx is clear and moist. No oropharyngeal exudate.  Eyes: Conjunctivae are normal. Right eye exhibits no discharge. Left eye exhibits no discharge. No scleral icterus.  Neck: Normal range of motion. Neck supple. No JVD present. No tracheal deviation present. No thyromegaly present.  Cardiovascular: Normal  rate, normal heart sounds and intact distal pulses.  Exam reveals no gallop and no friction rub.   No murmur heard. Pulmonary/Chest: Effort normal and breath sounds normal. No stridor. No respiratory distress. She has no wheezes. She has no rales. She exhibits no tenderness.  Abdominal: Soft. Bowel sounds are normal. She exhibits no distension and no mass. There is no tenderness. There is no rebound and no guarding.  Musculoskeletal: Normal range of motion. She exhibits no edema, tenderness or deformity.  Lymphadenopathy:    She has no cervical adenopathy.  Neurological: She is oriented to person, place, and time.  Skin: Skin is warm and dry. No rash noted. She is not diaphoretic. No erythema. No pallor.  Psychiatric: She has a normal mood and affect. Her behavior is normal. Judgment and thought content normal.  Vitals reviewed.   Lab Results  Component Value Date   WBC 3.7 (L) 03/06/2017   HGB 13.1 03/06/2017   HCT 37.0 03/06/2017   PLT 217.0 03/06/2017   GLUCOSE 133 (H) 03/06/2017   CHOL 292 (H) 03/06/2017   TRIG 76.0 03/06/2017   HDL 69.10 03/06/2017   LDLDIRECT 172.6 08/28/2013   LDLCALC 207 (H) 03/06/2017   ALT 12 03/06/2017   AST 18 03/06/2017   NA 138 03/06/2017   K 4.2 03/06/2017   CL 105 03/06/2017   CREATININE 0.88 03/06/2017   BUN 13 03/06/2017   CO2 28 03/06/2017   TSH 1.55 03/06/2017   HGBA1C 6.2 03/06/2017    Mr Brain Wo Contrast  Result Date: 01/09/2014 [facility] NEUROIMAGING REPORT STUDY DATE: 01/08/14 PATIENT NAME: Katelyn Sparks DOB: 03-17-1935 MRN: [id number] ORDERING CLINICIAN: Joycelyn Schmid, MD CLINICAL HISTORY: 82 year old female with dementia. EXAM: MRI brain (without) TECHNIQUE: MRI of the brain without contrast was obtained utilizing 5 mm axial slices with T1, T2, T2 flair, SWI and diffusion weighted views.  T1 sagittal and T2 coronal views were obtained. CONTRAST: no IMAGING SITE: [facility] [address] (1.5 Tesla  MRI)  FINDINGS: No abnormal lesions are seen on diffusion-weighted views to suggest acute ischemia. The cortical sulci, fissures and cisterns are normal in size and appearance. Lateral, third and fourth ventricle are normal in size and appearance. No extra-axial fluid collections are seen. No evidence of mass effect or midline shift. Moderate periventricular and subcortical chronic small vessel ischemic disease. On sagittal views the posterior fossa, pituitary gland and corpus callosum are unremarkable. Left parietal convexity chronic cerebral microhemorrhage. The orbits and their contents, paranasal sinuses and calvarium are notable for post-surgical orbits.  Intracranial flow voids are present.   Abnormal MRI brain (without) demonstrating: 1. Moderate periventricular and subcortical chronic small vessel ischemic disease. 2. Punctate left parietal convexity chronic cerebral microhemorrhage. INTERPRETING PHYSICIAN: Suanne Marker, MD Certified in Neurology, Neurophysiology and Neuroimaging [facility] [address], [address] [place], [place] [address] [phone]    Assessment & Plan:   Katelyn Sparks was seen today for annual exam and hyperlipidemia.  Diagnoses and all orders for this visit:  Chronic constipation- her labs are negative  for any secondary metabolic causes and she doesn't take any medications associated with constipation. Will treat for chronic idiopathic constipation with lens asked. -     Comprehensive metabolic panel; Future -     CBC with Differential/Platelet; Future -     Thyroid Panel With TSH; Future -     linaclotide (LINZESS) 145 MCG CAPS capsule; Take 1 capsule (145 mcg total) by mouth daily.  Hyperglycemia- her A1c is up to 6.2%, she is prediabetic but no medications are needed at this time. -     Comprehensive metabolic panel; Future -     Hemoglobin A1c; Future  Hyperlipidemia with target LDL less than 130- she has not been taking the statin and her  Framingham risk score is only 5% so I do not recommend that she restart a statin. -     Thyroid Panel With TSH; Future -     Lipid panel; Future  Insomnia due to medical condition -     traZODone (DESYREL) 50 MG tablet; Take 1 tablet (50 mg total) by mouth at bedtime.  GERD without esophagitis -     omeprazole (PRILOSEC) 40 MG capsule; Take 1 capsule (40 mg total) by mouth daily.   I have discontinued Ms. All's memantine, Melatonin, and donepezil. I have also changed her traZODone and omeprazole. Additionally, I am having her maintain her atorvastatin, Fish Oil, and linaclotide.  Meds ordered this encounter  Medications  . traZODone (DESYREL) 50 MG tablet    Sig: Take 1 tablet (50 mg total) by mouth at bedtime.    Dispense:  90 tablet    Refill:  3  . linaclotide (LINZESS) 145 MCG CAPS capsule    Sig: Take 1 capsule (145 mcg total) by mouth daily.    Dispense:  90 capsule    Refill:  3  . omeprazole (PRILOSEC) 40 MG capsule    Sig: Take 1 capsule (40 mg total) by mouth daily.    Dispense:  90 capsule    Refill:  3   See AVS for instructions about healthy living and anticipatory guidance.   Follow-up: Return in about 6 months (around 09/06/2017).  Sanda Linger, MD

## 2017-03-07 DIAGNOSIS — Z1231 Encounter for screening mammogram for malignant neoplasm of breast: Secondary | ICD-10-CM | POA: Diagnosis not present

## 2017-03-07 DIAGNOSIS — H2513 Age-related nuclear cataract, bilateral: Secondary | ICD-10-CM | POA: Diagnosis not present

## 2017-03-07 LAB — HM MAMMOGRAPHY

## 2017-03-07 LAB — THYROID PANEL WITH TSH
Free Thyroxine Index: 2 (ref 1.4–3.8)
T3 Uptake: 29 % (ref 22–35)
T4 TOTAL: 6.9 ug/dL (ref 4.5–12.0)
TSH: 1.55 mIU/L

## 2017-03-09 ENCOUNTER — Encounter: Payer: Self-pay | Admitting: Internal Medicine

## 2017-03-09 DIAGNOSIS — Z961 Presence of intraocular lens: Secondary | ICD-10-CM | POA: Diagnosis not present

## 2017-03-09 DIAGNOSIS — H26492 Other secondary cataract, left eye: Secondary | ICD-10-CM | POA: Diagnosis not present

## 2017-03-09 NOTE — Progress Notes (Signed)
Result abstracted 

## 2017-03-12 NOTE — Assessment & Plan Note (Signed)

## 2017-05-31 ENCOUNTER — Telehealth: Payer: Self-pay | Admitting: *Deleted

## 2017-05-31 NOTE — Telephone Encounter (Signed)
Rec'd call pt son states mom is needing refill on the Atorvastatin. Med is no longer on med list pls advise...Raechel Chute/lmb

## 2017-06-01 NOTE — Telephone Encounter (Signed)
Notified son w/MD response. Son is wanting to know if she need to take another cholesterol med because her cholesterol was elevated when she saw you in March. Son states that MD discontinue the Aricept due to making mom feel nausea...Raechel Chute/lmb

## 2017-06-01 NOTE — Telephone Encounter (Signed)
I have recommended that she stop taking this

## 2017-06-08 NOTE — Telephone Encounter (Signed)
Called son and informed reason for discontinuing the statin medication. "Hyperlipidemia with target LDL less than 130- she has not been taking the statin and her Framingham risk score is only 5% so I do not recommend that she restart a statin."  Son stated understanding and thanked for my time.

## 2017-06-08 NOTE — Telephone Encounter (Signed)
Son called in to check on this, son would like a nurse to call him about this Cholesterol meds

## 2017-08-02 ENCOUNTER — Telehealth: Payer: Self-pay | Admitting: Internal Medicine

## 2017-08-02 NOTE — Telephone Encounter (Signed)
Error

## 2017-09-06 ENCOUNTER — Ambulatory Visit: Payer: Medicare Other | Admitting: Family Medicine

## 2017-09-11 ENCOUNTER — Ambulatory Visit: Payer: Medicare Other | Admitting: Internal Medicine

## 2017-09-20 ENCOUNTER — Ambulatory Visit (INDEPENDENT_AMBULATORY_CARE_PROVIDER_SITE_OTHER): Payer: Medicare Other | Admitting: Family Medicine

## 2017-09-20 ENCOUNTER — Encounter: Payer: Self-pay | Admitting: Family Medicine

## 2017-09-20 VITALS — BP 104/66 | HR 90 | Temp 97.8°F | Resp 14 | Ht 66.0 in | Wt 136.8 lb

## 2017-09-20 DIAGNOSIS — Z136 Encounter for screening for cardiovascular disorders: Secondary | ICD-10-CM

## 2017-09-20 DIAGNOSIS — K5900 Constipation, unspecified: Secondary | ICD-10-CM

## 2017-09-20 DIAGNOSIS — Z7689 Persons encountering health services in other specified circumstances: Secondary | ICD-10-CM

## 2017-09-20 DIAGNOSIS — Z1389 Encounter for screening for other disorder: Secondary | ICD-10-CM

## 2017-09-20 LAB — POCT URINALYSIS DIP (DEVICE)
Bilirubin Urine: NEGATIVE
Glucose, UA: NEGATIVE mg/dL
Hgb urine dipstick: NEGATIVE
KETONES UR: NEGATIVE mg/dL
Leukocytes, UA: NEGATIVE
Nitrite: NEGATIVE
PH: 5 (ref 5.0–8.0)
PROTEIN: 30 mg/dL — AB
Specific Gravity, Urine: 1.025 (ref 1.005–1.030)
UROBILINOGEN UA: 0.2 mg/dL (ref 0.0–1.0)

## 2017-09-20 NOTE — Patient Instructions (Signed)
Very nice meeting you! Continue to increase water intake and attempt to drink 6 glasses of water daily to improve constipation.  I'll see you back in March for your annual wellness visit.  Godfrey Pick. Tiburcio Pea, MSN, FNP-C [facility]  [address] [place], [place] [address] [phone]      Constipation, Adult Constipation is when a person:  Poops (has a bowel movement) fewer times in a week than normal.  Has a hard time pooping.  Has poop that is dry, hard, or bigger than normal.  Follow these instructions at home: Eating and drinking   Eat foods that have a lot of fiber, such as: ? Fresh fruits and vegetables. ? Whole grains. ? Beans.  Eat less of foods that are high in fat, low in fiber, or overly processed, such as: ? [place] fries. ? Hamburgers. ? Cookies. ? Candy. ? Soda.  Drink enough fluid to keep your pee (urine) clear or pale yellow. General instructions  Exercise regularly or as told by your doctor.  Go to the restroom when you feel like you need to poop. Do not hold it in.  Take over-the-counter and prescription medicines only as told by your doctor. These include any fiber supplements.  Do pelvic floor retraining exercises, such as: ? Doing deep breathing while relaxing your lower belly (abdomen). ? Relaxing your pelvic floor while pooping.  Watch your condition for any changes.  Keep all follow-up visits as told by your doctor. This is important. Contact a doctor if:  You have pain that gets worse.  You have a fever.  You have not pooped for 4 days.  You throw up (vomit).  You are not hungry.  You lose weight.  You are bleeding from the anus.  You have thin, pencil-like poop (stool). Get help right away if:  You have a fever, and your symptoms suddenly get worse.  You leak poop or have blood in your poop.  Your belly feels hard or bigger than normal (is bloated).  You have very bad belly  pain.  You feel dizzy or you faint. This information is not intended to replace advice given to you by your health care provider. Make sure you discuss any questions you have with your health care provider. Document Released: 05/16/2008 Document Revised: 06/17/2016 Document Reviewed: 05/18/2016 Elsevier Interactive Patient Education  2017 [employer].

## 2017-09-20 NOTE — Progress Notes (Signed)
Patient ID: DANENE MONTIJO, female    DOB: [birth year], 82 y.o.   MRN: [id number]  PCP: Bing Neighbors, FNP  Chief Complaint  Patient presents with  . Establish Care    Subjective:  HPI HINLEY BRIMAGE is a 82 y.o. female presents to establish care. She currently suffers from acid reflux, irregular bowel syndrome, and insomnia. She is accompanied today by her son. Eddy reports overall great health. She has no previous history of CAD, CVA, hypertension or diabetes. Her only complaint includes chronic constipation although she reports a daily bowel movement. She reports that she is very active and participates in senior exercise program several times weekly and drinks 3-4 bottles of water daily. She has not experienced any recent falls, dizziness, chest pain, or headaches. She has no other complaints. Social History   Social History  . Marital status: Widowed    Spouse name: N/A  . Number of children: 2  . Years of education: 12   Occupational History  . Retired Retired   Social History Main Topics  . Smoking status: Never Smoker  . Smokeless tobacco: Never Used  . Alcohol use No  . Drug use: No  . Sexual activity: Not Currently   Other Topics Concern  . Not on file   Social History Narrative   She is a retired from a tobacco company.   Her husband passed away in 2000-04-17.  She has two son.   Patient is right handed.   Her grandson lives with her.   Patient drinks 1 cup of caffeine daily    Family History  Problem Relation Age of Onset  . Hypertension Mother   . Diabetes Mother   . Diabetes Sister   . Diabetes Brother   . Diabetes Unknown   . Colon cancer Neg Hx   . Cancer Neg Hx   . Early death Neg Hx   . Heart disease Neg Hx   . Hyperlipidemia Neg Hx   . Kidney disease Neg Hx   . Learning disabilities Neg Hx   . Stroke Neg Hx    Review of Systems  See history of present illness Patient Active Problem List   Diagnosis Date Noted  . Insomnia due to medical  condition 08/20/2014  . Dementia arising in the senium and presenium 08/20/2014  . Chronic constipation 11/12/2013  . Hyperglycemia 08/28/2013  . Hyperlipidemia with target LDL less than 130 08/28/2013  . Other screening mammogram 08/28/2013  . Osteopenia 08/28/2013  . Routine general medical examination at a health care facility 08/28/2013  . GERD without esophagitis 07/26/2010    No Known Allergies  Prior to Admission medications   Medication Sig Start Date End Date Taking? Authorizing Provider  linaclotide (LINZESS) 145 MCG CAPS capsule Take 1 capsule (145 mcg total) by mouth daily. 03/06/17  Yes Etta Grandchild, MD  Omega-3 Fatty Acids (FISH OIL) 1000 MG CAPS Take by mouth.   Yes [provider]  omeprazole (PRILOSEC) 40 MG capsule Take 1 capsule (40 mg total) by mouth daily. 03/06/17  Yes Etta Grandchild, MD  traZODone (DESYREL) 50 MG tablet Take 1 tablet (50 mg total) by mouth at bedtime. 03/06/17  Yes Etta Grandchild, MD    Past Medical, Surgical Family and Social History reviewed and updated.    Objective:   Today's Vitals   09/20/17 1027  BP: 104/66  Pulse: 90  Resp: 14  Temp: 97.8 F (36.6 C)  TempSrc: Oral  SpO2: 99%  Weight: 136 lb 12.8 oz (62.1 kg)  Height:  (1.676 m)    Wt Readings from Last 3 Encounters:  09/20/17 136 lb 12.8 oz (62.1 kg)  03/06/17 138 lb (62.6 kg)  04/11/16 140 lb 12.8 oz (63.9 kg)   Physical Exam  Constitutional: She is oriented to person, place, and time. She appears well-developed and well-nourished.  HENT:  Head: Normocephalic and atraumatic.  Eyes: Pupils are equal, round, and reactive to light. Conjunctivae are normal.  Neck: Normal range of motion. Neck supple.  Cardiovascular: Normal rate, regular rhythm, normal heart sounds and intact distal pulses.   No murmur heard. Pulmonary/Chest: Effort normal and breath sounds normal.  Abdominal: Soft.  Musculoskeletal: Normal range of motion.  Neurological: She is alert  and oriented to person, place, and time.  Skin: Skin is warm and dry.  Psychiatric: She has a normal mood and affect. Her behavior is normal. Judgment and thought content normal.   Assessment & Plan:  1. Encounter to establish care 2. Screening for cardiovascular condition- EKG 12-Lead, unremarkable, NSR 3. Constipation, unspecified constipation type, continue Linzess  Serenitie is in overall good health for an 82 year old African-American female. She has no acute issues or complaints today, I will  have her follow-up in 6 months for routine wellness visit.    Godfrey Pick. Tiburcio Pea, MSN, FNP-C [facility]  [address] [place], [place] [address] [phone]

## 2018-02-26 ENCOUNTER — Ambulatory Visit: Payer: Medicare Other | Admitting: Family Medicine

## 2018-06-01 ENCOUNTER — Ambulatory Visit (INDEPENDENT_AMBULATORY_CARE_PROVIDER_SITE_OTHER): Payer: Medicare HMO | Admitting: Family Medicine

## 2018-06-01 ENCOUNTER — Encounter: Payer: Self-pay | Admitting: Family Medicine

## 2018-06-01 VITALS — BP 128/70 | HR 74 | Temp 98.0°F | Ht 66.0 in | Wt 135.0 lb

## 2018-06-01 DIAGNOSIS — B351 Tinea unguium: Secondary | ICD-10-CM

## 2018-06-01 DIAGNOSIS — K5900 Constipation, unspecified: Secondary | ICD-10-CM

## 2018-06-01 DIAGNOSIS — Z09 Encounter for follow-up examination after completed treatment for conditions other than malignant neoplasm: Secondary | ICD-10-CM

## 2018-06-01 DIAGNOSIS — Z131 Encounter for screening for diabetes mellitus: Secondary | ICD-10-CM | POA: Diagnosis not present

## 2018-06-01 DIAGNOSIS — N390 Urinary tract infection, site not specified: Secondary | ICD-10-CM | POA: Diagnosis not present

## 2018-06-01 DIAGNOSIS — G4700 Insomnia, unspecified: Secondary | ICD-10-CM | POA: Diagnosis not present

## 2018-06-01 DIAGNOSIS — R7303 Prediabetes: Secondary | ICD-10-CM

## 2018-06-01 LAB — POCT GLYCOSYLATED HEMOGLOBIN (HGB A1C): Hemoglobin A1C: 6.2 % — AB (ref 4.0–5.6)

## 2018-06-01 LAB — POCT URINALYSIS DIP (MANUAL ENTRY)
Bilirubin, UA: NEGATIVE
Blood, UA: NEGATIVE
Glucose, UA: NEGATIVE mg/dL
Ketones, POC UA: NEGATIVE mg/dL
Nitrite, UA: NEGATIVE
Protein Ur, POC: NEGATIVE mg/dL
Spec Grav, UA: 1.025 (ref 1.010–1.025)
Urobilinogen, UA: 0.2 E.U./dL
pH, UA: 5 (ref 5.0–8.0)

## 2018-06-01 MED ORDER — TRAZODONE HCL 50 MG PO TABS: ORAL_TABLET | ORAL | 1 refills | Status: DC

## 2018-06-01 MED ORDER — SENNA 8.6 MG PO TABS
1.0000 | ORAL_TABLET | Freq: Every day | ORAL | 2 refills | Status: DC
Start: 2018-06-01 — End: 2018-06-04

## 2018-06-01 NOTE — Progress Notes (Signed)
Subjective:    Patient ID: Katelyn Sparks, female    DOB: 1935-02-21, 83 y.o.   MRN: [id number]   PCP: Raliegh Ip, NP  Chief Complaint  Patient presents with  . Follow-up    6 month  . Constipation  . Insomnia    HPI  Ms. Katelyn Sparks has a past medical history of Osteoarthrosis, Esophageal Reflux, and Dementia. She is here today for follow up.  Current Status: She is doing well with no complaints. She is accompanied by her son today.  She denies fevers, chills, fatigue, recent infections, weight loss, and night sweats.   She has occasional dizziness when she ambulates quickly. She has not had any headaches, visual changes, and falls.   No chest pain, heart palpitations, cough and shortness of breath reported. She has occasional nausea and chronic constipation, but no reports of any other GI problems. She has no reports of blood in stools, dysuria and hematuria.   No depression or anxiety.   She has no pain today.    Past Medical History:  Diagnosis Date  . Blood in stool   . Dementia   . Esophageal reflux   . Flatulence, eructation, and gas pain   . Osteoarthrosis, unspecified whether generalized or localized, unspecified site   . Unspecified hemorrhoids without mention of complication     Family History  Problem Relation Age of Onset  . Hypertension Mother   . Diabetes Mother   . Diabetes Sister   . Diabetes Brother   . Diabetes Unknown   . Colon cancer Neg Hx   . Cancer Neg Hx   . Early death Neg Hx   . Heart disease Neg Hx   . Hyperlipidemia Neg Hx   . Kidney disease Neg Hx   . Learning disabilities Neg Hx   . Stroke Neg Hx     Social History   Socioeconomic History  . Marital status: Widowed    Spouse name: Not on file  . Number of children: 2  . Years of education: 31  . Highest education level: Not on file  Occupational History  . Occupation: Retired    Associate Professor: RETIRED  Social Needs  . Financial resource strain: Not on file  . Food  insecurity:    Worry: Not on file    Inability: Not on file  . Transportation needs:    Medical: Not on file    Non-medical: Not on file  Tobacco Use  . Smoking status: Never Smoker  . Smokeless tobacco: Never Used  Substance and Sexual Activity  . Alcohol use: No    Alcohol/week: 0.0 oz  . Drug use: No  . Sexual activity: Not Currently  Lifestyle  . Physical activity:    Days per week: Not on file    Minutes per session: Not on file  . Stress: Not on file  Relationships  . Social connections:    Talks on phone: Not on file    Gets together: Not on file    Attends religious service: Not on file    Active member of club or organization: Not on file    Attends meetings of clubs or organizations: Not on file    Relationship status: Not on file  . Intimate partner violence:    Fear of current or ex partner: Not on file    Emotionally abused: Not on file    Physically abused: Not on file    Forced sexual activity: Not on file  Other Topics  Concern  . Not on file  Social History Narrative   She is a retired from a tobacco company.   Her husband passed away in 2001.  She has two son.   Patient is right handed.   Her grandson lives with her.   Patient drinks 1 cup of caffeine daily    Past Surgical History:  Procedure Laterality Date  . ABDOMINAL HYSTERECTOMY    . COLONOSCOPY  11/2010  . UPPER GI ENDOSCOPY  11/2010    Immunization History  Administered Date(s) Administered  . Influenza Split 08/23/2012  . Influenza Whole 08/29/2010  . Influenza, High Dose Seasonal PF 09/11/2016  . Influenza,inj,Quad PF,6+ Mos 08/28/2013, 08/20/2014, 11/10/2015  . Pneumococcal Conjugate-13 08/28/2013  . Pneumococcal Polysaccharide-23 04/13/2015  . Tdap 08/28/2013    Current Meds  Medication Sig  . linaclotide (LINZESS) 145 MCG CAPS capsule Take 1 capsule (145 mcg total) by mouth daily.  . Omega-3 Fatty Acids (FISH OIL) 1000 MG CAPS Take by mouth.  Marland Kitchen. omeprazole (PRILOSEC) 40 MG  capsule Take 1 capsule (40 mg total) by mouth daily.     No Known Allergies  BP 128/70 (BP Location: Left Arm, Patient Position: Sitting, Cuff Size: Small)   Pulse 74   Temp 98 F (36.7 C) (Oral)   Ht 5\' 6"  (1.676 m)   Wt 135 lb (61.2 kg)   SpO2 100%   BMI 21.79 kg/m    Review of Systems  Constitutional: Negative.   HENT: Negative.   Eyes: Negative.   Respiratory: Negative.   Cardiovascular: Negative.   Gastrointestinal: Positive for nausea.  Endocrine: Negative.   Genitourinary: Negative.   Musculoskeletal: Negative.   Skin: Negative.   Allergic/Immunologic: Negative.   Neurological: Negative.   Hematological: Negative.   Psychiatric/Behavioral: Negative.    Objective:   Physical Exam  Constitutional: She is oriented to person, place, and time. She appears well-developed and well-nourished.  HENT:  Head: Normocephalic and atraumatic.  Right Ear: External ear normal.  Left Ear: External ear normal.  Nose: Nose normal.  Mouth/Throat: Oropharynx is clear and moist.  Eyes: Pupils are equal, round, and reactive to light. Conjunctivae and EOM are normal.  Neck: Normal range of motion. Neck supple.  Cardiovascular: Normal rate, regular rhythm, normal heart sounds and intact distal pulses.  Pulmonary/Chest: Effort normal and breath sounds normal.  Abdominal: Soft. Bowel sounds are normal.  Musculoskeletal: Normal range of motion.  Neurological: She is alert and oriented to person, place, and time.  Skin: Skin is warm and dry. Capillary refill takes less than 2 seconds.  Toenails are grossly thick, long, and fungus.   Psychiatric: She has a normal mood and affect. Her behavior is normal. Judgment and thought content normal.  Nursing note and vitals reviewed.  Assessment & Plan:   1. Screening for diabetes mellitus Hgb A1c is stable at 6.2 today. Urinalysis is negative for Ketones, Glucose, and Protein. - POCT urinalysis dipstick  2. Pre-diabetes Hbg A1c is stable  at 6.2 today.   - POCT glycosylated hemoglobin (Hb A1C) - Ambulatory referral to Podiatry  3. Urinary tract infection without hematuria, site unspecified - Urine Culture  4. Insomnia We will increase Trazodone to 100 mg every night at bedtime. Monitor.   5. Constipation We will send Rx for Senna to pharmacy today.   6. Nail fungus Toenails are grossly long. We will refer her to Podiatry today.   7. Follow up She will follow up in 1 month to re-evaluate effectiveness of Trazodone.  No orders of the defined types were placed in this encounter.   Raliegh Ip,  MSN, FNP-BC [facility] [address] [place], [place] [address] [phone]

## 2018-06-03 LAB — URINE CULTURE

## 2018-06-04 ENCOUNTER — Telehealth: Payer: Self-pay

## 2018-06-04 DIAGNOSIS — G4700 Insomnia, unspecified: Secondary | ICD-10-CM

## 2018-06-04 DIAGNOSIS — K5900 Constipation, unspecified: Secondary | ICD-10-CM

## 2018-06-04 MED ORDER — SENNA 8.6 MG PO TABS: 1.0000 | ORAL_TABLET | Freq: Every day | ORAL | 2 refills | Status: DC

## 2018-06-04 MED ORDER — TRAZODONE HCL 50 MG PO TABS: ORAL_TABLET | ORAL | 1 refills | Status: DC

## 2018-06-04 NOTE — Telephone Encounter (Signed)
Medication sent to pharamcy

## 2018-07-10 DIAGNOSIS — K5900 Constipation, unspecified: Secondary | ICD-10-CM | POA: Diagnosis not present

## 2018-07-10 DIAGNOSIS — R69 Illness, unspecified: Secondary | ICD-10-CM | POA: Diagnosis not present

## 2018-07-10 DIAGNOSIS — G4700 Insomnia, unspecified: Secondary | ICD-10-CM | POA: Diagnosis not present

## 2018-07-10 DIAGNOSIS — Z833 Family history of diabetes mellitus: Secondary | ICD-10-CM | POA: Diagnosis not present

## 2018-07-17 ENCOUNTER — Ambulatory Visit (INDEPENDENT_AMBULATORY_CARE_PROVIDER_SITE_OTHER): Payer: Medicare HMO | Admitting: Family Medicine

## 2018-07-17 ENCOUNTER — Encounter: Payer: Self-pay | Admitting: Family Medicine

## 2018-07-17 VITALS — BP 124/74 | HR 88 | Temp 97.8°F | Ht 66.0 in | Wt 135.0 lb

## 2018-07-17 DIAGNOSIS — R319 Hematuria, unspecified: Secondary | ICD-10-CM | POA: Diagnosis not present

## 2018-07-17 DIAGNOSIS — N390 Urinary tract infection, site not specified: Secondary | ICD-10-CM

## 2018-07-17 DIAGNOSIS — G4700 Insomnia, unspecified: Secondary | ICD-10-CM | POA: Diagnosis not present

## 2018-07-17 DIAGNOSIS — R69 Illness, unspecified: Secondary | ICD-10-CM | POA: Diagnosis not present

## 2018-07-17 DIAGNOSIS — F0390 Unspecified dementia without behavioral disturbance: Secondary | ICD-10-CM

## 2018-07-17 DIAGNOSIS — K5900 Constipation, unspecified: Secondary | ICD-10-CM | POA: Diagnosis not present

## 2018-07-17 DIAGNOSIS — Z09 Encounter for follow-up examination after completed treatment for conditions other than malignant neoplasm: Secondary | ICD-10-CM

## 2018-07-17 DIAGNOSIS — R8290 Unspecified abnormal findings in urine: Secondary | ICD-10-CM | POA: Diagnosis not present

## 2018-07-17 LAB — POCT URINALYSIS DIP (MANUAL ENTRY)
Glucose, UA: NEGATIVE mg/dL
Ketones, POC UA: NEGATIVE mg/dL
Nitrite, UA: NEGATIVE
Protein Ur, POC: NEGATIVE mg/dL
Spec Grav, UA: >=1.03 — AB (ref 1.010–1.025)
Urobilinogen, UA: 0.2 E.U./dL
pH, UA: 5.5 (ref 5.0–8.0)

## 2018-07-17 MED ORDER — SULFAMETHOXAZOLE-TRIMETHOPRIM 800-160 MG PO TABS: 1.0000 | ORAL_TABLET | Freq: Two times a day (BID) | ORAL | 0 refills | Status: DC

## 2018-07-17 NOTE — Patient Instructions (Signed)
Sulfamethoxazole; Trimethoprim, SMX-TMP oral suspension What is this medicine? SULFAMETHOXAZOLE; TRIMETHOPRIM or SMX-TMP (suhl fuh meth OK suh zohl; trye METH oh prim) is a combination of a sulfonamide antibiotic and a second antibiotic, trimethoprim. It is used to treat or prevent certain kinds of bacterial infections.It will not work for colds, flu, or other viral infections. This medicine may be used for other purposes; ask your health care provider or pharmacist if you have questions. COMMON BRAND NAME(S): Septra, Sulfatrim, Sulfatrim Pediatric, Sultrex Pediatric What should I tell my health care provider before I take this medicine? They need to know if you have any of these conditions: -anemia -asthma -being treated with anticonvulsants -if you frequently drink alcohol containing drinks -kidney disease -liver disease -low level of folic acid or glucose-6-phosphate dehydrogenase -poor nutrition or malabsorption -porphyria -severe allergies -thyroid disorder -an unusual or allergic reaction to sulfamethoxazole, trimethoprim, sulfa drugs, other medicines, foods, dyes, or preservatives -pregnant or trying to get pregnant -breast-feeding How should I use this medicine? Take this suspension by mouth. Follow the directions on the prescription label. Shake the bottle well before taking. Use a specially marked spoon or container to measure your medicine. Ask your pharmacist if you do not have one. Household spoons are not accurate. Take your doses at regular intervals. Do not take more medicine than directed. Talk to your pediatrician regarding the use of this medicine in children. Special care may be needed. While this drug may be prescribed for children as young as 2 months of age for selected conditions, precautions do apply. Overdosage: If you think you have taken too much of this medicine contact a poison control center or emergency room at once. NOTE: This medicine is only for you. Do not  share this medicine with others. What if I miss a dose? If you miss a dose, take it as soon as you can. If it is almost time for your next dose, take only that dose. Do not take double or extra doses. What may interact with this medicine? Do not take this medicine with any of the following medications -aminobenzoate potassium -dofetilide -metronidazole This medicine may also interact with the following medications -ACE inhibitors like benazepril, enalapril, lisinopril, and ramipril -birth control pills -cyclosporine -digoxin -diuretics -indomethacin -medicines for diabetes -methenamine -methotrexate -phenytoin -potassium supplements -pyrimethamine -sulfinpyrazone -tricyclic antidepressants -warfarin This list may not describe all possible interactions. Give your health care provider a list of all the medicines, herbs, non-prescription drugs, or dietary supplements you use. Also tell them if you smoke, drink alcohol, or use illegal drugs. Some items may interact with your medicine. What should I watch for while using this medicine? Tell your doctor or health care professional if your symptoms do not improve. Drink several glasses of water a day to reduce the risk of kidney problems. Do not treat diarrhea with over the counter products. Contact your doctor if you have diarrhea that lasts more than 2 days or if it is severe and watery. This medicine can make you more sensitive to the sun. Keep out of the sun. If you cannot avoid being in the sun, wear protective clothing and use a sunscreen. Do not use sun lamps or tanning beds/booths. What side effects may I notice from receiving this medicine? Side effects that you should report to your doctor or health care professional as soon as possible: -allergic reactions like skin rash or hives, swelling of the face, lips, or tongue -breathing problems -fever or chills, sore throat -irregular heartbeat, chest pain -  joint or muscle pain -pain  or difficulty passing urine -red pinpoint spots on skin -redness, blistering, peeling or loosening of the skin, including inside the mouth -unusual bleeding or bruising -unusual weakness or tiredness -yellowing of the eyes or skin Side effects that usually do not require medical attention (report to your doctor or health care professional if they continue or are bothersome): -diarrhea -dizziness -headache -loss of appetite -nausea, vomiting -nervousness This list may not describe all possible side effects. Call your doctor for medical advice about side effects. You may report side effects to FDA at 1-800-FDA-1088. Where should I keep my medicine? Keep out of the reach of children. Store at room temperature between 15 and 25 degrees C (59 and 77 degrees F). Protect from light and moisture. Throw away any unused medicine after the expiration date. NOTE: This sheet is a summary. It may not cover all possible information. If you have questions about this medicine, talk to your doctor, pharmacist, or health care provider.  2018 Elsevier/Gold Standard (2013-07-05 14:37:40)  

## 2018-07-17 NOTE — Progress Notes (Signed)
Follow Up  Subjective:    Patient ID: Katelyn Sparks, female    DOB: 01/18/1935, 83 y.o.   MRN: [id number]  Chief Complaint  Patient presents with  . Follow-up  . Insomnia   HPI  Katelyn Sparks has a past medical history of Osteoarthritis, Acid Reflux, and Dementia. She is here for follow up.   Current Status: Since her last office visit, she is doing well with no complaints. She is accompanied today by her son. She does not enjoy activities that she previously enjoyed.   She denies fevers, chills, fatigue, recent infections, weight loss, and night sweats. She has not had any headaches, visual changes, dizziness, and falls. No chest pain, heart palpitations, cough and shortness of breath reported. No reports of GI problems such as nausea, vomiting, diarrhea, and constipation. She has no reports of blood in stools, dysuria and hematuria. No depression or anxiety reported. She denies pain today.   Past Medical History:  Diagnosis Date  . Blood in stool   . Dementia   . Esophageal reflux   . Flatulence, eructation, and gas pain   . Osteoarthrosis, unspecified whether generalized or localized, unspecified site   . Unspecified hemorrhoids without mention of complication     Family History  Problem Relation Age of Onset  . Hypertension Mother   . Diabetes Mother   . Diabetes Sister   . Diabetes Brother   . Diabetes Unknown   . Colon cancer Neg Hx   . Cancer Neg Hx   . Early death Neg Hx   . Heart disease Neg Hx   . Hyperlipidemia Neg Hx   . Kidney disease Neg Hx   . Learning disabilities Neg Hx   . Stroke Neg Hx     Social History   Socioeconomic History  . Marital status: Widowed    Spouse name: Not on file  . Number of children: 2  . Years of education: 9312  . Highest education level: Not on file  Occupational History  . Occupation: Retired    Associate Professormployer: RETIRED  Social Needs  . Financial resource strain: Not on file  . Food insecurity:    Worry: Not on file   Inability: Not on file  . Transportation needs:    Medical: Not on file    Non-medical: Not on file  Tobacco Use  . Smoking status: Never Smoker  . Smokeless tobacco: Never Used  Substance and Sexual Activity  . Alcohol use: No    Alcohol/week: 0.0 oz  . Drug use: No  . Sexual activity: Not Currently  Lifestyle  . Physical activity:    Days per week: Not on file    Minutes per session: Not on file  . Stress: Not on file  Relationships  . Social connections:    Talks on phone: Not on file    Gets together: Not on file    Attends religious service: Not on file    Active member of club or organization: Not on file    Attends meetings of clubs or organizations: Not on file    Relationship status: Not on file  . Intimate partner violence:    Fear of current or ex partner: Not on file    Emotionally abused: Not on file    Physically abused: Not on file    Forced sexual activity: Not on file  Other Topics Concern  . Not on file  Social History Narrative   She is a retired from a tobacco  company.   Her husband passed away in 05/07/2000.  She has two son.   Patient is right handed.   Her grandson lives with her.   Patient drinks 1 cup of caffeine daily    Past Surgical History:  Procedure Laterality Date  . ABDOMINAL HYSTERECTOMY    . COLONOSCOPY  11/2010  . UPPER GI ENDOSCOPY  11/2010    Immunization History  Administered Date(s) Administered  . Influenza Split 08/23/2012  . Influenza Whole 08/29/2010  . Influenza, High Dose Seasonal PF 09/11/2016  . Influenza,inj,Quad PF,6+ Mos 08/28/2013, 08/20/2014, 11/10/2015  . Pneumococcal Conjugate-13 08/28/2013  . Pneumococcal Polysaccharide-23 04/13/2015  . Tdap 08/28/2013    Current Meds  Medication Sig  . linaclotide (LINZESS) 145 MCG CAPS capsule Take 1 capsule (145 mcg total) by mouth daily.  . Omega-3 Fatty Acids (FISH OIL) 1000 MG CAPS Take by mouth.  Marland Kitchen omeprazole (PRILOSEC) 40 MG capsule Take 1 capsule (40 mg total) by  mouth daily.  Marland Kitchen senna (SENOKOT) 8.6 MG TABS tablet Take 1 tablet (8.6 mg total) by mouth at bedtime.  . traZODone (DESYREL) 50 MG tablet Take 2 tablets (total of 100 mg) at bedtime.   No Known Allergies  BP 124/74 (BP Location: Left Arm, Patient Position: Sitting, Cuff Size: Large)   Pulse 88   Temp 97.8 F (36.6 C) (Oral)   Ht 5\' 6"  (1.676 m)   Wt 135 lb (61.2 kg)   SpO2 100%   BMI 21.79 kg/m    Review of Systems  Constitutional: Negative.   HENT: Negative.   Eyes: Negative.   Respiratory: Negative.   Cardiovascular: Negative.   Gastrointestinal: Negative.   Endocrine: Negative.   Genitourinary: Negative.   Musculoskeletal: Negative.   Skin: Negative.   Allergic/Immunologic: Negative.   Neurological: Negative.   Hematological: Negative.   Psychiatric/Behavioral: Negative.    Objective:   Physical Exam  Constitutional: She is oriented to person, place, and time. She appears well-developed and well-nourished.  HENT:  Head: Normocephalic and atraumatic.  Right Ear: External ear normal.  Left Ear: External ear normal.  Nose: Nose normal.  Mouth/Throat: Oropharynx is clear and moist.  Eyes: Pupils are equal, round, and reactive to light. Conjunctivae and EOM are normal.  Neck: Normal range of motion. Neck supple.  Cardiovascular: Normal rate, regular rhythm, normal heart sounds and intact distal pulses.  Pulmonary/Chest: Effort normal and breath sounds normal.  Abdominal: Soft. Bowel sounds are normal.  Musculoskeletal: Normal range of motion.  Neurological: She is alert and oriented to person, place, and time.  Skin: Skin is warm and dry. Capillary refill takes less than 2 seconds.  Psychiatric: She has a normal mood and affect. Her behavior is normal. Judgment and thought content normal.  Nursing note and vitals reviewed.  Assessment & Plan:   1. Dementia without behavioral disturbance, unspecified dementia type We will continue to monitor and possibly explore  possible medications for memory loss.   2. Insomnia, unspecified type Continue Trazodone as prescribed.   3. Urinary tract infection with hematuria, site unspecified - sulfamethoxazole-trimethoprim (BACTRIM DS,SEPTRA DS) 800-160 MG tablet; Take 1 tablet by mouth 2 (two) times daily.  Dispense: 14 tablet; Refill: 0  4. Abnormal urinalysis - Urine Culture  5. Constipation, unspecified constipation type Stable today. Continue Senna as prescribed.   6. Follow up She will follow up in 3 months.  - POCT urinalysis dipstick    Meds ordered this encounter  Medications  . sulfamethoxazole-trimethoprim (BACTRIM DS,SEPTRA DS) 800-160 MG  tablet    Sig: Take 1 tablet by mouth 2 (two) times daily.    Dispense:  14 tablet    Refill:  0   Raliegh Ip,  MSN, FNP-C Patient [facility] [address] [place], [place] [address] [phone]

## 2018-07-19 LAB — URINE CULTURE

## 2018-07-21 ENCOUNTER — Telehealth: Payer: Self-pay | Admitting: Family Medicine

## 2018-07-21 NOTE — Telephone Encounter (Signed)
Spoke with son (in [place]) about possibly initiating Namenda or Aricept for patient to aide in memory loss. Also discussed possibly initiating Gingko Biloba for treatment, which is a therapy for dementia with unproven benefits. We will further investigate possible financial assistance for Namenda.

## 2018-07-22 ENCOUNTER — Other Ambulatory Visit: Payer: Self-pay | Admitting: Family Medicine

## 2018-07-22 DIAGNOSIS — A498 Other bacterial infections of unspecified site: Secondary | ICD-10-CM

## 2018-07-22 MED ORDER — CEPHALEXIN 250 MG PO CAPS: 250.0000 mg | ORAL_CAPSULE | Freq: Four times a day (QID) | ORAL | 0 refills | Status: DC

## 2018-07-22 NOTE — Progress Notes (Signed)
Rx for Keflex to pharmacy today.  

## 2018-07-23 ENCOUNTER — Telehealth: Payer: Self-pay

## 2018-07-23 NOTE — Telephone Encounter (Signed)
Left a vm for patient to callback 

## 2018-07-23 NOTE — Telephone Encounter (Signed)
-----   Message from [name], FNP sent at 07/22/2018  7:10 PM EDT ----- Regarding: "Urine Culture Results" Katelyn SonCarrie,   Urine Culture in complete. Please inform patient that she should discontinue Septra and were have prescribed another stronger antibiotic for her. We have sent Rx for Keflex to pharmacy.    Thanks.

## 2018-07-25 NOTE — Telephone Encounter (Signed)
Patient notified

## 2018-07-25 NOTE — Telephone Encounter (Signed)
-----   Message from Natalie M Stroud, FNP sent at 07/22/2018  7:10 PM EDT ----- Regarding: "Urine Culture Results" Carrie,   Urine Culture in complete. Please inform patient that she should discontinue Septra and were have prescribed another stronger antibiotic for her. We have sent Rx for Keflex to pharmacy.    Thanks.  

## 2018-07-26 MED ORDER — CEPHALEXIN 500 MG PO CAPS: 500.0000 mg | ORAL_CAPSULE | Freq: Two times a day (BID) | ORAL | 0 refills | Status: AC

## 2018-08-10 ENCOUNTER — Telehealth: Payer: Self-pay

## 2018-08-10 NOTE — Telephone Encounter (Signed)
Patient son telephoned and stated that the patient completed the Bactrim for the urinary tract infection and is now having pain on urination.  Wanted to know if can send another RX or do they need to come in for culture?

## 2018-08-20 NOTE — Telephone Encounter (Signed)
-----   Message from Kallie Locks, FNP sent at 08/18/2018  9:06 PM EDT ----- Regarding: "Antibiotic" Lyla Son,   Please inform patient's son that new Rx for antibiotic Keflex was sent to pharmacy on 07/26/2018. I am not sure why it is not showing up on her med list. Please contact pharmacy to make sure they received Rx. She was on Septra prior to urine culture results.   Thank you!  :)

## 2018-08-20 NOTE — Telephone Encounter (Signed)
Left a vm for patient to callback 

## 2018-08-29 ENCOUNTER — Other Ambulatory Visit: Payer: Self-pay | Admitting: Family Medicine

## 2018-08-29 DIAGNOSIS — N390 Urinary tract infection, site not specified: Secondary | ICD-10-CM

## 2018-08-29 DIAGNOSIS — B351 Tinea unguium: Secondary | ICD-10-CM

## 2018-08-29 MED ORDER — CEPHALEXIN 500 MG PO CAPS: 500.0000 mg | ORAL_CAPSULE | Freq: Two times a day (BID) | ORAL | 0 refills | Status: AC

## 2018-08-29 MED ORDER — EFINACONAZOLE 10 % EX SOLN: CUTANEOUS | 1 refills | Status: DC

## 2018-08-29 NOTE — Telephone Encounter (Signed)
Patient son states that the Kelfex was not at the pharmacy and that they never received the toe nail fungus medication. Patient son is aware that you are out of office and the message will be routed to you.

## 2018-08-29 NOTE — Telephone Encounter (Signed)
Lyla SonCarrie,   Please inform patient that Rx was sent to [facility] on 07/26/2018 for 10-day supply. I [facility] [address] today. Family needs to pick Antibiotic Rx up asap or it will expire. Please instruct patient to clean toenails prior to usage of fungal solution. She should also make sure that her toenails are dry before applying. Make sure she also knows to wash her hands thoroughly, after using nail solution.    Thank you so much Lyla SonCarrie!

## 2018-08-29 NOTE — Progress Notes (Signed)
Rx for Keflex and Antifungal solution to [facility] [address] [facility] today.

## 2018-08-30 NOTE — Telephone Encounter (Signed)
Tried to contact patient no answer and vm is full 

## 2018-08-30 NOTE — Telephone Encounter (Signed)
Patient notified

## 2018-10-16 ENCOUNTER — Telehealth: Payer: Self-pay

## 2018-10-16 NOTE — Telephone Encounter (Signed)
Called and spoke with patients son. Reminded of appointment tomorrow, he states he will call back to reschedule if needed.

## 2018-10-17 ENCOUNTER — Other Ambulatory Visit: Payer: Self-pay | Admitting: Family Medicine

## 2018-10-17 ENCOUNTER — Encounter: Payer: Self-pay | Admitting: Family Medicine

## 2018-10-17 ENCOUNTER — Ambulatory Visit (INDEPENDENT_AMBULATORY_CARE_PROVIDER_SITE_OTHER): Payer: Medicare HMO | Admitting: Family Medicine

## 2018-10-17 VITALS — BP 106/72 | HR 88 | Temp 97.9°F | Ht 66.0 in | Wt 129.0 lb

## 2018-10-17 DIAGNOSIS — R413 Other amnesia: Secondary | ICD-10-CM | POA: Diagnosis not present

## 2018-10-17 DIAGNOSIS — B351 Tinea unguium: Secondary | ICD-10-CM

## 2018-10-17 DIAGNOSIS — L608 Other nail disorders: Secondary | ICD-10-CM

## 2018-10-17 DIAGNOSIS — Z09 Encounter for follow-up examination after completed treatment for conditions other than malignant neoplasm: Secondary | ICD-10-CM | POA: Diagnosis not present

## 2018-10-17 DIAGNOSIS — Z23 Encounter for immunization: Secondary | ICD-10-CM

## 2018-10-17 DIAGNOSIS — K219 Gastro-esophageal reflux disease without esophagitis: Secondary | ICD-10-CM

## 2018-10-17 DIAGNOSIS — R7303 Prediabetes: Secondary | ICD-10-CM

## 2018-10-17 LAB — POCT GLYCOSYLATED HEMOGLOBIN (HGB A1C): Hemoglobin A1C: 5.9 % — AB (ref 4.0–5.6)

## 2018-10-17 MED ORDER — OMEPRAZOLE 40 MG PO CPDR: 40.0000 mg | DELAYED_RELEASE_CAPSULE | Freq: Every day | ORAL | 3 refills | Status: DC

## 2018-10-17 MED ORDER — CICLOPIROX OLAMINE 0.77 % EX CREA: TOPICAL_CREAM | Freq: Two times a day (BID) | CUTANEOUS | 1 refills | Status: DC

## 2018-10-17 MED ORDER — TERBINAFINE HCL 250 MG PO TABS: 250.0000 mg | ORAL_TABLET | Freq: Every day | ORAL | 0 refills | Status: DC

## 2018-10-17 MED ORDER — EFINACONAZOLE 10 % EX SOLN: CUTANEOUS | 3 refills | Status: DC

## 2018-10-17 NOTE — Patient Instructions (Signed)
Fungal Nail Infection Fungal nail infection is a common fungal infection of the toenails or fingernails. This condition affects toenails more often than fingernails. More than one nail may be infected. The condition can be passed from person to person (is contagious). What are the causes? This condition is caused by a fungus. Several types of funguses can cause the infection. These funguses are common in moist and warm areas. If your hands or feet come into contact with the fungus, it may get into a crack in your fingernail or toenail and cause the infection. What increases the risk? The following factors may make you more likely to develop this condition:  Being female.  Having diabetes.  Being of older age.  Living with someone who has the fungus.  Walking barefoot in areas where the fungus thrives, such as showers or locker rooms.  Having poor circulation.  Wearing shoes and socks that cause your feet to sweat.  Having athlete's foot.  Having a nail injury or history of a recent nail surgery.  Having psoriasis.  Having a weak body defense system (immune system).  What are the signs or symptoms? Symptoms of this condition include:  A pale spot on the nail.  Thickening of the nail.  A nail that becomes yellow or brown.  A brittle or ragged nail edge.  A crumbling nail.  A nail that has lifted away from the nail bed.  How is this diagnosed? This condition is diagnosed with a physical exam. Your health care provider may take a scraping or clipping from your nail to test for the fungus. How is this treated? Mild infections do not need treatment. If you have significant nail changes, treatment may include:  Oral antifungal medicines. You may need to take the medicine for several weeks or several months, and you may not see the results for a long time. These medicines can cause side effects. Ask your health care provider what problems to watch for.  Antifungal nail polish  and nail cream. These may be used along with oral antifungal medicines.  Laser treatment of the nail.  Surgery to remove the nail. This may be needed for the most severe infections.  Treatment takes a long time, and the infection may come back. Follow these instructions at home: Medicines  Take or apply over-the-counter and prescription medicines only as told by your health care provider.  Ask your health care provider about using over-the-counter mentholated ointment on your nails. Lifestyle   Do not share personal items, such as towels or nail clippers.  Trim your nails often.  Wash and dry your hands and feet every day.  Wear absorbent socks, and change your socks frequently.  Wear shoes that allow air to circulate, such as sandals or canvas tennis shoes. Throw out old shoes.  Wear rubber gloves if you are working with your hands in wet areas.  Do not walk barefoot in shower rooms or locker rooms.  Do not use a nail salon that does not use clean instruments.  Do not use artificial nails. General instructions  Keep all follow-up visits as told by your health care provider. This is important.  Use antifungal foot powder on your feet and in your shoes. Contact a health care provider if: Your infection is not getting better or it is getting worse after several months. This information is not intended to replace advice given to you by your health care provider. Make sure you discuss any questions you have with your health   care provider. Document Released: 11/25/2000 Document Revised: 05/05/2016 Document Reviewed: 06/01/2015 Elsevier Interactive Patient Education  2018 Elsevier Inc.    Terbinafine tablets What is this medicine? TERBINAFINE (TER bin a feen) is an antifungal medicine. It is used to treat certain kinds of fungal or yeast infections. This medicine may be used for other purposes; ask your health care provider or pharmacist if you have questions. COMMON  BRAND NAME(S): Lamisil, Terbinex What should I tell my health care provider before I take this medicine? They need to know if you have any of these conditions: -drink alcoholic beverages -kidney disease -liver disease -an unusual or allergic reaction to terbinafine, other medicines, foods, dyes, or preservatives -pregnant or trying to get pregnant -breast-feeding How should I use this medicine? Take this medicine by mouth with a full glass of water. Follow the directions on the prescription label. You can take this medicine with food or on an empty stomach. Take your medicine at regular intervals. Do not take your medicine more often than directed. Do not skip doses or stop your medicine early even if you feel better. Do not stop taking except on your doctor's advice. Talk to your pediatrician regarding the use of this medicine in children. Special care may be needed. Overdosage: If you think you have taken too much of this medicine contact a poison control center or emergency room at once. NOTE: This medicine is only for you. Do not share this medicine with others. What if I miss a dose? If you miss a dose, take it as soon as you can. If it is almost time for your next dose, take only that dose. Do not take double or extra doses. What may interact with this medicine? Do not take this medicine with any of the following medications: -thioridazine This medicine may also interact with the following medications: -beta-blockers -caffeine -cimetidine -cyclosporine -medicines for depression, anxiety, or psychotic disturbances -medicines for fungal infections like fluconazole and ketoconazole -medicines for irregular heartbeat like amiodarone, flecainide and propafenone -rifampin -warfarin This list may not describe all possible interactions. Give your health care provider a list of all the medicines, herbs, non-prescription drugs, or dietary supplements you use. Also tell them if you smoke,  drink alcohol, or use illegal drugs. Some items may interact with your medicine. What should I watch for while using this medicine? Visit your doctor or health care provider regularly. Tell your doctor right away if you have nausea or vomiting, loss of appetite, stomach pain on your right upper side, yellow skin, dark urine, light stools, or are over tired. Some fungal infections need many weeks or months of treatment to cure. If you are taking this medicine for a long time, you will need to have important blood work done. What side effects may I notice from receiving this medicine? Side effects that you should report to your doctor or health care professional as soon as possible: -allergic reactions like skin rash or hives, swelling of the face, lips, or tongue -changes in vision -dark urine -fever or infection -general ill feeling or flu-like symptoms -light-colored stools -loss of appetite, nausea -redness, blistering, peeling or loosening of the skin, including inside the mouth -right upper belly pain -unusually weak or tired -yellowing of the eyes or skin Side effects that usually do not require medical attention (report to your doctor or health care professional if they continue or are bothersome): -changes in taste -diarrhea -hair loss -muscle or joint pain -stomach gas -stomach upset This list may   not describe all possible side effects. Call your doctor for medical advice about side effects. You may report side effects to FDA at 1-800-FDA-1088. Where should I keep my medicine? Keep out of the reach of children. Store at room temperature below 25 degrees C (77 degrees F). Protect from light. Throw away any unused medicine after the expiration date. NOTE: This sheet is a summary. It may not cover all possible information. If you have questions about this medicine, talk to your doctor, pharmacist, or health care provider.  2018 Elsevier/Gold Standard (2008-02-08 16:28:07)   

## 2018-10-17 NOTE — Progress Notes (Signed)
Follow Up  Subjective:    Patient ID: Katelyn Sparks, female    DOB: 02-27-1935, 83 y.o.   MRN: [id number]  Chief Complaint  Patient presents with  . Follow-up    3 month on chronic condition   HPI  Ms. Katelyn Sparks is a 83 year female with a past medical history of Osteoarthritis, Flatulence, Esophageal Reflux, and Dementia. She is here today for follow up assessment of chronic diseases.   Current Status: Since her last office visit, she is doing well with no complaints. She admits to mild memory loss. She continues to have problems with toenail fungus. She is accompanied today by her son.  She denies increased thirst, frequent urination, hunger, fatigue, blurred vision, excessive hunger, excessive thirst, weight gain, weight loss, and poor wound healing.   She denies fevers, chills, recent infections, weight loss, and night sweats. She has not had any headaches, visual changes, dizziness, and falls. No chest pain, heart palpitations, cough and shortness of breath reported. No reports of GI problems such as nausea, vomiting, diarrhea, and constipation. She has no reports of blood in stools, dysuria and hematuria. No depression or anxiety reported. She denies pain today.   Past Medical History:  Diagnosis Date  . Blood in stool   . Dementia (HCC)   . Esophageal reflux   . Flatulence, eructation, and gas pain   . Osteoarthrosis, unspecified whether generalized or localized, unspecified site   . Unspecified hemorrhoids without mention of complication     Family History  Problem Relation Age of Onset  . Hypertension Mother   . Diabetes Mother   . Diabetes Sister   . Diabetes Brother   . Diabetes Unknown   . Colon cancer Neg Hx   . Cancer Neg Hx   . Early death Neg Hx   . Heart disease Neg Hx   . Hyperlipidemia Neg Hx   . Kidney disease Neg Hx   . Learning disabilities Neg Hx   . Stroke Neg Hx     Social History   Socioeconomic History  . Marital status: Widowed    Spouse name:  Not on file  . Number of children: 2  . Years of education: 25  . Highest education level: Not on file  Occupational History  . Occupation: Retired    Associate Professor: RETIRED  Social Needs  . Financial resource strain: Not on file  . Food insecurity:    Worry: Not on file    Inability: Not on file  . Transportation needs:    Medical: Not on file    Non-medical: Not on file  Tobacco Use  . Smoking status: Never Smoker  . Smokeless tobacco: Never Used  Substance and Sexual Activity  . Alcohol use: No    Alcohol/week: 0.0 standard drinks  . Drug use: No  . Sexual activity: Not Currently  Lifestyle  . Physical activity:    Days per week: Not on file    Minutes per session: Not on file  . Stress: Not on file  Relationships  . Social connections:    Talks on phone: Not on file    Gets together: Not on file    Attends religious service: Not on file    Active member of club or organization: Not on file    Attends meetings of clubs or organizations: Not on file    Relationship status: Not on file  . Intimate partner violence:    Fear of current or ex partner: Not on file  Emotionally abused: Not on file    Physically abused: Not on file    Forced sexual activity: Not on file  Other Topics Concern  . Not on file  Social History Narrative   She is a retired from a tobacco company.   Her husband passed away in 05/02/00.  She has two son.   Patient is right handed.   Her grandson lives with her.   Patient drinks 1 cup of caffeine daily    Past Surgical History:  Procedure Laterality Date  . ABDOMINAL HYSTERECTOMY    . COLONOSCOPY  11/2010  . UPPER GI ENDOSCOPY  11/2010    Immunization History  Administered Date(s) Administered  . Influenza Split 08/23/2012  . Influenza Whole 08/29/2010  . Influenza, High Dose Seasonal PF 09/11/2016  . Influenza,inj,Quad PF,6+ Mos 08/28/2013, 08/20/2014, 11/10/2015, 10/17/2018  . Pneumococcal Conjugate-13 08/28/2013  . Pneumococcal  Polysaccharide-23 04/13/2015  . Tdap 08/28/2013    Current Meds  Medication Sig  . Omega-3 Fatty Acids (FISH OIL) 1000 MG CAPS Take by mouth.  Marland Kitchen omeprazole (PRILOSEC) 40 MG capsule Take 1 capsule (40 mg total) by mouth daily.  . [DISCONTINUED] omeprazole (PRILOSEC) 40 MG capsule Take 1 capsule (40 mg total) by mouth daily.   No Known Allergies  BP 106/72 (BP Location: Left Arm, Patient Position: Sitting, Cuff Size: Small)   Pulse 88   Temp 97.9 F (36.6 C)   Ht 5\' 6"  (1.676 m)   Wt 129 lb (58.5 kg)   SpO2 100%   BMI 20.82 kg/m   Review of Systems  Constitutional: Negative.   HENT: Negative.   Eyes: Negative.   Respiratory: Negative.   Cardiovascular: Negative.   Gastrointestinal: Negative.   Genitourinary: Negative.   Musculoskeletal: Negative.   Skin: Negative.   Allergic/Immunologic: Negative.   Neurological: Negative.   Hematological: Negative.   Psychiatric/Behavioral: Negative.    Objective:   Physical Exam  Constitutional: She is oriented to person, place, and time. She appears well-developed and well-nourished.  HENT:  Head: Normocephalic and atraumatic.  Eyes: Pupils are equal, round, and reactive to light. EOM are normal.  Neck: Normal range of motion. Neck supple.  Cardiovascular: Normal rate, regular rhythm, normal heart sounds and intact distal pulses.  Pulmonary/Chest: Effort normal and breath sounds normal.  Abdominal: Soft. Bowel sounds are normal.  Musculoskeletal: Normal range of motion.  Neurological: She is alert and oriented to person, place, and time.  Skin: Skin is warm and dry.  Psychiatric: She has a normal mood and affect. Her behavior is normal. Judgment and thought content normal.  Nursing note and vitals reviewed.  Assessment & Plan:   1. Memory changes Stable. Not worsening. She will re-start taking Vitamin B-12. We will continue to monitor.   2. Toenail fungus We will initiate Lamisil today. Refill Efinaconazole cream as  prescribed. Monitor.  - Efinaconazole 10 % SOLN; Apply once daily to affected toes.  Dispense: 4 mL; Refill: 3 - terbinafine (LAMISIL) 250 MG tablet; Take 1 tablet (250 mg total) by mouth daily.  Dispense: 30 tablet; Refill: 0  3. GERD without esophagitis - omeprazole (PRILOSEC) 40 MG capsule; Take 1 capsule (40 mg total) by mouth daily.  Dispense: 90 capsule; Refill: 3  4. Pre-diabetes Improved. Hgb A1c decreased at 5.9 today, from 6.2 on 06/01/2018. She will continue to decrease foods/beverages high in sugars and carbs and follow Heart Healthy or DASH diet. Increase physical activity to at least 30 minutes cardio exercise daily.  - POCT  glycosylated hemoglobin (Hb A1C) - POCT urinalysis dipstick  5. Need for immunization against influenza - Flu Vaccine QUAD 36+ mos IM  6. Follow up Follow up in 3 months.    Meds ordered this encounter  Medications  . Efinaconazole 10 % SOLN    Sig: Apply once daily to affected toes.    Dispense:  4 mL    Refill:  3  . omeprazole (PRILOSEC) 40 MG capsule    Sig: Take 1 capsule (40 mg total) by mouth daily.    Dispense:  90 capsule    Refill:  3  . terbinafine (LAMISIL) 250 MG tablet    Sig: Take 1 tablet (250 mg total) by mouth daily.    Dispense:  30 tablet    Refill:  0   Raliegh Ip,  MSN, FNP-C Patient [facility] [address] [place], [place] [address] [phone]

## 2019-01-04 ENCOUNTER — Other Ambulatory Visit: Payer: Self-pay | Admitting: Family Medicine

## 2019-01-04 NOTE — Progress Notes (Signed)
Letter excusing patient from [employer] written today.

## 2019-01-09 ENCOUNTER — Encounter: Payer: Self-pay | Admitting: Family Medicine

## 2019-01-09 ENCOUNTER — Ambulatory Visit (INDEPENDENT_AMBULATORY_CARE_PROVIDER_SITE_OTHER): Payer: Medicare HMO | Admitting: Family Medicine

## 2019-01-09 VITALS — BP 108/62 | HR 94 | Temp 98.0°F | Ht 66.0 in | Wt 124.2 lb

## 2019-01-09 DIAGNOSIS — R69 Illness, unspecified: Secondary | ICD-10-CM | POA: Diagnosis not present

## 2019-01-09 DIAGNOSIS — B351 Tinea unguium: Secondary | ICD-10-CM

## 2019-01-09 DIAGNOSIS — R413 Other amnesia: Secondary | ICD-10-CM | POA: Diagnosis not present

## 2019-01-09 DIAGNOSIS — F419 Anxiety disorder, unspecified: Secondary | ICD-10-CM

## 2019-01-09 DIAGNOSIS — Z09 Encounter for follow-up examination after completed treatment for conditions other than malignant neoplasm: Secondary | ICD-10-CM | POA: Diagnosis not present

## 2019-01-09 LAB — POCT URINALYSIS DIP (MANUAL ENTRY)
Bilirubin, UA: NEGATIVE
Blood, UA: NEGATIVE
Glucose, UA: NEGATIVE mg/dL
Ketones, POC UA: NEGATIVE mg/dL
Leukocytes, UA: NEGATIVE
Nitrite, UA: NEGATIVE
Protein Ur, POC: 30 mg/dL — AB
Spec Grav, UA: >=1.03 — AB (ref 1.010–1.025)
Urobilinogen, UA: 0.2 E.U./dL
pH, UA: 5.5 (ref 5.0–8.0)

## 2019-01-09 MED ORDER — CICLOPIROX OLAMINE 0.77 % EX CREA: TOPICAL_CREAM | Freq: Two times a day (BID) | CUTANEOUS | 2 refills | Status: DC

## 2019-01-09 MED ORDER — BUSPIRONE HCL 5 MG PO TABS: 5.0000 mg | ORAL_TABLET | Freq: Every day | ORAL | 2 refills | Status: DC

## 2019-01-09 MED ORDER — TERBINAFINE HCL 250 MG PO TABS: 250.0000 mg | ORAL_TABLET | Freq: Every day | ORAL | 2 refills | Status: DC

## 2019-01-09 NOTE — Progress Notes (Signed)
[facility]  Established Patient Office Visit  Subjective:  Patient ID: Katelyn Sparks, female    DOB: January 16, 1935  Age: 84 y.o. MRN: [id number]  CC:  Chief Complaint  Patient presents with  . Follow-up    toenails    HPI Katelyn Sparks is a 84 year old female who presents for follow up.   Past Medical History:  Diagnosis Date  . Blood in stool   . Dementia (HCC)   . Esophageal reflux   . Flatulence, eructation, and gas pain   . Osteoarthrosis, unspecified whether generalized or localized, unspecified site   . Unspecified hemorrhoids without mention of complication    Current Status: Since her last office visit, she is doing well with no complaints. She is accompanied today by her son. She continues to have memory loss. She does not sleep well. She takes frequent naps during the day.  She has anxiety and nausea, r/t her memory loss. She reports mild incidents of forgetting things. She denies suicidal ideations, homicidal ideations, or auditory hallucinations. She continues to have fungus in her toenails. No reports of GI problems such as nausea, vomiting, diarrhea, and constipation. She has no reports of blood in stools, dysuria and hematuria.   She denies fevers, chills, fatigue, recent infections, weight loss, and night sweats. She has not had any headaches, visual changes, dizziness, and falls. No chest pain, heart palpitations, cough and shortness of breath reported. She denies pain today.   Past Surgical History:  Procedure Laterality Date  . ABDOMINAL HYSTERECTOMY    . COLONOSCOPY  11/2010  . UPPER GI ENDOSCOPY  11/2010    Family History  Problem Relation Age of Onset  . Hypertension Mother   . Diabetes Mother   . Diabetes Sister   . Diabetes Brother   . Diabetes Unknown   . Colon cancer Neg Hx   . Cancer Neg Hx   . Early death Neg Hx   . Heart disease Neg Hx   . Hyperlipidemia Neg Hx   . Kidney disease Neg Hx     . Learning disabilities Neg Hx   . Stroke Neg Hx     Social History   Socioeconomic History  . Marital status: Widowed    Spouse name: Not on file  . Number of children: 2  . Years of education: 23  . Highest education level: Not on file  Occupational History  . Occupation: Retired    Associate Professor: RETIRED  Social Needs  . Financial resource strain: Not on file  . Food insecurity:    Worry: Not on file    Inability: Not on file  . Transportation needs:    Medical: Not on file    Non-medical: Not on file  Tobacco Use  . Smoking status: Never Smoker  . Smokeless tobacco: Never Used  Substance and Sexual Activity  . Alcohol use: No    Alcohol/week: 0.0 standard drinks  . Drug use: No  . Sexual activity: Not Currently  Lifestyle  . Physical activity:    Days per week: Not on file    Minutes per session: Not on file  . Stress: Not on file  Relationships  . Social connections:    Talks on phone: Not on file    Gets together: Not on file    Attends religious service: Not on file    Active member of club or organization: Not on file    Attends meetings  of clubs or organizations: Not on file    Relationship status: Not on file  . Intimate partner violence:    Fear of current or ex partner: Not on file    Emotionally abused: Not on file    Physically abused: Not on file    Forced sexual activity: Not on file  Other Topics Concern  . Not on file  Social History Narrative   She is a retired from a tobacco company.   Her husband passed away in 03/30/2000.  She has two son.   Patient is right handed.   Her grandson lives with her.   Patient drinks 1 cup of caffeine daily    Outpatient Medications Prior to Visit  Medication Sig Dispense Refill  . terbinafine (LAMISIL) 250 MG tablet Take 1 tablet (250 mg total) by mouth daily. 30 tablet 0  . Efinaconazole 10 % SOLN Apply once daily to affected toes. (Patient not taking: Reported on 01/09/2019) 4 mL 3  . linaclotide (LINZESS) 145  MCG CAPS capsule Take 1 capsule (145 mcg total) by mouth daily. (Patient not taking: Reported on 10/17/2018) 90 capsule 3  . Omega-3 Fatty Acids (FISH OIL) 1000 MG CAPS Take by mouth.    Marland Kitchen omeprazole (PRILOSEC) 40 MG capsule Take 1 capsule (40 mg total) by mouth daily. (Patient not taking: Reported on 01/09/2019) 90 capsule 3  . senna (SENOKOT) 8.6 MG TABS tablet Take 1 tablet (8.6 mg total) by mouth at bedtime. (Patient not taking: Reported on 10/17/2018) 30 each 2  . traZODone (DESYREL) 50 MG tablet Take 2 tablets (total of 100 mg) at bedtime. (Patient not taking: Reported on 10/17/2018) 60 tablet 1  . ciclopirox (LOPROX) 0.77 % cream Apply topically 2 (two) times daily. (Patient not taking: Reported on 01/09/2019) 30 g 1   No facility-administered medications prior to visit.     No Known Allergies  ROS Review of Systems  Constitutional: Negative.   HENT: Negative.   Eyes: Negative.   Respiratory: Negative.   Cardiovascular: Negative.   Gastrointestinal: Negative.   Endocrine: Negative.   Genitourinary: Negative.   Musculoskeletal: Negative.   Skin:       Fungal toenails  Allergic/Immunologic: Negative.   Neurological: Positive for weakness.       Memory loss  Hematological: Negative.   Psychiatric/Behavioral: The patient is nervous/anxious.    Objective:    Physical Exam  Constitutional: She is oriented to person, place, and time. She appears well-developed and well-nourished.  HENT:  Head: Normocephalic and atraumatic.  Eyes: Conjunctivae are normal.  Neck: Normal range of motion. Neck supple.  Cardiovascular: Normal rate, regular rhythm, normal heart sounds and intact distal pulses.  Pulmonary/Chest: Effort normal and breath sounds normal.  Abdominal: Soft. Bowel sounds are normal.  Musculoskeletal: Normal range of motion.  Neurological: She is alert and oriented to person, place, and time. She has normal reflexes.  Skin: Skin is warm and dry.  Psychiatric: She has a  normal mood and affect. Her behavior is normal. Judgment and thought content normal.  Nursing note and vitals reviewed.  BP 108/62 (BP Location: Left Arm, Patient Position: Sitting, Cuff Size: Small)   Pulse 94   Temp 98 F (36.7 C) (Oral)   Ht 5\' 6"  (1.676 m)   Wt 124 lb 3.2 oz (56.3 kg)   SpO2 97%   BMI 20.05 kg/m  Wt Readings from Last 3 Encounters:  01/09/19 124 lb 3.2 oz (56.3 kg)  10/17/18 129 lb (58.5 kg)  07/17/18 135 lb (61.2 kg)   Health Maintenance Due  Topic Date Due  . OPHTHALMOLOGY EXAM  04/13/1947  . URINE MICROALBUMIN  04/13/1947  . FOOT EXAM  09/20/2018    There are no preventive care reminders to display for this patient.  Lab Results  Component Value Date   TSH 1.55 03/06/2017   Lab Results  Component Value Date   WBC 3.7 (L) 03/06/2017   HGB 13.1 03/06/2017   HCT 37.0 03/06/2017   MCV 84.9 03/06/2017   PLT 217.0 03/06/2017   Lab Results  Component Value Date   NA 138 03/06/2017   K 4.2 03/06/2017   CO2 28 03/06/2017   GLUCOSE 133 (H) 03/06/2017   BUN 13 03/06/2017   CREATININE 0.88 03/06/2017   BILITOT 0.7 03/06/2017   ALKPHOS 65 03/06/2017   AST 18 03/06/2017   ALT 12 03/06/2017   PROT 7.7 03/06/2017   ALBUMIN 4.3 03/06/2017   CALCIUM 9.4 03/06/2017   GFR 79.53 03/06/2017   Lab Results  Component Value Date   CHOL 292 (H) 03/06/2017   Lab Results  Component Value Date   HDL 69.10 03/06/2017   Lab Results  Component Value Date   LDLCALC 207 (H) 03/06/2017   Lab Results  Component Value Date   TRIG 76.0 03/06/2017   Lab Results  Component Value Date   CHOLHDL 4 03/06/2017   Lab Results  Component Value Date   HGBA1C 5.9 (A) 10/17/2018   Assessment & Plan:   1. Memory changes Stable. We will continue to monitor.   2. Anxiety We will initiate Buspar today.  - busPIRone (BUSPAR) 5 MG tablet; Take 1 tablet (5 mg total) by mouth daily.  Dispense: 30 tablet; Refill: 2  3. Toenail fungus - terbinafine (LAMISIL) 250  MG tablet; Take 1 tablet (250 mg total) by mouth daily.  Dispense: 30 tablet; Refill: 2 - ciclopirox (LOPROX) 0.77 % cream; Apply topically 2 (two) times daily.  Dispense: 30 g; Refill: 2  4. Follow up She will follow up in 2 months.  - POCT urinalysis dipstick  Meds ordered this encounter  Medications  . terbinafine (LAMISIL) 250 MG tablet    Sig: Take 1 tablet (250 mg total) by mouth daily.    Dispense:  30 tablet    Refill:  2  . ciclopirox (LOPROX) 0.77 % cream    Sig: Apply topically 2 (two) times daily.    Dispense:  30 g    Refill:  2  . busPIRone (BUSPAR) 5 MG tablet    Sig: Take 1 tablet (5 mg total) by mouth daily.    Dispense:  30 tablet    Refill:  2   Raliegh IpNatalie Lenzi Marmo,  MSN, FNP-C [facility] [address]  [place], [place] [id number] [phone]  Problem List Items Addressed This Visit    None    Visit Diagnoses    Memory changes    -  Primary   Anxiety       Relevant Medications   busPIRone (BUSPAR) 5 MG tablet   Toenail fungus       Relevant Medications   terbinafine (LAMISIL) 250 MG tablet   ciclopirox (LOPROX) 0.77 % cream   Follow up       Relevant Orders   POCT urinalysis dipstick (Completed)      Meds ordered this encounter  Medications  . terbinafine (LAMISIL) 250 MG tablet    Sig: Take 1 tablet (250  mg total) by mouth daily.    Dispense:  30 tablet    Refill:  2  . ciclopirox (LOPROX) 0.77 % cream    Sig: Apply topically 2 (two) times daily.    Dispense:  30 g    Refill:  2  . busPIRone (BUSPAR) 5 MG tablet    Sig: Take 1 tablet (5 mg total) by mouth daily.    Dispense:  30 tablet    Refill:  2    Follow-up: Return in about 2 months (around 03/10/2019).    Kallie LocksNatalie M Loralei Radcliffe, FNP

## 2019-01-09 NOTE — Patient Instructions (Signed)
Generalized Anxiety Disorder, Adult Generalized anxiety disorder (GAD) is a mental health disorder. People with this condition constantly worry about everyday events. Unlike normal anxiety, worry related to GAD is not triggered by a specific event. These worries also do not fade or get better with time. GAD interferes with life functions, including relationships, work, and school. GAD can vary from mild to severe. People with severe GAD can have intense waves of anxiety with physical symptoms (panic attacks). What are the causes? The exact cause of GAD is not known. What increases the risk? This condition is more likely to develop in:  Women.  People who have a family history of anxiety disorders.  People who are very shy.  People who experience very stressful life events, such as the death of a loved one.  People who have a very stressful family environment. What are the signs or symptoms? People with GAD often worry excessively about many things in their lives, such as their health and family. They may also be overly concerned about:  Doing well at work.  Being on time.  Natural disasters.  Friendships. Physical symptoms of GAD include:  Fatigue.  Muscle tension or having muscle twitches.  Trembling or feeling shaky.  Being easily startled.  Feeling like your heart is pounding or racing.  Feeling out of breath or like you cannot take a deep breath.  Having trouble falling asleep or staying asleep.  Sweating.  Nausea, diarrhea, or irritable bowel syndrome (IBS).  Headaches.  Trouble concentrating or remembering facts.  Restlessness.  Irritability. How is this diagnosed? Your health care provider can diagnose GAD based on your symptoms and medical history. You will also have a physical exam. The health care provider will ask specific questions about your symptoms, including how severe they are, when they started, and if they come and go. Your health care  provider may ask you about your use of alcohol or drugs, including prescription medicines. Your health care provider may refer you to a mental health specialist for further evaluation. Your health care provider will do a thorough examination and may perform additional tests to rule out other possible causes of your symptoms. To be diagnosed with GAD, a person must have anxiety that:  Is out of his or her control.  Affects several different aspects of his or her life, such as work and relationships.  Causes distress that makes him or her unable to take part in normal activities.  Includes at least three physical symptoms of GAD, such as restlessness, fatigue, trouble concentrating, irritability, muscle tension, or sleep problems. Before your health care provider can confirm a diagnosis of GAD, these symptoms must be present more days than they are not, and they must last for six months or longer. How is this treated? The following therapies are usually used to treat GAD:  Medicine. Antidepressant medicine is usually prescribed for long-term daily control. Antianxiety medicines may be added in severe cases, especially when panic attacks occur.  Talk therapy (psychotherapy). Certain types of talk therapy can be helpful in treating GAD by providing support, education, and guidance. Options include: ? Cognitive behavioral therapy (CBT). People learn coping skills and techniques to ease their anxiety. They learn to identify unrealistic or negative thoughts and behaviors and to replace them with positive ones. ? Acceptance and commitment therapy (ACT). This treatment teaches people how to be mindful as a way to cope with unwanted thoughts and feelings. ? Biofeedback. This process trains you to manage your body's response (  physiological response) through breathing techniques and relaxation methods. You will work with a therapist while machines are used to monitor your physical symptoms.  Stress  management techniques. These include yoga, meditation, and exercise. A mental health specialist can help determine which treatment is best for you. Some people see improvement with one type of therapy. However, other people require a combination of therapies. Follow these instructions at home:  Take over-the-counter and prescription medicines only as told by your health care provider.  Try to maintain a normal routine.  Try to anticipate stressful situations and allow extra time to manage them.  Practice any stress management or self-calming techniques as taught by your health care provider.  Do not punish yourself for setbacks or for not making progress.  Try to recognize your accomplishments, even if they are small.  Keep all follow-up visits as told by your health care provider. This is important. Contact a health care provider if:  Your symptoms do not get better.  Your symptoms get worse.  You have signs of depression, such as: ? A persistently sad, cranky, or irritable mood. ? Loss of enjoyment in activities that used to bring you joy. ? Change in weight or eating. ? Changes in sleeping habits. ? Avoiding friends or family members. ? Loss of energy for normal tasks. ? Feelings of guilt or worthlessness. Get help right away if:  You have serious thoughts about hurting yourself or others. If you ever feel like you may hurt yourself or others, or have thoughts about taking your own life, get help right away. You can go to your nearest emergency department or call:  Your local emergency services (911 in the [place]  A suicide crisis helpline, such as the National Suicide Prevention Lifeline at 1-800-273-8255. This is open 24 hours a day. Summary  Generalized anxiety disorder (GAD) is a mental health disorder that involves worry that is not triggered by a specific event.  People with GAD often worry excessively about many things in their lives, such as their health and  family.  GAD may cause physical symptoms such as restlessness, trouble concentrating, sleep problems, frequent sweating, nausea, diarrhea, headaches, and trembling or muscle twitching.  A mental health specialist can help determine which treatment is best for you. Some people see improvement with one type of therapy. However, other people require a combination of therapies. This information is not intended to replace advice given to you by your health care provider. Make sure you discuss any questions you have with your health care provider. Document Released: 03/25/2013 Document Revised: 10/18/2016 Document Reviewed: 10/18/2016 Elsevier Interactive Patient Education  2019 Elsevier Inc.     Buspirone tablets What is this medicine? BUSPIRONE (byoo SPYE rone) is used to treat anxiety disorders. This medicine may be used for other purposes; ask your health care provider or pharmacist if you have questions. COMMON BRAND NAME(S): BuSpar What should I tell my health care provider before I take this medicine? They need to know if you have any of these conditions: -kidney or liver disease -an unusual or allergic reaction to buspirone, other medicines, foods, dyes, or preservatives -pregnant or trying to get pregnant -breast-feeding How should I use this medicine? Take this medicine by mouth with a glass of water. Follow the directions on the prescription label. You may take this medicine with or without food. To ensure that this medicine always works the same way for you, you should take it either always with or always without food. Take your   doses at regular intervals. Do not take your medicine more often than directed. Do not stop taking except on the advice of your doctor or health care professional. Talk to your pediatrician regarding the use of this medicine in children. Special care may be needed. Overdosage: If you think you have taken too much of this medicine contact a poison control  center or emergency room at once. NOTE: This medicine is only for you. Do not share this medicine with others. What if I miss a dose? If you miss a dose, take it as soon as you can. If it is almost time for your next dose, take only that dose. Do not take double or extra doses. What may interact with this medicine? Do not take this medicine with any of the following medications: -linezolid -MAOIs like Carbex, Eldepryl, Marplan, Nardil, and Parnate -methylene blue -procarbazine This medicine may also interact with the following medications: -diazepam -digoxin -diltiazem -erythromycin -grapefruit juice -haloperidol -medicines for mental depression or mood problems -medicines for seizures like carbamazepine, phenobarbital and phenytoin -nefazodone -other medications for anxiety -rifampin -ritonavir -some antifungal medicines like itraconazole, ketoconazole, and voriconazole -verapamil -warfarin This list may not describe all possible interactions. Give your health care provider a list of all the medicines, herbs, non-prescription drugs, or dietary supplements you use. Also tell them if you smoke, drink alcohol, or use illegal drugs. Some items may interact with your medicine. What should I watch for while using this medicine? Visit your doctor or health care professional for regular checks on your progress. It may take 1 to 2 weeks before your anxiety gets better. You may get drowsy or dizzy. Do not drive, use machinery, or do anything that needs mental alertness until you know how this drug affects you. Do not stand or sit up quickly, especially if you are an older patient. This reduces the risk of dizzy or fainting spells. Alcohol can make you more drowsy and dizzy. Avoid alcoholic drinks. What side effects may I notice from receiving this medicine? Side effects that you should report to your doctor or health care professional as soon as possible: -blurred vision or other vision  changes -chest pain -confusion -difficulty breathing -feelings of hostility or anger -muscle aches and pains -numbness or tingling in hands or feet -ringing in the ears -skin rash and itching -vomiting -weakness Side effects that usually do not require medical attention (report to your doctor or health care professional if they continue or are bothersome): -disturbed dreams, nightmares -headache -nausea -restlessness or nervousness -sore throat and nasal congestion -stomach upset This list may not describe all possible side effects. Call your doctor for medical advice about side effects. You may report side effects to FDA at 1-800-FDA-1088. Where should I keep my medicine? Keep out of the reach of children. Store at room temperature below 30 degrees C (86 degrees F). Protect from light. Keep container tightly closed. Throw away any unused medicine after the expiration date. NOTE: This sheet is a summary. It may not cover all possible information. If you have questions about this medicine, talk to your doctor, pharmacist, or health care provider.  2019 Elsevier/Gold Standard (2010-07-08 18:06:11)  

## 2019-03-12 ENCOUNTER — Ambulatory Visit: Payer: Medicare HMO | Admitting: Family Medicine

## 2019-03-13 DIAGNOSIS — S43005A Unspecified dislocation of left shoulder joint, initial encounter: Secondary | ICD-10-CM

## 2019-03-13 HISTORY — DX: Unspecified dislocation of left shoulder joint, initial encounter: S43.005A

## 2019-03-30 ENCOUNTER — Emergency Department (HOSPITAL_COMMUNITY): Payer: Medicare HMO

## 2019-03-30 ENCOUNTER — Emergency Department (HOSPITAL_COMMUNITY)
Admission: EM | Admit: 2019-03-30 | Discharge: 2019-03-30 | Disposition: A | Discharge status: Home / Self Care | Payer: Medicare HMO | Attending: Emergency Medicine | Admitting: Emergency Medicine

## 2019-03-30 ENCOUNTER — Other Ambulatory Visit: Payer: Self-pay

## 2019-03-30 ENCOUNTER — Encounter (HOSPITAL_COMMUNITY): Payer: Self-pay | Admitting: Emergency Medicine

## 2019-03-30 DIAGNOSIS — S43015A Anterior dislocation of left humerus, initial encounter: Secondary | ICD-10-CM

## 2019-03-30 DIAGNOSIS — W19XXXA Unspecified fall, initial encounter: Secondary | ICD-10-CM | POA: Diagnosis not present

## 2019-03-30 DIAGNOSIS — R51 Headache: Secondary | ICD-10-CM | POA: Diagnosis not present

## 2019-03-30 DIAGNOSIS — Z79899 Other long term (current) drug therapy: Secondary | ICD-10-CM | POA: Diagnosis not present

## 2019-03-30 DIAGNOSIS — R69 Illness, unspecified: Secondary | ICD-10-CM | POA: Diagnosis not present

## 2019-03-30 DIAGNOSIS — Y999 Unspecified external cause status: Secondary | ICD-10-CM | POA: Diagnosis not present

## 2019-03-30 DIAGNOSIS — Y92019 Unspecified place in single-family (private) house as the place of occurrence of the external cause: Secondary | ICD-10-CM | POA: Diagnosis not present

## 2019-03-30 DIAGNOSIS — S0990XA Unspecified injury of head, initial encounter: Secondary | ICD-10-CM | POA: Diagnosis not present

## 2019-03-30 DIAGNOSIS — M542 Cervicalgia: Secondary | ICD-10-CM | POA: Diagnosis not present

## 2019-03-30 DIAGNOSIS — S43012A Anterior subluxation of left humerus, initial encounter: Secondary | ICD-10-CM | POA: Diagnosis not present

## 2019-03-30 DIAGNOSIS — Y939 Activity, unspecified: Secondary | ICD-10-CM | POA: Diagnosis not present

## 2019-03-30 DIAGNOSIS — F0390 Unspecified dementia without behavioral disturbance: Secondary | ICD-10-CM | POA: Diagnosis not present

## 2019-03-30 DIAGNOSIS — S43005D Unspecified dislocation of left shoulder joint, subsequent encounter: Secondary | ICD-10-CM | POA: Diagnosis not present

## 2019-03-30 DIAGNOSIS — S199XXA Unspecified injury of neck, initial encounter: Secondary | ICD-10-CM | POA: Diagnosis not present

## 2019-03-30 DIAGNOSIS — S4992XA Unspecified injury of left shoulder and upper arm, initial encounter: Secondary | ICD-10-CM | POA: Diagnosis present

## 2019-03-30 LAB — BASIC METABOLIC PANEL
Anion gap: 15 (ref 5–15)
BUN: 20 mg/dL (ref 8–23)
CO2: 22 mmol/L (ref 22–32)
Calcium: 9.6 mg/dL (ref 8.9–10.3)
Chloride: 102 mmol/L (ref 98–111)
Creatinine, Ser: 0.91 mg/dL (ref 0.44–1.00)
GFR calc Af Amer: >60 mL/min (ref >60)
GFR calc non Af Amer: 59 mL/min — ABNORMAL LOW (ref >60)
Glucose, Bld: 178 mg/dL — ABNORMAL HIGH (ref 70–99)
Potassium: 3.6 mmol/L (ref 3.5–5.1)
Sodium: 139 mmol/L (ref 135–145)

## 2019-03-30 MED ORDER — LIDOCAINE HCL (PF) 1 % IJ SOLN
10.0000 mL | Freq: Once | INTRAMUSCULAR | Status: AC
  Administered 2019-03-30: 5 mL via INTRADERMAL
  Filled 2019-03-30: qty 10

## 2019-03-30 MED ORDER — FENTANYL CITRATE (PF) 100 MCG/2ML IJ SOLN
50.0000 ug | Freq: Once | INTRAMUSCULAR | Status: AC
  Administered 2019-03-30: 20:00:00 50 ug via INTRAVENOUS
  Filled 2019-03-30: qty 2

## 2019-03-30 MED ORDER — PROPOFOL 10 MG/ML IV BOLUS
60.0000 mg | Freq: Once | INTRAVENOUS | Status: DC
  Filled 2019-03-30: qty 20

## 2019-03-30 NOTE — ED Notes (Signed)
Patient verbalizes understanding of discharge instructions. Opportunity for questioning and answers were provided. Armband removed by staff, pt discharged from ED.  

## 2019-03-30 NOTE — ED Provider Notes (Signed)
[facility] EMERGENCY DEPARTMENT Provider Note   CSN: [id number] Arrival date & time: 03/30/19  1719    History   Chief Complaint No chief complaint on file.   HPI Katelyn Sparks is a 84 y.o. female.     HPI 84 year old woman with a past medical history of GERD, dementia presents to the emergency department after an unwitnessed fall when she was at her son's house.  Patient unable to explain the event but the son states that the patient was found in the den with a deformed left shoulder concerning for shoulder dislocation so he called the ambulance.  EMS noted that the patient was hemodynamically stable.  Patient not complaining of pain elsewhere.  Unsure if she hit her head or not.  Does not take blood thinners.  Otherwise has been in her normal state of health without changes in bowel or bladder habits, appetite and has not had any fevers or URI symptoms. Past Medical History:  Diagnosis Date  . Blood in stool   . Dementia (HCC)   . Esophageal reflux   . Flatulence, eructation, and gas pain   . Osteoarthrosis, unspecified whether generalized or localized, unspecified site   . Unspecified hemorrhoids without mention of complication     Patient Active Problem List   Diagnosis Date Noted  . Insomnia due to medical condition 08/20/2014  . Dementia arising in the senium and presenium (HCC) 08/20/2014  . Chronic constipation 11/12/2013  . Hyperglycemia 08/28/2013  . Hyperlipidemia with target LDL less than 130 08/28/2013  . Other screening mammogram 08/28/2013  . Osteopenia 08/28/2013  . Routine general medical examination at a health care facility 08/28/2013  . GERD without esophagitis 07/26/2010    Past Surgical History:  Procedure Laterality Date  . ABDOMINAL HYSTERECTOMY    . COLONOSCOPY  11/2010  . UPPER GI ENDOSCOPY  11/2010     OB History   No obstetric history on file.      Home Medications    Prior to Admission medications   Medication  Sig Start Date End Date Taking? Authorizing Provider  busPIRone (BUSPAR) 5 MG tablet Take 1 tablet (5 mg total) by mouth daily. 01/09/19   Kallie Locks, FNP  ciclopirox (LOPROX) 0.77 % cream Apply topically 2 (two) times daily. 01/09/19   Kallie Locks, FNP  Efinaconazole 10 % SOLN Apply once daily to affected toes. Patient not taking: Reported on 01/09/2019 10/17/18   Kallie Locks, FNP  linaclotide [facility]) 145 MCG CAPS capsule Take 1 capsule (145 mcg total) by mouth daily. Patient not taking: Reported on 10/17/2018 03/06/17   Etta Grandchild, MD  Omega-3 Fatty Acids (FISH OIL) 1000 MG CAPS Take by mouth.    [provider]  omeprazole (PRILOSEC) 40 MG capsule Take 1 capsule (40 mg total) by mouth daily. Patient not taking: Reported on 01/09/2019 10/17/18   Kallie Locks, FNP  senna (SENOKOT) 8.6 MG TABS tablet Take 1 tablet (8.6 mg total) by mouth at bedtime. Patient not taking: Reported on 10/17/2018 06/04/18   Kallie Locks, FNP  terbinafine (LAMISIL) 250 MG tablet Take 1 tablet (250 mg total) by mouth daily. 01/09/19   Kallie Locks, FNP  traZODone (DESYREL) 50 MG tablet Take 2 tablets (total of 100 mg) at bedtime. Patient not taking: Reported on 10/17/2018 06/04/18   Kallie Locks, FNP    Family History Family History  Problem Relation Age of Onset  . Hypertension Mother   .  Diabetes Mother   . Diabetes Sister   . Diabetes Brother   . Diabetes Unknown   . Colon cancer Neg Hx   . Cancer Neg Hx   . Early death Neg Hx   . Heart disease Neg Hx   . Hyperlipidemia Neg Hx   . Kidney disease Neg Hx   . Learning disabilities Neg Hx   . Stroke Neg Hx     Social History Social History   Tobacco Use  . Smoking status: Never Smoker  . Smokeless tobacco: Never Used  Substance Use Topics  . Alcohol use: No    Alcohol/week: 0.0 standard drinks  . Drug use: No     Allergies   Patient has no known allergies.   Review of Systems Review of Systems   Constitutional: Negative for chills and fever.  HENT: Negative for ear pain and sore throat.   Eyes: Negative for pain and visual disturbance.  Respiratory: Negative for cough and shortness of breath.   Cardiovascular: Negative for chest pain and palpitations.  Gastrointestinal: Negative for abdominal pain and vomiting.  Genitourinary: Negative for dysuria and hematuria.  Musculoskeletal: Positive for arthralgias (L shoulder). Negative for back pain.  Skin: Negative for color change and rash.  Neurological: Negative for seizures and syncope.  All other systems reviewed and are negative.    Physical Exam Updated Vital Signs There were no vitals taken for this visit.  Physical Exam Vitals signs and nursing note reviewed.  Constitutional:      General: She is not in acute distress.    Appearance: She is well-developed.  HENT:     Head: Normocephalic and atraumatic.     Comments: No signs of trauma to the head or the neck. Eyes:     Conjunctiva/sclera: Conjunctivae normal.  Neck:     Musculoskeletal: Neck supple.  Cardiovascular:     Rate and Rhythm: Normal rate and regular rhythm.     Heart sounds: No murmur.  Pulmonary:     Effort: Pulmonary effort is normal. No respiratory distress.     Breath sounds: Normal breath sounds.  Abdominal:     Palpations: Abdomen is soft.     Tenderness: There is no abdominal tenderness.  Musculoskeletal:        General: Deformity present.     Right lower leg: No edema.     Left lower leg: No edema.     Comments: Flattening of the left deltoid muscle consistent with anterior dislocation.  Bilateral radial pulses intact.  5 out of 5 grip strength in bilateral hands.  No loss of light touch sensation throughout the hands.  Skin:    General: Skin is warm and dry.  Neurological:     General: No focal deficit present.     Mental Status: She is alert. Mental status is at baseline.     Sensory: No sensory deficit.     Motor: No weakness.       ED Treatments / Results  Labs (all labs ordered are listed, but only abnormal results are displayed) Labs Reviewed - No data to display  EKG None  Radiology No results found.  Procedures Reduction of dislocation Date/Time: 03/30/2019 8:28 PM Performed by: Ina Kick, MD Authorized by: Ina Kick, MD  Consent: Verbal consent obtained. Consent given by: guardian Time out: Immediately prior to procedure a "time out" was called to verify the correct patient, procedure, equipment, support staff and site/side marked as required. Local anesthesia used: yes Anesthesia: local infiltration (  intraarticular lidocaine injection)  Anesthesia: Local anesthesia used: yes Local Anesthetic: lidocaine 1% without epinephrine Anesthetic total: 10 mL  Sedation: Patient sedated: no  Patient tolerance: Patient tolerated the procedure well with no immediate complications    (including critical care time)  Medications Ordered in ED Medications - No data to display   Initial Impression / Assessment and Plan / ED Course  I have reviewed the triage vital signs and the nursing notes.  Pertinent labs & imaging results that were available during my care of the patient were reviewed by me and considered in my medical decision making (see chart for details).         Well-appearing hemodynamically stable 84 year old woman presents to the emergency department and found to have left anterior humeral head dislocation.  Successful bedside reduction of the anterior dislocation using only 10 mL's of intra-articular lidocaine and slow downward traction with external rotation of the arm.  Patient tolerated this well.  Updated the family.  Patient stable for discharge at this time with a sling and follow-up with the PCP.  CT scan of the head and the neck were ordered and were negative for any acute findings.  Patient at her baseline.  No other signs of trauma.  Neurovascularly intact in both  hands after reduction. Final Clinical Impressions(s) / ED Diagnoses   Final diagnoses:  Anterior dislocation of left shoulder, initial encounter  Fall, initial encounter    ED Discharge Orders    None       Ina KickWestphal, Elektra Wartman, MD 03/30/19 [id number]    Blane OharaZavitz, Joshua, MD 04/08/19 [phone]

## 2019-03-30 NOTE — Discharge Instructions (Signed)
Please follow-up with your primary doctor next week for reevaluation of your shoulder.  They will decide if you need to have a specialist referral.

## 2019-03-30 NOTE — ED Triage Notes (Signed)
Pt states she fell in kitchen at 1600 today. Pt has deformity and pain to shoulder. Son was present. Unable to reach son via phone. Pt shaky and AO x 4.

## 2019-03-30 NOTE — ED Notes (Signed)
Pt family member out in the lobby area

## 2019-03-30 NOTE — ED Notes (Signed)
Patient transported to X-ray 

## 2019-04-01 ENCOUNTER — Encounter: Payer: Self-pay | Admitting: Family Medicine

## 2019-04-01 ENCOUNTER — Other Ambulatory Visit: Payer: Self-pay

## 2019-04-01 ENCOUNTER — Ambulatory Visit (INDEPENDENT_AMBULATORY_CARE_PROVIDER_SITE_OTHER): Payer: Medicare HMO | Admitting: Family Medicine

## 2019-04-01 VITALS — BP 126/82 | HR 86 | Temp 98.1°F | Ht 65.0 in | Wt 129.8 lb

## 2019-04-01 DIAGNOSIS — S43005A Unspecified dislocation of left shoulder joint, initial encounter: Secondary | ICD-10-CM | POA: Diagnosis not present

## 2019-04-01 DIAGNOSIS — Z9181 History of falling: Secondary | ICD-10-CM | POA: Diagnosis not present

## 2019-04-01 DIAGNOSIS — B351 Tinea unguium: Secondary | ICD-10-CM

## 2019-04-01 DIAGNOSIS — R413 Other amnesia: Secondary | ICD-10-CM | POA: Diagnosis not present

## 2019-04-01 DIAGNOSIS — Z131 Encounter for screening for diabetes mellitus: Secondary | ICD-10-CM

## 2019-04-01 DIAGNOSIS — Z09 Encounter for follow-up examination after completed treatment for conditions other than malignant neoplasm: Secondary | ICD-10-CM | POA: Diagnosis not present

## 2019-04-01 DIAGNOSIS — F419 Anxiety disorder, unspecified: Secondary | ICD-10-CM

## 2019-04-01 DIAGNOSIS — M25512 Pain in left shoulder: Secondary | ICD-10-CM

## 2019-04-01 DIAGNOSIS — S43005D Unspecified dislocation of left shoulder joint, subsequent encounter: Secondary | ICD-10-CM

## 2019-04-01 DIAGNOSIS — R69 Illness, unspecified: Secondary | ICD-10-CM | POA: Diagnosis not present

## 2019-04-01 LAB — POCT GLYCOSYLATED HEMOGLOBIN (HGB A1C): Hemoglobin A1C: 6.1 % — AB (ref 4.0–5.6)

## 2019-04-01 LAB — POCT URINALYSIS DIP (MANUAL ENTRY)
Bilirubin, UA: NEGATIVE
Blood, UA: NEGATIVE
Glucose, UA: NEGATIVE mg/dL
Ketones, POC UA: NEGATIVE mg/dL
Nitrite, UA: NEGATIVE
Protein Ur, POC: NEGATIVE mg/dL
Spec Grav, UA: 1.025 (ref 1.010–1.025)
Urobilinogen, UA: 0.2 E.U./dL
pH, UA: 5 (ref 5.0–8.0)

## 2019-04-01 MED ORDER — KETOROLAC TROMETHAMINE 30 MG/ML IJ SOLN
15.0000 mg | Freq: Once | INTRAMUSCULAR | Status: AC
  Administered 2019-04-01: 15 mg via INTRAMUSCULAR

## 2019-04-01 MED ORDER — TERBINAFINE HCL 250 MG PO TABS: 250.0000 mg | ORAL_TABLET | Freq: Every day | ORAL | 3 refills | Status: DC

## 2019-04-01 MED ORDER — KETOROLAC TROMETHAMINE 15 MG/ML IJ SOLN: 15.0000 mg | Freq: Once | INTRAMUSCULAR | Status: DC

## 2019-04-01 NOTE — Progress Notes (Signed)
[facility]  Hospital Follow Up   Subjective:  Patient ID: Katelyn Sparks, female    DOB: 06-18-1935  Age: 84 y.o. MRN: [id number]  CC:  Chief Complaint  Patient presents with  . Follow-up    chronic condtion   . Fall    HPI Katelyn Sparks is a 84 year old female who presents for Follow Up today.   Past Medical History:  Diagnosis Date  . Blood in stool   . Closed dislocation of left shoulder 03/2019  . Dementia (HCC)   . Esophageal reflux   . Flatulence, eructation, and gas pain   . Osteoarthrosis, unspecified whether generalized or localized, unspecified site   . Unspecified hemorrhoids without mention of complication    Current Status: Since her last ED visit for left should dislocation on 03/30/2019, because of a fall. She is unsure of what happened to her prior to fall. Today, she is doing well with c/o minimal left arm pain. Her pain is stable today at 7/10. She is accompanied by her son today. Her memory problems are stable today and have not worsened. She continues to have problems with sleeping. He anxiety is stable today.   She denies fevers, chills, fatigue, recent infections, weight loss, and night sweats. She has not had any headaches, visual changes, and dizziness. No chest pain, heart palpitations, cough and shortness of breath reported. No reports of GI problems such as nausea, vomiting, diarrhea, and constipation. She has no reports of blood in stools, dysuria and hematuria. No depression or anxiety reported.    Past Surgical History:  Procedure Laterality Date  . ABDOMINAL HYSTERECTOMY    . COLONOSCOPY  11/2010  . UPPER GI ENDOSCOPY  11/2010    Family History  Problem Relation Age of Onset  . Hypertension Mother   . Diabetes Mother   . Diabetes Sister   . Diabetes Brother   . Diabetes Other   . Colon cancer Neg Hx   . Cancer Neg Hx   . Early death Neg Hx   . Heart disease Neg Hx   . Hyperlipidemia  Neg Hx   . Kidney disease Neg Hx   . Learning disabilities Neg Hx   . Stroke Neg Hx     Social History   Socioeconomic History  . Marital status: Widowed    Spouse name: Not on file  . Number of children: 2  . Years of education: 46  . Highest education level: Not on file  Occupational History  . Occupation: Retired    Associate Professor: RETIRED  Social Needs  . Financial resource strain: Not on file  . Food insecurity:    Worry: Not on file    Inability: Not on file  . Transportation needs:    Medical: Not on file    Non-medical: Not on file  Tobacco Use  . Smoking status: Never Smoker  . Smokeless tobacco: Never Used  Substance and Sexual Activity  . Alcohol use: No    Alcohol/week: 0.0 standard drinks  . Drug use: No  . Sexual activity: Not Currently  Lifestyle  . Physical activity:    Days per week: Not on file    Minutes per session: Not on file  . Stress: Not on file  Relationships  . Social connections:    Talks on phone: Not on file    Gets together: Not on file    Attends religious service: Not on file  Active member of club or organization: Not on file    Attends meetings of clubs or organizations: Not on file    Relationship status: Not on file  . Intimate partner violence:    Fear of current or ex partner: Not on file    Emotionally abused: Not on file    Physically abused: Not on file    Forced sexual activity: Not on file  Other Topics Concern  . Not on file  Social History Narrative   She is a retired from a tobacco company.   Her husband passed away in 04-30-2000.  She has two son.   Patient is right handed.   Her grandson lives with her.   Patient drinks 1 cup of caffeine daily    Outpatient Medications Prior to Visit  Medication Sig Dispense Refill  . busPIRone (BUSPAR) 5 MG tablet Take 1 tablet (5 mg total) by mouth daily. 30 tablet 2  . Omega-3 Fatty Acids (FISH OIL) 1000 MG CAPS Take by mouth.    . terbinafine (LAMISIL) 250 MG tablet Take 1  tablet (250 mg total) by mouth daily. 30 tablet 2  . ciclopirox (LOPROX) 0.77 % cream Apply topically 2 (two) times daily. (Patient not taking: Reported on 03/30/2019) 30 g 2  . Efinaconazole 10 % SOLN Apply once daily to affected toes. (Patient not taking: Reported on 03/30/2019) 4 mL 3  . linaclotide (LINZESS) 145 MCG CAPS capsule Take 1 capsule (145 mcg total) by mouth daily. (Patient not taking: Reported on 03/30/2019) 90 capsule 3  . omeprazole (PRILOSEC) 40 MG capsule Take 1 capsule (40 mg total) by mouth daily. (Patient not taking: Reported on 03/30/2019) 90 capsule 3  . senna (SENOKOT) 8.6 MG TABS tablet Take 1 tablet (8.6 mg total) by mouth at bedtime. (Patient not taking: Reported on 03/30/2019) 30 each 2  . traZODone (DESYREL) 50 MG tablet Take 2 tablets (total of 100 mg) at bedtime. (Patient not taking: Reported on 03/30/2019) 60 tablet 1   No facility-administered medications prior to visit.     No Known Allergies  ROS Review of Systems  HENT: Negative.   Eyes: Negative.   Respiratory: Negative.   Cardiovascular: Negative.   Gastrointestinal: Negative.   Endocrine: Negative.   Genitourinary: Negative.   Musculoskeletal: Positive for arthralgias (Left shoulder pain).  Skin: Negative.   Allergic/Immunologic: Negative.   Neurological: Negative.   Hematological: Negative.   Psychiatric/Behavioral: Negative.       Objective:    Physical Exam  Constitutional: She is oriented to person, place, and time. She appears well-developed and well-nourished.  HENT:  Head: Normocephalic and atraumatic.  Eyes: Conjunctivae are normal.  Neck: Normal range of motion. Neck supple.  Cardiovascular: Normal rate, regular rhythm, normal heart sounds and intact distal pulses.  Pulmonary/Chest: Effort normal and breath sounds normal.  Abdominal: Soft. Bowel sounds are normal.  Musculoskeletal: Normal range of motion.  Neurological: She is alert and oriented to person, place, and time. She has  normal reflexes.  Skin: Skin is warm and dry.  Psychiatric: She has a normal mood and affect. Her behavior is normal. Thought content normal.  Nursing note and vitals reviewed.   BP 126/82 (BP Location: Right Arm, Patient Position: Sitting, Cuff Size: Small)   Pulse 86   Temp 98.1 F (36.7 C) (Oral)   Ht  (1.651 m)   Wt 129 lb 12.8 oz (58.9 kg)   SpO2 99%   BMI 21.60 kg/m  Wt Readings from Last  3 Encounters:  04/01/19 129 lb 12.8 oz (58.9 kg)  03/30/19 122 lb 2.2 oz (55.4 kg)  01/09/19 124 lb 3.2 oz (56.3 kg)     Health Maintenance Due  Topic Date Due  . OPHTHALMOLOGY EXAM  04/13/1947  . URINE MICROALBUMIN  04/13/1947  . FOOT EXAM  09/20/2018    There are no preventive care reminders to display for this patient.  Lab Results  Component Value Date   TSH 1.55 03/06/2017   Lab Results  Component Value Date   WBC 3.7 (L) 03/06/2017   HGB 13.1 03/06/2017   HCT 37.0 03/06/2017   MCV 84.9 03/06/2017   PLT 217.0 03/06/2017   Lab Results  Component Value Date   NA 139 03/30/2019   K 3.6 03/30/2019   CO2 22 03/30/2019   GLUCOSE 178 (H) 03/30/2019   BUN 20 03/30/2019   CREATININE 0.91 03/30/2019   BILITOT 0.7 03/06/2017   ALKPHOS 65 03/06/2017   AST 18 03/06/2017   ALT 12 03/06/2017   PROT 7.7 03/06/2017   ALBUMIN 4.3 03/06/2017   CALCIUM 9.6 03/30/2019   ANIONGAP 15 03/30/2019   GFR 79.53 03/06/2017   Lab Results  Component Value Date   CHOL 292 (H) 03/06/2017   Lab Results  Component Value Date   HDL 69.10 03/06/2017   Lab Results  Component Value Date   LDLCALC 207 (H) 03/06/2017   Lab Results  Component Value Date   TRIG 76.0 03/06/2017   Lab Results  Component Value Date   CHOLHDL 4 03/06/2017   Lab Results  Component Value Date   HGBA1C 6.1 (A) 04/01/2019      Assessment & Plan:   1. Hospital discharge follow-up  2. Closed dislocation of left shoulder, subsequent encounter Stable today. Increased pain in left shoulder.   3.  Acute pain of left shoulder She received pain injection in office today.  - ketorolac (TORADOL) 30 MG/ML injection 15 mg  4. History of recent fall  5. Screening for diabetes mellitus Hgb A1c is stable at 6.1. She will continue to decrease foods/beverages high in sugars and carbs and follow Heart Healthy or DASH diet. Increase physical activity to at least 30 minutes cardio exercise daily.  - POCT glycosylated hemoglobin (Hb A1C) - POCT urinalysis dipstick  6. Memory changes Stable. Not worsening.   7. Anxiety Moderated today r/t left shoulder pain.   8. Toenail fungus - terbinafine (LAMISIL) 250 MG tablet; Take 1 tablet (250 mg total) by mouth daily.  Dispense: 30 tablet; Refill: 3  9. Follow up She will follow up in 6 months.   Meds ordered this encounter  Medications  . terbinafine (LAMISIL) 250 MG tablet    Sig: Take 1 tablet (250 mg total) by mouth daily.    Dispense:  30 tablet    Refill:  3  . DISCONTD: ketorolac (TORADOL) 15 MG/ML injection 15 mg  . ketorolac (TORADOL) 30 MG/ML injection 15 mg    Orders Placed This Encounter  Procedures  . POCT glycosylated hemoglobin (Hb A1C)  . POCT urinalysis dipstick    Referral Orders  No referral(s) requested today    Raliegh IpNatalie Rmoni Keplinger,  MSN, FNP-C [facility] [address]  [place], [place] [id number] [phone]   Problem List Items Addressed This Visit    None    Visit Diagnoses    Hospital discharge follow-up    -  Primary   Closed dislocation of left shoulder, subsequent encounter  Acute pain of left shoulder       Relevant Medications   ketorolac (TORADOL) 30 MG/ML injection 15 mg (Completed)   History of recent fall       Screening for diabetes mellitus       Relevant Orders   POCT glycosylated hemoglobin (Hb A1C) (Completed)   POCT urinalysis dipstick (Completed)   Memory changes       Anxiety       Toenail fungus       Relevant Medications   terbinafine  (LAMISIL) 250 MG tablet   Follow up          Meds ordered this encounter  Medications  . terbinafine (LAMISIL) 250 MG tablet    Sig: Take 1 tablet (250 mg total) by mouth daily.    Dispense:  30 tablet    Refill:  3  . DISCONTD: ketorolac (TORADOL) 15 MG/ML injection 15 mg  . ketorolac (TORADOL) 30 MG/ML injection 15 mg    Follow-up: Return in about 6 months (around 10/01/2019).    Kallie Locks, FNP

## 2019-04-04 ENCOUNTER — Encounter: Payer: Self-pay | Admitting: Family Medicine

## 2019-04-04 DIAGNOSIS — Z9181 History of falling: Secondary | ICD-10-CM | POA: Insufficient documentation

## 2019-08-02 ENCOUNTER — Encounter: Payer: Self-pay | Admitting: Internal Medicine

## 2019-08-31 DIAGNOSIS — R69 Illness, unspecified: Secondary | ICD-10-CM | POA: Diagnosis not present

## 2019-09-24 ENCOUNTER — Other Ambulatory Visit: Payer: Self-pay

## 2019-09-24 ENCOUNTER — Emergency Department (HOSPITAL_COMMUNITY)
Admission: EM | Admit: 2019-09-24 | Discharge: 2019-09-25 | Disposition: A | Discharge status: Home / Self Care | Payer: Medicare HMO | Attending: Emergency Medicine | Admitting: Emergency Medicine

## 2019-09-24 ENCOUNTER — Emergency Department (HOSPITAL_COMMUNITY): Payer: Medicare HMO

## 2019-09-24 ENCOUNTER — Encounter (HOSPITAL_COMMUNITY): Payer: Self-pay | Admitting: Emergency Medicine

## 2019-09-24 DIAGNOSIS — R42 Dizziness and giddiness: Secondary | ICD-10-CM

## 2019-09-24 DIAGNOSIS — N3090 Cystitis, unspecified without hematuria: Secondary | ICD-10-CM | POA: Insufficient documentation

## 2019-09-24 DIAGNOSIS — N3000 Acute cystitis without hematuria: Secondary | ICD-10-CM | POA: Diagnosis not present

## 2019-09-24 DIAGNOSIS — F0390 Unspecified dementia without behavioral disturbance: Secondary | ICD-10-CM | POA: Insufficient documentation

## 2019-09-24 DIAGNOSIS — I959 Hypotension, unspecified: Secondary | ICD-10-CM | POA: Diagnosis not present

## 2019-09-24 DIAGNOSIS — R69 Illness, unspecified: Secondary | ICD-10-CM | POA: Diagnosis not present

## 2019-09-24 DIAGNOSIS — R0689 Other abnormalities of breathing: Secondary | ICD-10-CM | POA: Diagnosis not present

## 2019-09-24 DIAGNOSIS — R61 Generalized hyperhidrosis: Secondary | ICD-10-CM | POA: Diagnosis not present

## 2019-09-24 DIAGNOSIS — R1110 Vomiting, unspecified: Secondary | ICD-10-CM | POA: Diagnosis not present

## 2019-09-24 LAB — URINALYSIS, ROUTINE W REFLEX MICROSCOPIC
Bilirubin Urine: NEGATIVE
Glucose, UA: NEGATIVE mg/dL
Hgb urine dipstick: NEGATIVE
Ketones, ur: NEGATIVE mg/dL
Nitrite: NEGATIVE
Protein, ur: NEGATIVE mg/dL
Specific Gravity, Urine: 1.025 (ref 1.005–1.030)
pH: 5 (ref 5.0–8.0)

## 2019-09-24 LAB — CBC
HCT: 34.2 % — ABNORMAL LOW (ref 36.0–46.0)
Hemoglobin: 12.2 g/dL (ref 12.0–15.0)
MCH: 30.6 pg (ref 26.0–34.0)
MCHC: 35.7 g/dL (ref 30.0–36.0)
MCV: 85.7 fL (ref 80.0–100.0)
Platelets: 211 10*3/uL (ref 150–400)
RBC: 3.99 MIL/uL (ref 3.87–5.11)
RDW: 13.2 % (ref 11.5–15.5)
WBC: 6.2 10*3/uL (ref 4.0–10.5)
nRBC: 0 % (ref 0.0–0.2)

## 2019-09-24 LAB — BASIC METABOLIC PANEL
Anion gap: 10 (ref 5–15)
BUN: 24 mg/dL — ABNORMAL HIGH (ref 8–23)
CO2: 23 mmol/L (ref 22–32)
Calcium: 8.9 mg/dL (ref 8.9–10.3)
Chloride: 105 mmol/L (ref 98–111)
Creatinine, Ser: 0.9 mg/dL (ref 0.44–1.00)
GFR calc Af Amer: >60 mL/min (ref >60)
GFR calc non Af Amer: 60 mL/min — ABNORMAL LOW (ref >60)
Glucose, Bld: 163 mg/dL — ABNORMAL HIGH (ref 70–99)
Potassium: 4.1 mmol/L (ref 3.5–5.1)
Sodium: 138 mmol/L (ref 135–145)

## 2019-09-24 LAB — TROPONIN I (HIGH SENSITIVITY): Troponin I (High Sensitivity): 5 ng/L (ref <18)

## 2019-09-24 LAB — CBG MONITORING, ED: Glucose-Capillary: 162 mg/dL — ABNORMAL HIGH (ref 70–99)

## 2019-09-24 MED ORDER — SODIUM CHLORIDE 0.9% FLUSH: 3.0000 mL | Freq: Once | INTRAVENOUS | Status: DC

## 2019-09-24 MED ORDER — SODIUM CHLORIDE 0.9 % IV SOLN
1.0000 g | Freq: Once | INTRAVENOUS | Status: AC
  Administered 2019-09-24: 1 g via INTRAVENOUS
  Filled 2019-09-24: qty 10

## 2019-09-24 MED ORDER — SODIUM CHLORIDE 0.9 % IV BOLUS
1000.0000 mL | Freq: Once | INTRAVENOUS | Status: AC
  Administered 2019-09-24: 1000 mL via INTRAVENOUS

## 2019-09-24 NOTE — ED Notes (Signed)
Hx of Dementia

## 2019-09-24 NOTE — ED Triage Notes (Signed)
Pt brought from home via EMS d/t pt c/o dizziness, diaphoresis, vomiting... EMS reports when they arrived at the scene, pt reported that sx had subsided.  Pt denies CP, SOB  Pt reports no past medical hx and denies medication use.   Pt is alert to self and place. ... disoriented to time, situation

## 2019-09-25 LAB — TROPONIN I (HIGH SENSITIVITY): Troponin I (High Sensitivity): 7 ng/L (ref <18)

## 2019-09-25 MED ORDER — CEPHALEXIN 500 MG PO CAPS: 500.0000 mg | ORAL_CAPSULE | Freq: Two times a day (BID) | ORAL | 0 refills | Status: AC

## 2019-09-25 NOTE — ED Notes (Signed)
Discharge instructions discussed with patient son. Teach back demonstrated by son and opportunity given for questions.

## 2019-09-25 NOTE — ED Provider Notes (Signed)
Katelyn Sparks is a 84 y.o. female, presenting to the ED with dizziness. No complaints upon my interaction with patient.  HPI from [place], PA-C: "Katelyn Sparks is a 84 y.o. female with PMHx dementia who presents to the ED today for dizziness that lasted 5-10 minutes. Son is at bedside although he was not with pt during episode - he states he was told by another family member that pt became diaphoretic and began complaining of dizziness. She then vomited 1-2 times. Per triage report when EMS arrived on the scene her symptoms had subsided. Pt denies having any chest pain or shortness of breath during episode. She reports feeling back to baseline currently. Son reports pt is acting at baseline currently as well."  Past Medical History:  Diagnosis Date  . Blood in stool   . Closed dislocation of left shoulder 03/2019  . Dementia (HCC)   . Esophageal reflux   . Flatulence, eructation, and gas pain   . Osteoarthrosis, unspecified whether generalized or localized, unspecified site   . Unspecified hemorrhoids without mention of complication      Physical Exam  BP (!) 123/98   Pulse 69   Temp 98.3 F (36.8 C) (Oral)   Resp 18   Ht 5' 6.14" (1.68 m)   Wt 70.3 kg   SpO2 98%   BMI 24.91 kg/m   Physical Exam Vitals signs and nursing note reviewed.  Constitutional:      General: She is not in acute distress.    Appearance: She is well-developed. She is not diaphoretic.  HENT:     Head: Normocephalic and atraumatic.     Mouth/Throat:     Mouth: Mucous membranes are moist.     Pharynx: Oropharynx is clear.  Eyes:     Conjunctiva/sclera: Conjunctivae normal.  Neck:     Musculoskeletal: Neck supple.  Cardiovascular:     Rate and Rhythm: Normal rate and regular rhythm.     Pulses: Normal pulses.          Radial pulses are 2+ on the right side and 2+ on the left side.       Posterior tibial pulses are 2+ on the right side and 2+ on the left side.     Heart sounds: Normal heart  sounds.     Comments: Tactile temperature in the extremities appropriate and equal bilaterally. Pulmonary:     Effort: Pulmonary effort is normal. No respiratory distress.     Breath sounds: Normal breath sounds.  Abdominal:     Palpations: Abdomen is soft.     Tenderness: There is no abdominal tenderness. There is no guarding.  Musculoskeletal:     Right lower leg: No edema.     Left lower leg: No edema.  Lymphadenopathy:     Cervical: No cervical adenopathy.  Skin:    General: Skin is warm and dry.  Neurological:     Mental Status: She is alert.     Comments: Ambulated without onset of dizziness.  She needed no assistance during ambulation.  Psychiatric:        Mood and Affect: Mood and affect normal.        Speech: Speech normal.        Behavior: Behavior normal.     ED Course/Procedures     Procedures  Abnormal Labs Reviewed  BASIC METABOLIC PANEL - Abnormal; Notable for the following components:      Result Value   Glucose, Bld 163 (*)  BUN 24 (*)    GFR calc non Af Amer 60 (*)    All other components within normal limits  CBC - Abnormal; Notable for the following components:   HCT 34.2 (*)    All other components within normal limits  URINALYSIS, ROUTINE W REFLEX MICROSCOPIC - Abnormal; Notable for the following components:   APPearance HAZY (*)    Leukocytes,Ua MODERATE (*)    Bacteria, UA RARE (*)    All other components within normal limits  CBG MONITORING, ED - Abnormal; Notable for the following components:   Glucose-Capillary 162 (*)    All other components within normal limits   BUN  Date Value Ref Range Status  09/24/2019 24 (H) 8 - 23 mg/dL Final  03/30/2019 20 8 - 23 mg/dL Final  03/06/2017 13 6 - 23 mg/dL Final  11/10/2015 20 6 - 23 mg/dL Final   Creatinine, Ser  Date Value Ref Range Status  09/24/2019 0.90 0.44 - 1.00 mg/dL Final  03/30/2019 0.91 0.44 - 1.00 mg/dL Final  03/06/2017 0.88 0.40 - 1.20 mg/dL Final  11/10/2015 0.78 0.40 -  1.20 mg/dL Final   Dg Chest 2 View  Result Date: 09/24/2019 CLINICAL DATA:  Diaphoretic, vomiting EXAM: CHEST - 2 VIEW COMPARISON:  None. FINDINGS: The heart size and mediastinal contours are within normal limits. Aortic atherosclerosis. Both lungs are clear. Degenerative changes of the spine. IMPRESSION: No active cardiopulmonary disease. Electronically Signed   By: Donavan Foil M.D.   On: 09/24/2019 23:07    EKG Interpretation  Date/Time:  Tuesday September 24 2019 22:14:24 EDT Ventricular Rate:  69 PR Interval:    QRS Duration: 84 QT Interval:  413 QTC Calculation: 443 R Axis:   -39 Text Interpretation:  Sinus rhythm Borderline prolonged PR interval Left axis deviation Abnormal R-wave progression, late transition no prior available for comparison Confirmed by Quintella Reichert [phone]) on 09/25/2019 12:30:11 AM       MDM   Patient care handoff report received from [facility], [place]. Plan: Follow-up on second troponin.  Discharge.  Patient presented for evaluation following an episode of dizziness prior to arrival.  This episode resolved prior to arrival and did not recur.  Patient had no complaints during her ED course. Patient is nontoxic appearing, afebrile, not tachycardic, not tachypneic, not hypotensive, maintains excellent SPO2 on room air, and is in no apparent distress.  Evidence of possible UTI on UA.  Antibiotics initiated.  Urine culture pending. Delta troponins negative.  Chest x-ray unremarkable.  No noted difficulty with ambulation.  Vitals:   09/24/19 2200 09/24/19 2215 09/24/19 2230 09/24/19 2245  BP: 104/64 (!) 120/49 104/67 (!) 123/98  Pulse: 67 66 70 69  Resp: 10 (!) 24 18 18   Temp:      TempSrc:      SpO2: 100% 98% 100% 98%  Weight:      Height:          Lorayne Bender, PA-C 09/25/19 0454    Orpah Greek, MD 09/25/19 [phone]

## 2019-09-25 NOTE — Discharge Instructions (Signed)
Follow up with your PCP regarding your ED visit today Take antibiotics as prescribed for the UTI

## 2019-09-25 NOTE — ED Provider Notes (Addendum)
[facility] Provider Note   CSN: [id number] Arrival date & time: 09/24/19  2142     History   Chief Complaint Chief Complaint  Patient presents with  . Dizziness   LEVEL 5 CAVEAT - DEMENTIA  HPI Stephania Fragminnnie P Scarpelli is a 84 y.o. female with PMHx dementia who presents to the ED today for dizziness that lasted 5-10 minutes. Son is at bedside although he was not with pt during episode - he states he was told by another family member that pt became diaphoretic and began complaining of dizziness. She then vomited 1-2 times. Per triage report when EMS arrived on the scene her symptoms had subsided. Pt denies having any chest pain or shortness of breath during episode. She reports feeling back to baseline currently. Son reports pt is acting at baseline currently as well.       Past Medical History:  Diagnosis Date  . Blood in stool   . Closed dislocation of left shoulder 03/2019  . Dementia (HCC)   . Esophageal reflux   . Flatulence, eructation, and gas pain   . Osteoarthrosis, unspecified whether generalized or localized, unspecified site   . Unspecified hemorrhoids without mention of complication     Patient Active Problem List   Diagnosis Date Noted  . History of recent fall 04/04/2019  . Insomnia due to medical condition 08/20/2014  . Dementia arising in the senium and presenium (HCC) 08/20/2014  . Chronic constipation 11/12/2013  . Hyperglycemia 08/28/2013  . Hyperlipidemia with target LDL less than 130 08/28/2013  . Other screening mammogram 08/28/2013  . Osteopenia 08/28/2013  . Routine general medical examination at a health care facility 08/28/2013  . GERD without esophagitis 07/26/2010    Past Surgical History:  Procedure Laterality Date  . ABDOMINAL HYSTERECTOMY    . COLONOSCOPY  11/2010  . UPPER GI ENDOSCOPY  11/2010     OB History   No obstetric history on file.      Home Medications    Prior to Admission  medications   Medication Sig Start Date End Date Taking? Authorizing Provider  busPIRone (BUSPAR) 5 MG tablet Take 1 tablet (5 mg total) by mouth daily. 01/09/19   Kallie LocksStroud, Natalie M, FNP  cephALEXin (KEFLEX) 500 MG capsule Take 1 capsule (500 mg total) by mouth 2 (two) times daily for 7 days. 09/25/19 10/02/19  Tanda RockersVenter, Yigit Norkus, PA-C  ciclopirox (LOPROX) 0.77 % cream Apply topically 2 (two) times daily. Patient not taking: Reported on 03/30/2019 01/09/19   Kallie LocksStroud, Natalie M, FNP  Efinaconazole 10 % SOLN Apply once daily to affected toes. Patient not taking: Reported on 03/30/2019 10/17/18   Kallie LocksStroud, Natalie M, FNP  linaclotide [facility](LINZESS) 145 MCG CAPS capsule Take 1 capsule (145 mcg total) by mouth daily. Patient not taking: Reported on 03/30/2019 03/06/17   Etta GrandchildJones, Thomas L, MD  Omega-3 Fatty Acids (FISH OIL) 1000 MG CAPS Take by mouth.    [provider]  omeprazole (PRILOSEC) 40 MG capsule Take 1 capsule (40 mg total) by mouth daily. Patient not taking: Reported on 03/30/2019 10/17/18   Kallie LocksStroud, Natalie M, FNP  senna (SENOKOT) 8.6 MG TABS tablet Take 1 tablet (8.6 mg total) by mouth at bedtime. Patient not taking: Reported on 03/30/2019 06/04/18   Kallie LocksStroud, Natalie M, FNP  terbinafine (LAMISIL) 250 MG tablet Take 1 tablet (250 mg total) by mouth daily. 04/01/19   Kallie LocksStroud, Natalie M, FNP  traZODone (DESYREL) 50 MG tablet Take 2 tablets (total of 100  mg) at bedtime. Patient not taking: Reported on 03/30/2019 06/04/18   Azzie Glatter, FNP    Family History Family History  Problem Relation Age of Onset  . Hypertension Mother   . Diabetes Mother   . Diabetes Sister   . Diabetes Brother   . Diabetes Other   . Colon cancer Neg Hx   . Cancer Neg Hx   . Early death Neg Hx   . Heart disease Neg Hx   . Hyperlipidemia Neg Hx   . Kidney disease Neg Hx   . Learning disabilities Neg Hx   . Stroke Neg Hx     Social History Social History   Tobacco Use  . Smoking status: Never Smoker  . Smokeless  tobacco: Never Used  Substance Use Topics  . Alcohol use: No    Alcohol/week: 0.0 standard drinks  . Drug use: No     Allergies   Patient has no known allergies.   Review of Systems Review of Systems  Unable to perform ROS: Dementia  Constitutional: Positive for diaphoresis.  Gastrointestinal: Positive for vomiting.  Neurological: Positive for dizziness.     Physical Exam Updated Vital Signs BP (!) 123/98   Pulse 69   Temp 98.3 F (36.8 C) (Oral)   Resp 18   Ht 5' 6.14" (1.68 m)   Wt 70.3 kg   SpO2 98%   BMI 24.91 kg/m   Physical Exam Vitals signs and nursing note reviewed.  Constitutional:      Appearance: She is not ill-appearing or diaphoretic.  HENT:     Head: Normocephalic and atraumatic.     Right Ear: Tympanic membrane normal.     Left Ear: Tympanic membrane normal.  Eyes:     Extraocular Movements: Extraocular movements intact.     Conjunctiva/sclera: Conjunctivae normal.     Pupils: Pupils are equal, round, and reactive to light.  Neck:     Musculoskeletal: Neck supple.  Cardiovascular:     Rate and Rhythm: Normal rate and regular rhythm.     Pulses: Normal pulses.  Pulmonary:     Effort: Pulmonary effort is normal.     Breath sounds: Normal breath sounds. No wheezing, rhonchi or rales.  Abdominal:     Palpations: Abdomen is soft.     Tenderness: There is no abdominal tenderness. There is no guarding or rebound.  Musculoskeletal:     Right lower leg: No edema.     Left lower leg: No edema.  Skin:    General: Skin is warm and dry.  Neurological:     Mental Status: She is alert.     Comments: Alert. Oriented to self and place.  CN 3-12 grossly intact GCS 15 Sensation and strength intact Coordination with finger-to-nose WNL Neg pronator drift       ED Treatments / Results  Labs (all labs ordered are listed, but only abnormal results are displayed) Labs Reviewed  BASIC METABOLIC PANEL - Abnormal; Notable for the following components:       Result Value   Glucose, Bld 163 (*)    BUN 24 (*)    GFR calc non Af Amer 60 (*)    All other components within normal limits  CBC - Abnormal; Notable for the following components:   HCT 34.2 (*)    All other components within normal limits  URINALYSIS, ROUTINE W REFLEX MICROSCOPIC - Abnormal; Notable for the following components:   APPearance HAZY (*)    Leukocytes,Ua MODERATE (*)  Bacteria, UA RARE (*)    All other components within normal limits  CBG MONITORING, ED - Abnormal; Notable for the following components:   Glucose-Capillary 162 (*)    All other components within normal limits  URINE CULTURE  CBG MONITORING, ED  TROPONIN I (HIGH SENSITIVITY)  TROPONIN I (HIGH SENSITIVITY)    EKG EKG Interpretation  Date/Time:  Tuesday September 24 2019 22:14:24 EDT Ventricular Rate:  69 PR Interval:    QRS Duration: 84 QT Interval:  413 QTC Calculation: 443 R Axis:   -39 Text Interpretation:  Sinus rhythm Borderline prolonged PR interval Left axis deviation Abnormal R-wave progression, late transition no prior available for comparison Confirmed by Tilden Fossa [phone]) on 09/25/2019 12:30:11 AM   Radiology Dg Chest 2 View  Result Date: 09/24/2019 CLINICAL DATA:  Diaphoretic, vomiting EXAM: CHEST - 2 VIEW COMPARISON:  None. FINDINGS: The heart size and mediastinal contours are within normal limits. Aortic atherosclerosis. Both lungs are clear. Degenerative changes of the spine. IMPRESSION: No active cardiopulmonary disease. Electronically Signed   By: Jasmine Pang M.D.   On: 09/24/2019 23:07    Procedures Procedures (including critical care time)  Medications Ordered in ED Medications  sodium chloride flush (NS) 0.9 % injection 3 mL (has no administration in time range)  sodium chloride 0.9 % bolus 1,000 mL (1,000 mLs Intravenous New Bag/Given 09/24/19 2333)  cefTRIAXone (ROCEPHIN) 1 g in sodium chloride 0.9 % 100 mL IVPB (1 g Intravenous New Bag/Given 09/24/19  2338)     Initial Impression / Assessment and Plan / ED Course  I have reviewed the triage vital signs and the nursing notes.  Pertinent labs & imaging results that were available during my care of the patient were reviewed by me and considered in my medical decision making (see chart for details).    84 year old female presents the ED today complaining of brief episode of dizziness, diaphoresis, vomiting also 10 minutes.  When EMS arrived on scene patient stated that her symptoms had resolved.  Currently not complaining of any symptoms.  Denies chest pain or shortness of breath with episode.  Arrival patient's blood pressure noted to be 104/64; she does not take anything for high blood pressure.  Remainder of vital signs stable.  She is pleasantly demented and unable to give me much history.  She is alert to self and place.  No focal neuro deficits on exam today.  All extremities without difficulty.  Doubt stroke at this time.  Baseline blood work at this time including CBC, BMP, troponin, urinalysis obtain EKG and chest x-ray.   EKG without ischemic changes.  Chest x-ray clear.  No leukocytosis today.  Hemoglobin is stable.  No electrolyte abnormalities.  Initial troponin of 5. Will repeat.  Urinalysis with moderate leuks, 6-10 white blood cells per high-power field and hazy in appearance.  Will start Rocephin at this time.   Pending repeat troponin.  Have ambulated patient; she is able to ambulate without assistance.   1:07 AM At shift change case signed out to Harolyn Rutherford, [place], who will dispo patient accordingly. If repeat trop unchanged pt can be discharged home with close PCP follow up. Have sent home Rx for UTI.   This note was prepared using Dragon voice recognition software and may include unintentional dictation errors due to the inherent limitations of voice recognition software.       Final Clinical Impressions(s) / ED Diagnoses   Final diagnoses:  Dizziness  Acute cystitis  without hematuria  ED Discharge Orders         Ordered    cephALEXin (KEFLEX) 500 MG capsule  2 times daily     09/25/19 0145             Tanda Rockers, PA-C 09/25/19 0145    Tilden Fossa, MD 09/25/19 1233

## 2019-09-26 LAB — URINE CULTURE

## 2019-10-01 ENCOUNTER — Other Ambulatory Visit: Payer: Self-pay

## 2019-10-01 ENCOUNTER — Encounter: Payer: Self-pay | Admitting: Family Medicine

## 2019-10-01 ENCOUNTER — Ambulatory Visit (INDEPENDENT_AMBULATORY_CARE_PROVIDER_SITE_OTHER): Payer: Medicare HMO | Admitting: Family Medicine

## 2019-10-01 VITALS — BP 120/72 | HR 80 | Temp 97.5°F | Ht 66.0 in | Wt 128.4 lb

## 2019-10-01 DIAGNOSIS — R42 Dizziness and giddiness: Secondary | ICD-10-CM | POA: Diagnosis not present

## 2019-10-01 DIAGNOSIS — F0390 Unspecified dementia without behavioral disturbance: Secondary | ICD-10-CM

## 2019-10-01 DIAGNOSIS — R69 Illness, unspecified: Secondary | ICD-10-CM | POA: Diagnosis not present

## 2019-10-01 DIAGNOSIS — R413 Other amnesia: Secondary | ICD-10-CM | POA: Diagnosis not present

## 2019-10-01 DIAGNOSIS — Z09 Encounter for follow-up examination after completed treatment for conditions other than malignant neoplasm: Secondary | ICD-10-CM | POA: Diagnosis not present

## 2019-10-01 DIAGNOSIS — R7303 Prediabetes: Secondary | ICD-10-CM | POA: Diagnosis not present

## 2019-10-01 DIAGNOSIS — B351 Tinea unguium: Secondary | ICD-10-CM

## 2019-10-01 DIAGNOSIS — N390 Urinary tract infection, site not specified: Secondary | ICD-10-CM | POA: Diagnosis not present

## 2019-10-01 DIAGNOSIS — F419 Anxiety disorder, unspecified: Secondary | ICD-10-CM

## 2019-10-01 LAB — POCT GLYCOSYLATED HEMOGLOBIN (HGB A1C): Hemoglobin A1C: 5.8 % — AB (ref 4.0–5.6)

## 2019-10-01 LAB — POCT URINALYSIS DIPSTICK
Glucose, UA: NEGATIVE
Ketones, UA: NEGATIVE
Nitrite, UA: NEGATIVE
Protein, UA: NEGATIVE
Spec Grav, UA: >=1.03 — AB (ref 1.010–1.025)
Urobilinogen, UA: 0.2 E.U./dL
pH, UA: 5.5 (ref 5.0–8.0)

## 2019-10-01 LAB — GLUCOSE, POCT (MANUAL RESULT ENTRY): POC Glucose: 84 mg/dl (ref 70–99)

## 2019-10-01 MED ORDER — MEMANTINE HCL ER 28 MG PO CP24: 28.0000 mg | ORAL_CAPSULE | Freq: Every day | ORAL | 3 refills | Status: DC

## 2019-10-01 MED ORDER — BUSPIRONE HCL 5 MG PO TABS: 5.0000 mg | ORAL_TABLET | Freq: Every day | ORAL | 3 refills | Status: DC

## 2019-10-01 MED ORDER — TERBINAFINE HCL 250 MG PO TABS: 250.0000 mg | ORAL_TABLET | Freq: Every day | ORAL | 3 refills | Status: DC

## 2019-10-01 NOTE — Progress Notes (Signed)
[facility]   Hospital Follow Up   Subjective:  Patient ID: Katelyn Sparks, female    DOB: 12/07/1935  Age: 84 y.o. MRN: [id number]  CC:  Chief Complaint  Patient presents with  . Hospitalization Follow-up    uti   HPI Katelyn Sparks is a 84 year old female who presents for Hospital Follow Up today.   Past Medical History:  Diagnosis Date  . Blood in stool   . Closed dislocation of left shoulder 03/2019  . Dementia (HCC)   . Esophageal reflux   . Flatulence, eructation, and gas pain   . Osteoarthrosis, unspecified whether generalized or localized, unspecified site   . Unspecified hemorrhoids without mention of complication    Current Status: Since her last office visit, she is doing well with no complaints. She is accompanied by her son today. She continues to have memory deficits. She is currently not taking any medication for memory loss. She denies fevers, chills, fatigue, recent infections, weight loss, and night sweats. She has not had any headaches, visual changes, and dizziness.. No chest pain, heart palpitations, cough and shortness of breath reported. No reports of GI problems such as nausea, vomiting, diarrhea, and constipation. She has no reports of blood in stools, dysuria and hematuria. No depression or anxiety, and denies suicidal ideations, homicidal ideations, or auditory hallucinations. She denies pain today.   Past Surgical History:  Procedure Laterality Date  . ABDOMINAL HYSTERECTOMY    . COLONOSCOPY  11/2010  . UPPER GI ENDOSCOPY  11/2010    Family History  Problem Relation Age of Onset  . Hypertension Mother   . Diabetes Mother   . Diabetes Sister   . Diabetes Brother   . Diabetes Other   . Colon cancer Neg Hx   . Cancer Neg Hx   . Early death Neg Hx   . Heart disease Neg Hx   . Hyperlipidemia Neg Hx   . Kidney disease Neg Hx   . Learning disabilities Neg Hx   . Stroke Neg Hx     Social History    Socioeconomic History  . Marital status: Widowed    Spouse name: Not on file  . Number of children: 2  . Years of education: 1212  . Highest education level: Not on file  Occupational History  . Occupation: Retired    Associate Professormployer: RETIRED  Social Needs  . Financial resource strain: Not on file  . Food insecurity    Worry: Not on file    Inability: Not on file  . Transportation needs    Medical: Not on file    Non-medical: Not on file  Tobacco Use  . Smoking status: Never Smoker  . Smokeless tobacco: Never Used  Substance and Sexual Activity  . Alcohol use: No    Alcohol/week: 0.0 standard drinks  . Drug use: No  . Sexual activity: Not Currently  Lifestyle  . Physical activity    Days per week: Not on file    Minutes per session: Not on file  . Stress: Not on file  Relationships  . Social Musicianconnections    Talks on phone: Not on file    Gets together: Not on file    Attends religious service: Not on file    Active member of club or organization: Not on file    Attends meetings of clubs or organizations: Not on file    Relationship status: Not on file  .  Intimate partner violence    Fear of current or ex partner: Not on file    Emotionally abused: Not on file    Physically abused: Not on file    Forced sexual activity: Not on file  Other Topics Concern  . Not on file  Social History Narrative   She is a retired from a tobacco company.   Her husband passed away in 05-03-2000.  She has two son.   Patient is right handed.   Her grandson lives with her.   Patient drinks 1 cup of caffeine daily    Outpatient Medications Prior to Visit  Medication Sig Dispense Refill  . cephALEXin (KEFLEX) 500 MG capsule Take 1 capsule (500 mg total) by mouth 2 (two) times daily for 7 days. 14 capsule 0  . Omega-3 Fatty Acids (FISH OIL) 1000 MG CAPS Take by mouth.    Marland Kitchen omeprazole (PRILOSEC) 40 MG capsule Take 1 capsule (40 mg total) by mouth daily. 90 capsule 3  . ciclopirox (LOPROX) 0.77 %  cream Apply topically 2 (two) times daily. (Patient not taking: Reported on 03/30/2019) 30 g 2  . traZODone (DESYREL) 50 MG tablet Take 2 tablets (total of 100 mg) at bedtime. (Patient not taking: Reported on 03/30/2019) 60 tablet 1  . busPIRone (BUSPAR) 5 MG tablet Take 1 tablet (5 mg total) by mouth daily. (Patient not taking: Reported on 10/01/2019) 30 tablet 2  . Efinaconazole 10 % SOLN Apply once daily to affected toes. (Patient not taking: Reported on 03/30/2019) 4 mL 3  . linaclotide (LINZESS) 145 MCG CAPS capsule Take 1 capsule (145 mcg total) by mouth daily. (Patient not taking: Reported on 03/30/2019) 90 capsule 3  . senna (SENOKOT) 8.6 MG TABS tablet Take 1 tablet (8.6 mg total) by mouth at bedtime. (Patient not taking: Reported on 03/30/2019) 30 each 2  . terbinafine (LAMISIL) 250 MG tablet Take 1 tablet (250 mg total) by mouth daily. 30 tablet 3   No facility-administered medications prior to visit.     No Known Allergies  ROS Review of Systems  Constitutional: Negative.   HENT: Negative.   Eyes: Negative.   Respiratory: Negative.   Cardiovascular: Negative.   Gastrointestinal: Negative.   Endocrine: Negative.   Genitourinary: Negative.   Musculoskeletal: Positive for arthralgias (chronic left shoulder pain).  Skin:       Changes in toenails  Allergic/Immunologic: Negative.   Neurological: Negative.   Hematological: Negative.   Psychiatric/Behavioral:       Memory deficits      Objective:    Physical Exam  Constitutional: She is oriented to person, place, and time. She appears well-developed and well-nourished.  HENT:  Head: Normocephalic and atraumatic.  Eyes: Conjunctivae are normal.  Neck: Normal range of motion. Neck supple.  Cardiovascular: Normal rate, regular rhythm, normal heart sounds and intact distal pulses.  Pulmonary/Chest: Effort normal and breath sounds normal.  Abdominal: Soft. Bowel sounds are normal.  Musculoskeletal: Normal range of motion.   Neurological: She is alert and oriented to person, place, and time. She has normal reflexes.  Skin: Skin is warm and dry.  Psychiatric: She has a normal mood and affect. Her behavior is normal. Judgment normal.  Moderate memory deficits  Nursing note and vitals reviewed.   BP 120/72   Pulse 80   Temp (!) 97.5 F (36.4 C)   Ht  (1.676 m)   Wt 128 lb 6.4 oz (58.2 kg)   SpO2 100%   BMI 20.72 kg/m  Wt Readings from Last 3 Encounters:  10/01/19 128 lb 6.4 oz (58.2 kg)  09/24/19 155 lb (70.3 kg)  04/01/19 129 lb 12.8 oz (58.9 kg)     Health Maintenance Due  Topic Date Due  . OPHTHALMOLOGY EXAM  04/13/1947  . URINE MICROALBUMIN  04/13/1947  . FOOT EXAM  09/20/2018    There are no preventive care reminders to display for this patient.  Lab Results  Component Value Date   TSH 1.55 03/06/2017   Lab Results  Component Value Date   WBC 6.2 09/24/2019   HGB 12.2 09/24/2019   HCT 34.2 (L) 09/24/2019   MCV 85.7 09/24/2019   PLT 211 09/24/2019   Lab Results  Component Value Date   NA 138 09/24/2019   K 4.1 09/24/2019   CO2 23 09/24/2019   GLUCOSE 163 (H) 09/24/2019   BUN 24 (H) 09/24/2019   CREATININE 0.90 09/24/2019   BILITOT 0.7 03/06/2017   ALKPHOS 65 03/06/2017   AST 18 03/06/2017   ALT 12 03/06/2017   PROT 7.7 03/06/2017   ALBUMIN 4.3 03/06/2017   CALCIUM 8.9 09/24/2019   ANIONGAP 10 09/24/2019   GFR 79.53 03/06/2017   Lab Results  Component Value Date   CHOL 292 (H) 03/06/2017   Lab Results  Component Value Date   HDL 69.10 03/06/2017   Lab Results  Component Value Date   LDLCALC 207 (H) 03/06/2017   Lab Results  Component Value Date   TRIG 76.0 03/06/2017   Lab Results  Component Value Date   CHOLHDL 4 03/06/2017   Lab Results  Component Value Date   HGBA1C 5.8 (A) 10/01/2019      Assessment & Plan:   1. Hospital discharge follow-up  2. Urinary tract infection without hematuria, site unspecified  3. Dizziness Stable today.  She will ambulate careful upon changing positions.   4. Toenail fungus - Ambulatory referral to Podiatry - terbinafine (LAMISIL) 250 MG tablet; Take 1 tablet (250 mg total) by mouth daily.  Dispense: 30 tablet; Refill: 3  5. Dementia arising in the senium and presenium [facility]) We will initiate Namenda today.  - memantine (NAMENDA XR) 28 MG CP24 24 hr capsule; Take 1 capsule (28 mg total) by mouth daily.  Dispense: 90 capsule; Refill: 3  6. Anxiety We will initiate anti-anxiety medication today.  - busPIRone (BUSPAR) 5 MG tablet; Take 1 tablet (5 mg total) by mouth daily.  Dispense: 30 tablet; Refill: 3  7. Memory changes Positive deficits noted with Mini Memory Assessment Exam today. We will continue to monitor.   8. Pre-diabetes Hgb A1c is stable today at 5.8 today.  She will continue medication as prescribed, to decrease foods/beverages high in sugars and carbs and follow Heart Healthy or DASH diet. Increase physical activity to at least 30 minutes cardio exercise daily.  - POCT glycosylated hemoglobin (Hb A1C) - POCT urinalysis dipstick - POCT glucose (manual entry)  9. Follow up She will follow up for Urinalysis in 1 week.   Meds ordered this encounter  Medications  . memantine (NAMENDA XR) 28 MG CP24 24 hr capsule    Sig: Take 1 capsule (28 mg total) by mouth daily.    Dispense:  90 capsule    Refill:  3  . busPIRone (BUSPAR) 5 MG tablet    Sig: Take 1 tablet (5 mg total) by mouth daily.    Dispense:  30 tablet    Refill:  3  . terbinafine (LAMISIL) 250 MG tablet  Sig: Take 1 tablet (250 mg total) by mouth daily.    Dispense:  30 tablet    Refill:  3    Orders Placed This Encounter  Procedures  . Ambulatory referral to Podiatry  . POCT glycosylated hemoglobin (Hb A1C)  . POCT urinalysis dipstick  . POCT glucose (manual entry)     Referral Orders     Ambulatory referral to Podiatry   Raliegh Ip,  MSN, FNP-BC [facility] [address] [place], [place] [address] [phone] fax  Problem List Items Addressed This Visit      Nervous and Auditory   Dementia arising in the senium and presenium (HCC)   Relevant Medications   memantine (NAMENDA XR) 28 MG CP24 24 hr capsule   busPIRone (BUSPAR) 5 MG tablet    Other Visit Diagnoses    Hospital discharge follow-up    -  Primary   Urinary tract infection without hematuria, site unspecified       Relevant Medications   terbinafine (LAMISIL) 250 MG tablet   Dizziness       Toenail fungus       Relevant Medications   terbinafine (LAMISIL) 250 MG tablet   Other Relevant Orders   Ambulatory referral to Podiatry   Anxiety       Relevant Medications   busPIRone (BUSPAR) 5 MG tablet   Memory changes       Pre-diabetes       Relevant Orders   POCT glycosylated hemoglobin (Hb A1C) (Completed)   POCT urinalysis dipstick (Completed)   POCT glucose (manual entry) (Completed)   Follow up          Meds ordered this encounter  Medications  . memantine (NAMENDA XR) 28 MG CP24 24 hr capsule    Sig: Take 1 capsule (28 mg total) by mouth daily.    Dispense:  90 capsule    Refill:  3  . busPIRone (BUSPAR) 5 MG tablet    Sig: Take 1 tablet (5 mg total) by mouth daily.    Dispense:  30 tablet    Refill:  3  . terbinafine (LAMISIL) 250 MG tablet    Sig: Take 1 tablet (250 mg total) by mouth daily.    Dispense:  30 tablet    Refill:  3    Follow-up: Return in about 6 months (around 03/31/2020).    Kallie Locks, FNP

## 2019-10-02 DIAGNOSIS — F419 Anxiety disorder, unspecified: Secondary | ICD-10-CM | POA: Insufficient documentation

## 2019-10-02 DIAGNOSIS — R413 Other amnesia: Secondary | ICD-10-CM | POA: Insufficient documentation

## 2019-10-02 DIAGNOSIS — B351 Tinea unguium: Secondary | ICD-10-CM | POA: Insufficient documentation

## 2019-10-02 DIAGNOSIS — R7303 Prediabetes: Secondary | ICD-10-CM | POA: Insufficient documentation

## 2019-10-08 ENCOUNTER — Other Ambulatory Visit (INDEPENDENT_AMBULATORY_CARE_PROVIDER_SITE_OTHER): Payer: Medicare HMO

## 2019-10-08 ENCOUNTER — Other Ambulatory Visit: Payer: Self-pay

## 2019-10-08 DIAGNOSIS — N390 Urinary tract infection, site not specified: Secondary | ICD-10-CM | POA: Diagnosis not present

## 2019-10-08 LAB — POCT URINALYSIS DIPSTICK
Glucose, UA: NEGATIVE
Ketones, UA: NEGATIVE
Leukocytes, UA: NEGATIVE
Nitrite, UA: NEGATIVE
Protein, UA: POSITIVE — AB
Spec Grav, UA: >=1.03 — AB (ref 1.010–1.025)
Urobilinogen, UA: 0.2 E.U./dL
pH, UA: 5.5 (ref 5.0–8.0)

## 2019-10-10 ENCOUNTER — Ambulatory Visit: Payer: Medicare Other | Admitting: Podiatry

## 2019-10-21 ENCOUNTER — Other Ambulatory Visit: Payer: Self-pay

## 2019-10-21 ENCOUNTER — Ambulatory Visit: Payer: Medicare Other | Admitting: Podiatry

## 2019-10-21 ENCOUNTER — Encounter: Payer: Self-pay | Admitting: Podiatry

## 2019-10-21 VITALS — BP 137/76

## 2019-10-21 DIAGNOSIS — M79675 Pain in left toe(s): Secondary | ICD-10-CM

## 2019-10-21 DIAGNOSIS — B351 Tinea unguium: Secondary | ICD-10-CM | POA: Diagnosis not present

## 2019-10-21 DIAGNOSIS — M79674 Pain in right toe(s): Secondary | ICD-10-CM | POA: Diagnosis not present

## 2019-10-21 DIAGNOSIS — L600 Ingrowing nail: Secondary | ICD-10-CM

## 2019-10-24 NOTE — Progress Notes (Signed)
Subjective:   Patient ID: Katelyn Sparks, female   DOB: 84 y.o.   MRN: [id number]   HPI Patient presents with severe nail disease 1-5 both feet that she cannot take care of herself at also concerns about general foot structure and has questions concerning.  Patient does not smoke likes to be active   Review of Systems  All other systems reviewed and are negative.       Objective:  Physical Exam Vitals signs and nursing note reviewed.  Constitutional:      Appearance: She is well-developed.  Pulmonary:     Effort: Pulmonary effort is normal.  Musculoskeletal: Normal range of motion.  Skin:    General: Skin is warm.  Neurological:     Mental Status: She is alert.     Neurovascular status was found to be intact with patient noted to have elongated nailbeds 1-5 both feet that are thick yellow brittle and she cannot take care of.  She also has mild discomfort in her feet in general and history of hyperglycemia and does have good digital perfusion noted     Assessment:  Mycotic nail infection with pain 1-5 both feet along with hyperglycemia and mild foot structural issues     Plan:  H&P reviewed conditions and today I went ahead and debrided nailbeds 1-5 both feet which should be done routinely to prevent further problems and advised her on shoe gear choices modifications.  Patient will be seen back for routine visits or earlier if any issues were to occur

## 2019-11-05 IMAGING — DX LEFT SHOULDER - 1 VIEW
1 series · 2 of 2 positions shown · non-contrast
Comparison: 1725 hours today.

CLINICAL DATA: 83-year-old female status post reduction.

EXAM:
LEFT SHOULDER - 1 VIEW

[Series 1: shoulder · 0.14mm/px · 2 of 2 slices shown]
[im 1/2]
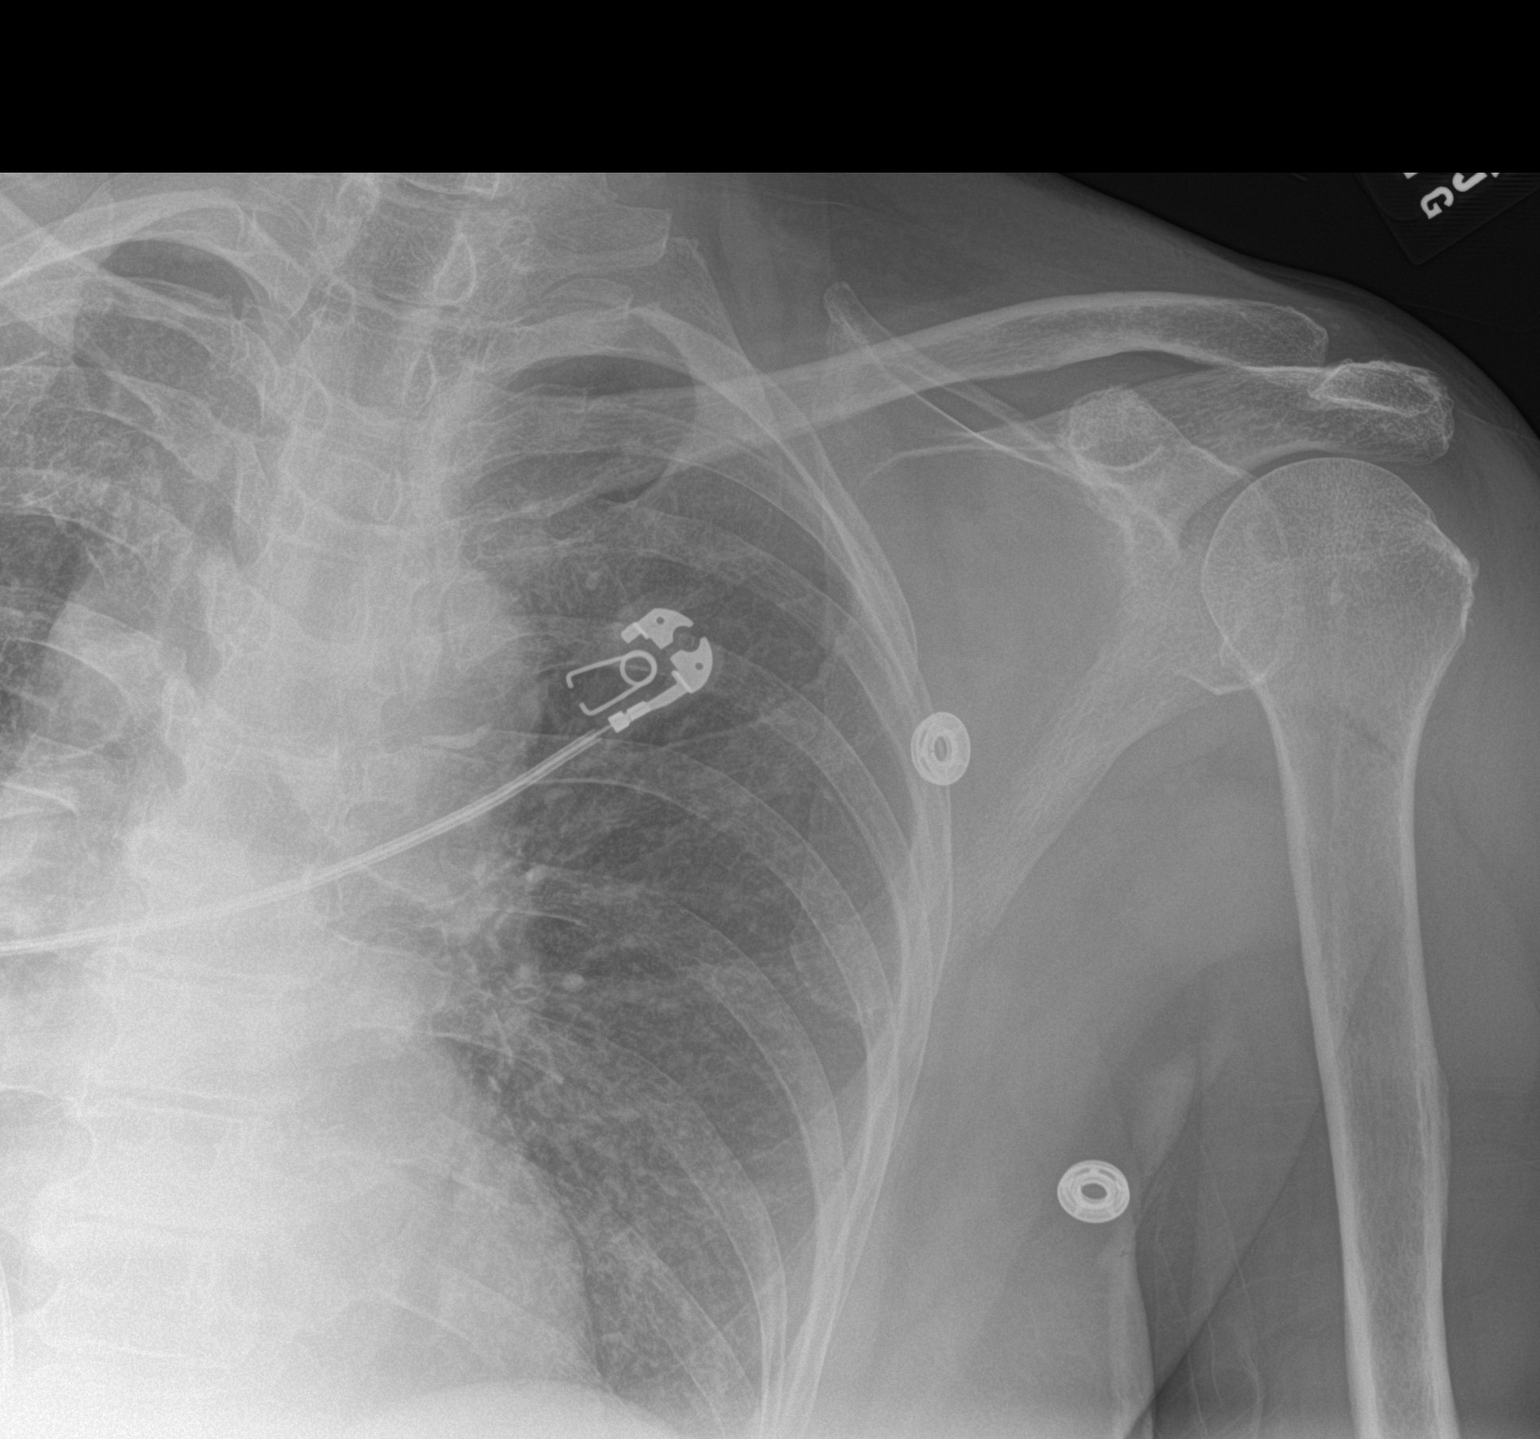
[im 2/2]
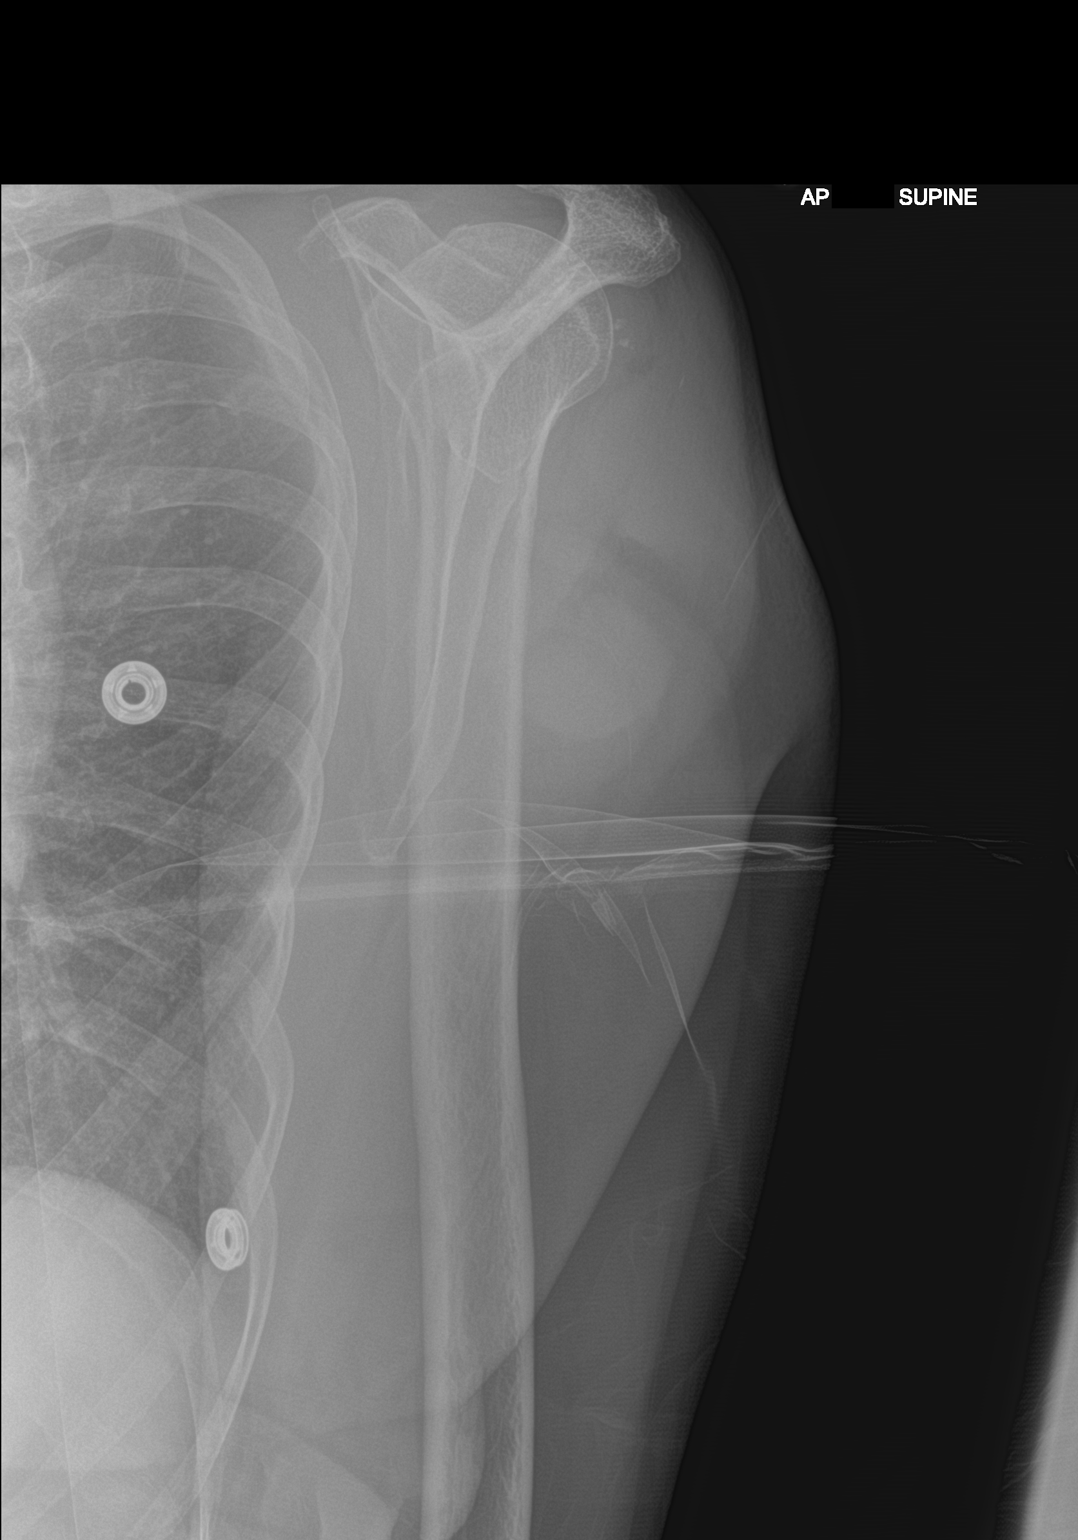

[2 of 2 positions shown; findings below may reference images not displayed]

FINDINGS: AP portable supine and scapular Y-views of the left shoulder
demonstrate reduced anterior glenohumeral dislocation. Possible
small fracture fragments along the posterior humeral head on image 2
(arrow). Otherwise the left clavicle, scapula, and proximal left
humerus appear intact. Negative visible left chest.
IMPRESSION: Reduced left shoulder dislocation. Possible small fracture fragments
along the posterior humeral head.

## 2019-11-05 IMAGING — CR LEFT HUMERUS - 2+ VIEW
2 series · 2 of 2 positions shown · non-contrast
Comparison: None.

CLINICAL DATA: Fall.  Shoulder pain.

EXAM:
LEFT HUMERUS - 2+ VIEW

[humerus ap]
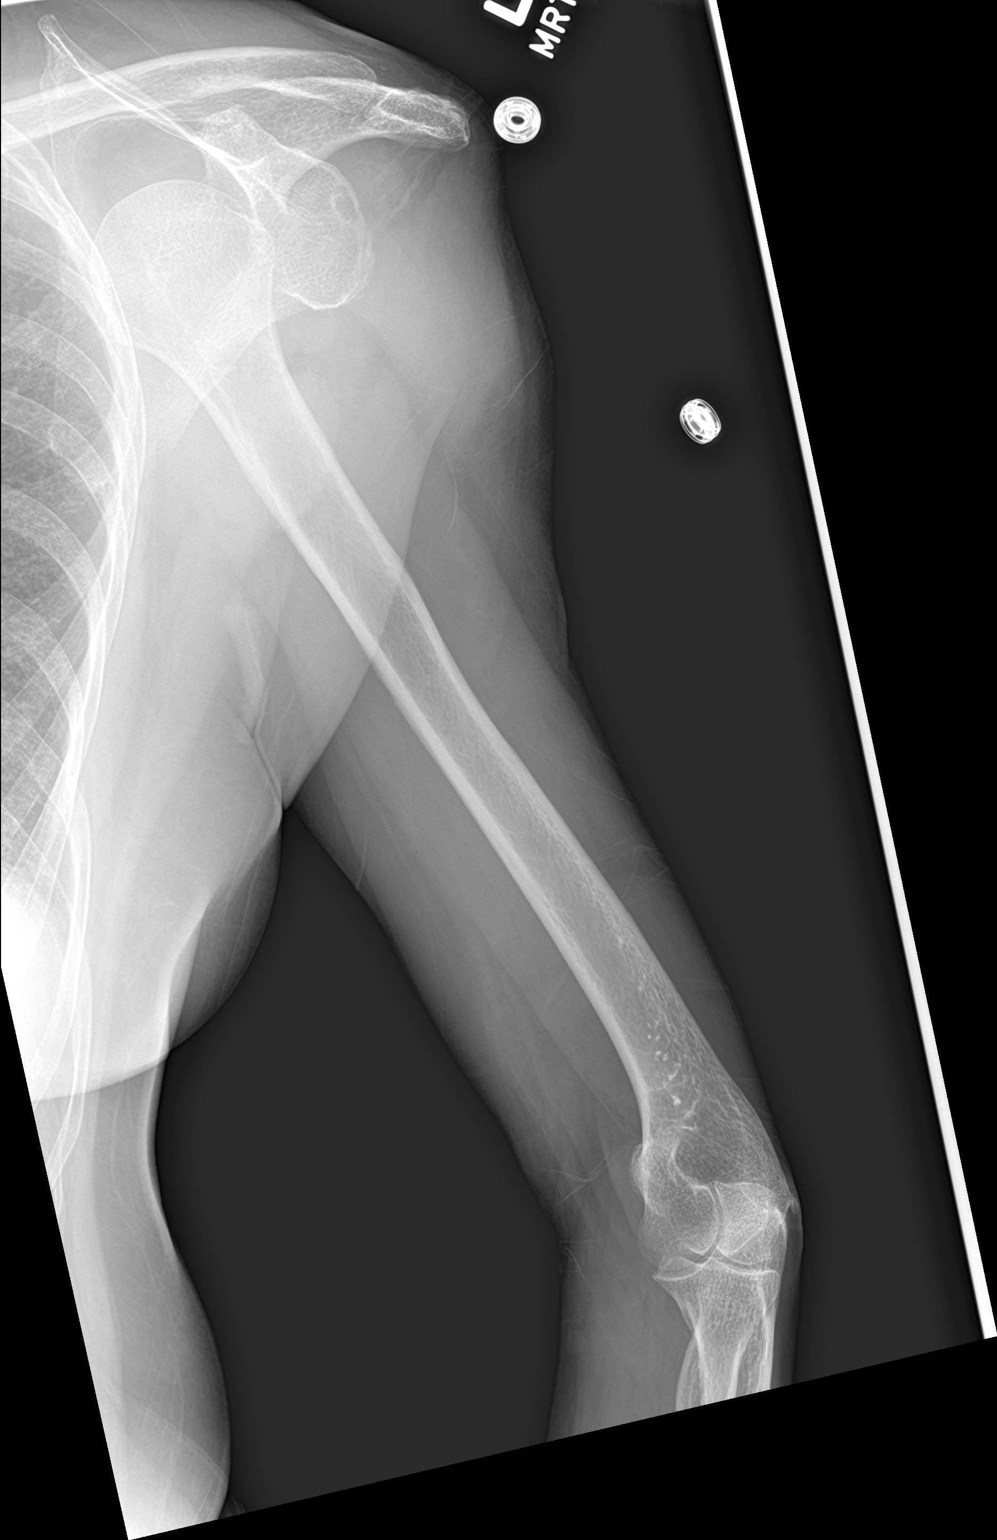

[humerus lat]
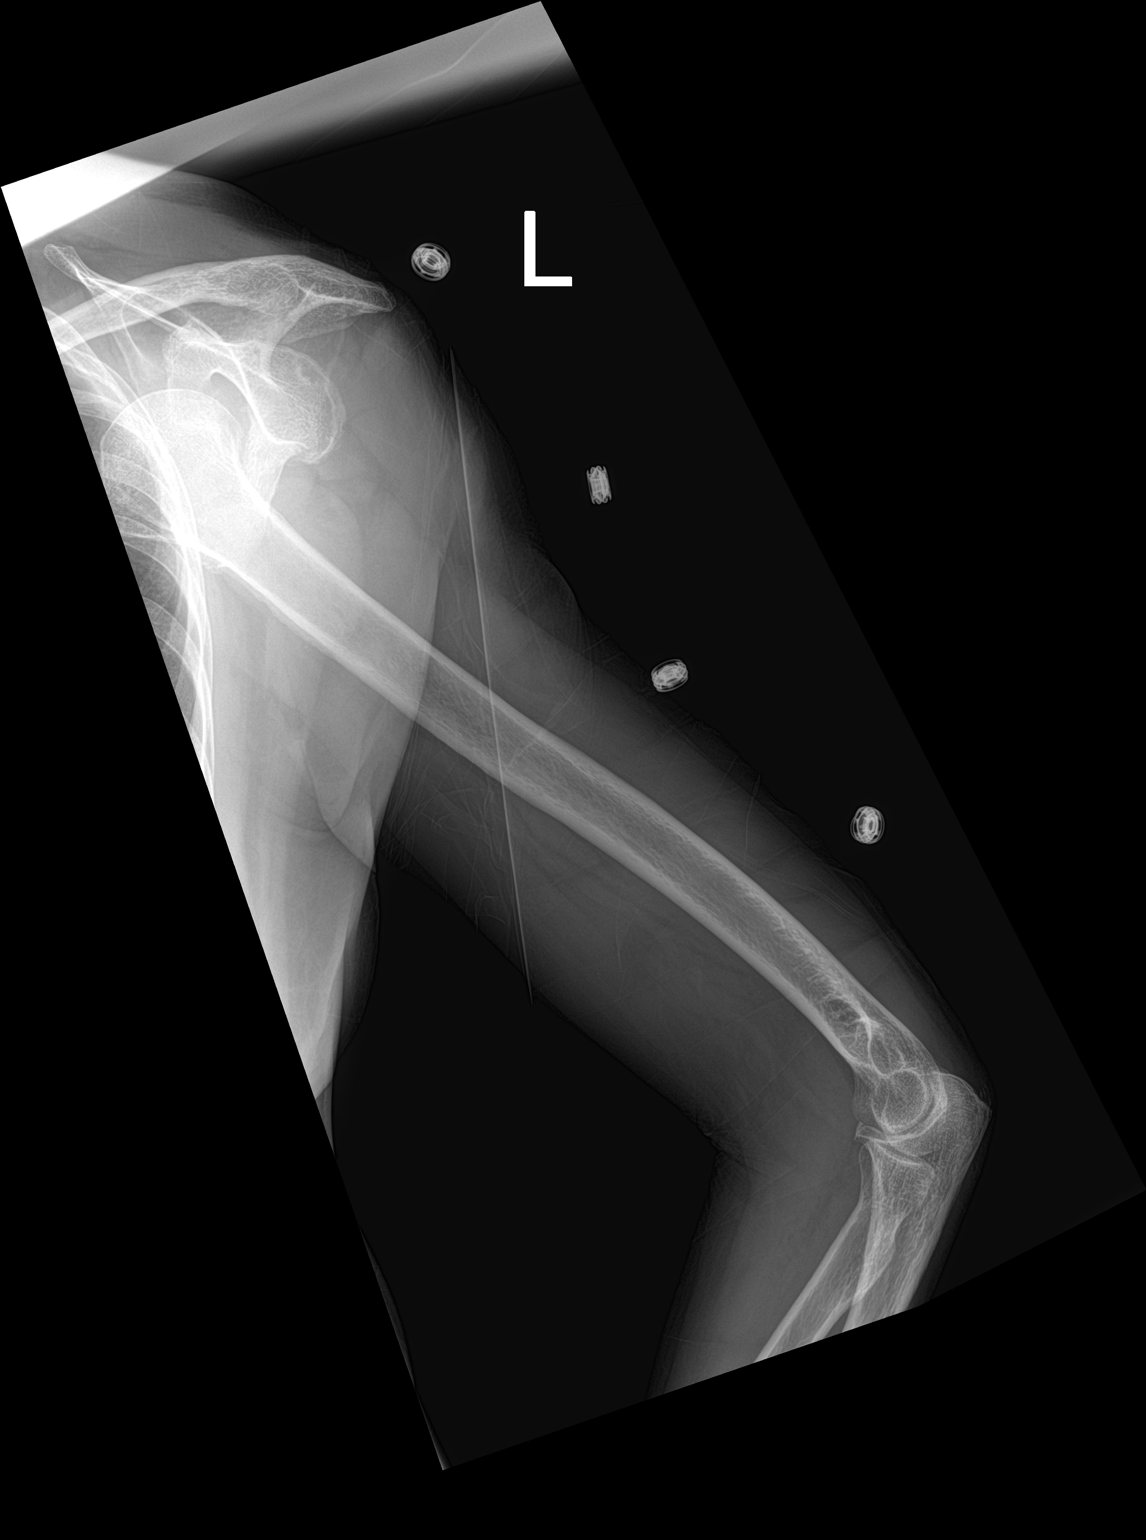

[2 of 2 positions shown; findings below may reference images not displayed]

FINDINGS: Anterior humeral head dislocation evident. Potential Hill-Sachs
deformity noted. Otherwise no fracture in the humerus.
IMPRESSION: Anterior humeral head dislocation with potential Hill-Sachs
deformity.

## 2019-12-10 ENCOUNTER — Ambulatory Visit (INDEPENDENT_AMBULATORY_CARE_PROVIDER_SITE_OTHER): Payer: Medicare HMO | Admitting: Family Medicine

## 2019-12-10 ENCOUNTER — Encounter: Payer: Self-pay | Admitting: Family Medicine

## 2019-12-10 ENCOUNTER — Other Ambulatory Visit: Payer: Self-pay

## 2019-12-10 VITALS — BP 131/79 | HR 73 | Temp 97.9°F | Ht 66.0 in | Wt 125.0 lb

## 2019-12-10 DIAGNOSIS — F05 Delirium due to known physiological condition: Secondary | ICD-10-CM | POA: Insufficient documentation

## 2019-12-10 DIAGNOSIS — R413 Other amnesia: Secondary | ICD-10-CM

## 2019-12-10 DIAGNOSIS — F03911 Unspecified dementia, unspecified severity, with agitation: Secondary | ICD-10-CM | POA: Insufficient documentation

## 2019-12-10 DIAGNOSIS — F419 Anxiety disorder, unspecified: Secondary | ICD-10-CM

## 2019-12-10 DIAGNOSIS — R21 Rash and other nonspecific skin eruption: Secondary | ICD-10-CM | POA: Insufficient documentation

## 2019-12-10 DIAGNOSIS — F0390 Unspecified dementia without behavioral disturbance: Secondary | ICD-10-CM

## 2019-12-10 DIAGNOSIS — R8290 Unspecified abnormal findings in urine: Secondary | ICD-10-CM

## 2019-12-10 DIAGNOSIS — Z09 Encounter for follow-up examination after completed treatment for conditions other than malignant neoplasm: Secondary | ICD-10-CM | POA: Diagnosis not present

## 2019-12-10 DIAGNOSIS — R69 Illness, unspecified: Secondary | ICD-10-CM | POA: Diagnosis not present

## 2019-12-10 DIAGNOSIS — F0391 Unspecified dementia with behavioral disturbance: Secondary | ICD-10-CM | POA: Diagnosis not present

## 2019-12-10 LAB — POCT URINALYSIS DIPSTICK
Blood, UA: NEGATIVE
Glucose, UA: NEGATIVE
Ketones, UA: NEGATIVE
Nitrite, UA: NEGATIVE
Protein, UA: POSITIVE — AB
Spec Grav, UA: >=1.03 — AB (ref 1.010–1.025)
Urobilinogen, UA: 0.2 E.U./dL
pH, UA: 5.5 (ref 5.0–8.0)

## 2019-12-10 MED ORDER — MEMANTINE HCL ER 28 MG PO CP24: 28.0000 mg | ORAL_CAPSULE | Freq: Every day | ORAL | 3 refills | Status: DC

## 2019-12-10 MED ORDER — BUSPIRONE HCL 5 MG PO TABS: 5.0000 mg | ORAL_TABLET | Freq: Every day | ORAL | 3 refills | Status: DC

## 2019-12-10 NOTE — Progress Notes (Signed)
[facility]   Established Patient Office Visit  Subjective:  Patient ID: Katelyn Sparks, female    DOB: [birth year]  Age: 84 y.o. MRN: [id number]  CC:  Chief Complaint  Patient presents with  . Follow-up    Dementia   . Eczema    hands very dry    HPI Katelyn Sparks is a 84 year old female who presents for Follow Up today.   Past Medical History:  Diagnosis Date  . Blood in stool   . Closed dislocation of left shoulder 05/06/2019  . Dementia (HCC)   . Esophageal reflux   . Flatulence, eructation, and gas pain   . Osteoarthrosis, unspecified whether generalized or localized, unspecified site   . Unspecified hemorrhoids without mention of complication    Current Status: Since her last office visit, she is accompanied by her son today, and we will also have her other son on speaker phone during office visit today. Katelyn Sparks has been obsessively washing her hands with soap and using hand sanitizer, which are now itchy and dried it out. She has continued to have moments of agitation r/t to her memory loss. Sons state that frustration increases in the evening hours. She denies fevers, chills, fatigue, recent infections, weight loss, and night sweats. She has not had any headaches, visual changes, dizziness, and falls. No chest pain, heart palpitations, cough and shortness of breath reported. No reports of GI problems such as nausea, vomiting, diarrhea, and constipation. She has no reports of blood in stools, dysuria and hematuria. No depression or anxiety, and denies suicidal ideations, homicidal ideations, or auditory hallucinations. She denies pain today.   Past Surgical History:  Procedure Laterality Date  . ABDOMINAL HYSTERECTOMY    . COLONOSCOPY  11/2010  . UPPER GI ENDOSCOPY  11/2010    Family History  Problem Relation Age of Onset  . Hypertension Mother   . Diabetes Mother   . Diabetes Sister   . Diabetes Brother   .  Diabetes Other   . Colon cancer Neg Hx   . Cancer Neg Hx   . Early death Neg Hx   . Heart disease Neg Hx   . Hyperlipidemia Neg Hx   . Kidney disease Neg Hx   . Learning disabilities Neg Hx   . Stroke Neg Hx     Social History   Socioeconomic History  . Marital status: Widowed    Spouse name: Not on file  . Number of children: 2  . Years of education: 68  . Highest education level: Not on file  Occupational History  . Occupation: Retired    Associate Professor: RETIRED  Tobacco Use  . Smoking status: Never Smoker  . Smokeless tobacco: Never Used  Substance and Sexual Activity  . Alcohol use: No    Alcohol/week: 0.0 standard drinks  . Drug use: No  . Sexual activity: Not Currently  Other Topics Concern  . Not on file  Social History Narrative   She is a retired from a tobacco company.   Her husband passed away in 05-May-2000.  She has two son.   Patient is right handed.   Her grandson lives with her.   Patient drinks 1 cup of caffeine daily   Social Determinants of Health   Financial Resource Strain:   . Difficulty of Paying Living Expenses: Not on file  Food Insecurity:   . Worried About Programme researcher, broadcasting/film/video in the Last Year:  Not on file  . Ran Out of Food in the Last Year: Not on file  Transportation Needs:   . Lack of Transportation (Medical): Not on file  . Lack of Transportation (Non-Medical): Not on file  Physical Activity:   . Days of Exercise per Week: Not on file  . Minutes of Exercise per Session: Not on file  Stress:   . Feeling of Stress : Not on file  Social Connections:   . Frequency of Communication with Friends and Family: Not on file  . Frequency of Social Gatherings with Friends and Family: Not on file  . Attends Religious Services: Not on file  . Active Member of Clubs or Organizations: Not on file  . Attends Archivist Meetings: Not on file  . Marital Status: Not on file  Intimate Partner Violence:   . Fear of Current or Ex-Partner: Not on file    . Emotionally Abused: Not on file  . Physically Abused: Not on file  . Sexually Abused: Not on file    Outpatient Medications Prior to Visit  Medication Sig Dispense Refill  . ciclopirox (LOPROX) 0.77 % cream Apply topically 2 (two) times daily. 30 g 2  . Omega-3 Fatty Acids (FISH OIL) 1000 MG CAPS Take by mouth.    . terbinafine (LAMISIL) 250 MG tablet Take 1 tablet (250 mg total) by mouth daily. 30 tablet 3  . traZODone (DESYREL) 50 MG tablet Take 2 tablets (total of 100 mg) at bedtime. 60 tablet 1  . busPIRone (BUSPAR) 5 MG tablet Take 1 tablet (5 mg total) by mouth daily. 30 tablet 3  . omeprazole (PRILOSEC) 40 MG capsule Take 1 capsule (40 mg total) by mouth daily. (Patient not taking: Reported on 12/10/2019) 90 capsule 3  . memantine (NAMENDA XR) 28 MG CP24 24 hr capsule Take 1 capsule (28 mg total) by mouth daily. (Patient not taking: Reported on 12/10/2019) 90 capsule 3   No facility-administered medications prior to visit.    No Known Allergies  ROS Review of Systems  Constitutional: Negative.   HENT: Negative.   Eyes: Negative.   Respiratory: Negative.   Cardiovascular: Negative.   Gastrointestinal: Negative.   Endocrine: Negative.   Genitourinary: Negative.   Musculoskeletal: Negative.   Skin: Positive for rash (billateral had rash and dryness).  Allergic/Immunologic: Negative.   Neurological: Negative.   Hematological: Negative.   Psychiatric/Behavioral: Positive for agitation (occasionally). The patient is nervous/anxious (occasional ).       Objective:    Physical Exam  Constitutional: She is oriented to person, place, and time. She appears well-developed and well-nourished.  HENT:  Head: Normocephalic and atraumatic.  Eyes: Conjunctivae are normal.  Cardiovascular: Normal rate, regular rhythm, normal heart sounds and intact distal pulses.  Pulmonary/Chest: Effort normal and breath sounds normal.  Abdominal: Bowel sounds are normal.  Musculoskeletal:         General: Normal range of motion.     Cervical back: Normal range of motion and neck supple.  Neurological: She is alert and oriented to person, place, and time. She has normal reflexes.  Skin: Skin is warm and dry. Rash (dryness) noted.  Psychiatric: She has a normal mood and affect. Her behavior is normal. Judgment and thought content normal.  Nursing note and vitals reviewed.   BP 131/79   Pulse 73   Temp 97.9 F (36.6 C)   Ht 5\' 6"  (1.676 m)   Wt 125 lb (56.7 kg)   SpO2 98%  BMI 20.18 kg/m  Wt Readings from Last 3 Encounters:  12/10/19 125 lb (56.7 kg)  10/01/19 128 lb 6.4 oz (58.2 kg)  09/24/19 155 lb (70.3 kg)     Health Maintenance Due  Topic Date Due  . OPHTHALMOLOGY EXAM  04/13/1947  . URINE MICROALBUMIN  04/13/1947  . FOOT EXAM  09/20/2018    There are no preventive care reminders to display for this patient.  Lab Results  Component Value Date   TSH 1.55 03/06/2017   Lab Results  Component Value Date   WBC 6.2 09/24/2019   HGB 12.2 09/24/2019   HCT 34.2 (L) 09/24/2019   MCV 85.7 09/24/2019   PLT 211 09/24/2019   Lab Results  Component Value Date   NA 138 09/24/2019   K 4.1 09/24/2019   CO2 23 09/24/2019   GLUCOSE 163 (H) 09/24/2019   BUN 24 (H) 09/24/2019   CREATININE 0.90 09/24/2019   BILITOT 0.7 03/06/2017   ALKPHOS 65 03/06/2017   AST 18 03/06/2017   ALT 12 03/06/2017   PROT 7.7 03/06/2017   ALBUMIN 4.3 03/06/2017   CALCIUM 8.9 09/24/2019   ANIONGAP 10 09/24/2019   GFR 79.53 03/06/2017   Lab Results  Component Value Date   CHOL 292 (H) 03/06/2017   Lab Results  Component Value Date   HDL 69.10 03/06/2017   Lab Results  Component Value Date   LDLCALC 207 (H) 03/06/2017   Lab Results  Component Value Date   TRIG 76.0 03/06/2017   Lab Results  Component Value Date   CHOLHDL 4 03/06/2017   Lab Results  Component Value Date   HGBA1C 5.8 (A) 10/01/2019      Assessment & Plan:   1. Memory changes  2. Dementia  without behavioral disturbance, unspecified dementia type (HCC)  3. Dementia arising in the senium and presenium [facility]) She will restart Namenda for aide in memory.  - memantine (NAMENDA XR) 28 MG CP24 24 hr capsule; Take 1 capsule (28 mg total) by mouth daily.  Dispense: 90 capsule; Refill: 3  4. Sundowning  5. Agitation due to dementia (HCC)  6. Anxiety - busPIRone (BUSPAR) 5 MG tablet; Take 1 tablet (5 mg total) by mouth daily.  Dispense: 30 tablet; Refill: 3  7. Rash of both hands She will discontinue using hand sanitizer and soap obsessively. Sons will purchase coco butter lotion to help soothe dryness of hands.   8. Abnormal urinalysis Results are pending. - Urine Culture  9. Follow up She will follow up in 3 months. - Urinalysis Dipstick  Meds ordered this encounter  Medications  . memantine (NAMENDA XR) 28 MG CP24 24 hr capsule    Sig: Take 1 capsule (28 mg total) by mouth daily.    Dispense:  90 capsule    Refill:  3  . busPIRone (BUSPAR) 5 MG tablet    Sig: Take 1 tablet (5 mg total) by mouth daily.    Dispense:  30 tablet    Refill:  3    Orders Placed This Encounter  Procedures  . Urine Culture  . Urinalysis Dipstick    Referral Orders  No referral(s) requested today    Raliegh Ip,  MSN, FNP-BC [facility] [address] [place], [place] [address] [phone] fax  Problem List Items Addressed This Visit      Nervous and Auditory   Dementia arising in the senium and presenium (HCC)  Relevant Medications   memantine (NAMENDA XR) 28 MG CP24 24 hr capsule   busPIRone (BUSPAR) 5 MG tablet     Other   Anxiety   Relevant Medications   busPIRone (BUSPAR) 5 MG tablet   Memory changes - Primary    Other Visit Diagnoses    Dementia without behavioral disturbance, unspecified dementia type (HCC)       Relevant Medications   memantine (NAMENDA XR) 28 MG CP24 24  hr capsule   busPIRone (BUSPAR) 5 MG tablet   Sundowning       Agitation due to dementia (HCC)       Rash of both hands       Abnormal urinalysis       Relevant Orders   Urine Culture   Follow up       Relevant Orders   Urinalysis Dipstick (Completed)      Meds ordered this encounter  Medications  . memantine (NAMENDA XR) 28 MG CP24 24 hr capsule    Sig: Take 1 capsule (28 mg total) by mouth daily.    Dispense:  90 capsule    Refill:  3  . busPIRone (BUSPAR) 5 MG tablet    Sig: Take 1 tablet (5 mg total) by mouth daily.    Dispense:  30 tablet    Refill:  3    Follow-up: Return in about 3 months (around 03/09/2020).    Kallie LocksNatalie M Sherrica Niehaus, FNP

## 2019-12-12 LAB — URINE CULTURE

## 2020-01-10 ENCOUNTER — Emergency Department (HOSPITAL_COMMUNITY): Payer: Medicare HMO

## 2020-01-10 ENCOUNTER — Other Ambulatory Visit: Payer: Self-pay

## 2020-01-10 ENCOUNTER — Emergency Department (HOSPITAL_COMMUNITY)
Admission: EM | Admit: 2020-01-10 | Discharge: 2020-01-10 | Disposition: A | Discharge status: Home / Self Care | Payer: Medicare HMO | Attending: Emergency Medicine | Admitting: Emergency Medicine

## 2020-01-10 DIAGNOSIS — R42 Dizziness and giddiness: Secondary | ICD-10-CM | POA: Diagnosis present

## 2020-01-10 DIAGNOSIS — F0280 Dementia in other diseases classified elsewhere without behavioral disturbance: Secondary | ICD-10-CM | POA: Diagnosis not present

## 2020-01-10 DIAGNOSIS — R55 Syncope and collapse: Secondary | ICD-10-CM | POA: Insufficient documentation

## 2020-01-10 DIAGNOSIS — G309 Alzheimer's disease, unspecified: Secondary | ICD-10-CM | POA: Insufficient documentation

## 2020-01-10 DIAGNOSIS — Z20822 Contact with and (suspected) exposure to covid-19: Secondary | ICD-10-CM | POA: Insufficient documentation

## 2020-01-10 DIAGNOSIS — Z79899 Other long term (current) drug therapy: Secondary | ICD-10-CM | POA: Insufficient documentation

## 2020-01-10 DIAGNOSIS — R69 Illness, unspecified: Secondary | ICD-10-CM | POA: Diagnosis not present

## 2020-01-10 DIAGNOSIS — I959 Hypotension, unspecified: Secondary | ICD-10-CM | POA: Diagnosis not present

## 2020-01-10 DIAGNOSIS — R61 Generalized hyperhidrosis: Secondary | ICD-10-CM | POA: Diagnosis not present

## 2020-01-10 DIAGNOSIS — R110 Nausea: Secondary | ICD-10-CM | POA: Diagnosis not present

## 2020-01-10 LAB — CBC WITH DIFFERENTIAL/PLATELET
Abs Immature Granulocytes: 0.02 10*3/uL (ref 0.00–0.07)
Basophils Absolute: 0 10*3/uL (ref 0.0–0.1)
Basophils Relative: 0 %
Eosinophils Absolute: 0 10*3/uL (ref 0.0–0.5)
Eosinophils Relative: 1 %
HCT: 39.5 % (ref 36.0–46.0)
Hemoglobin: 13.8 g/dL (ref 12.0–15.0)
Immature Granulocytes: 0 %
Lymphocytes Relative: 27 %
Lymphs Abs: 1.5 10*3/uL (ref 0.7–4.0)
MCH: 29.7 pg (ref 26.0–34.0)
MCHC: 34.9 g/dL (ref 30.0–36.0)
MCV: 84.9 fL (ref 80.0–100.0)
Monocytes Absolute: 0.3 10*3/uL (ref 0.1–1.0)
Monocytes Relative: 6 %
Neutro Abs: 3.5 10*3/uL (ref 1.7–7.7)
Neutrophils Relative %: 66 %
Platelets: 182 10*3/uL (ref 150–400)
RBC: 4.65 MIL/uL (ref 3.87–5.11)
RDW: 13.3 % (ref 11.5–15.5)
WBC: 5.3 10*3/uL (ref 4.0–10.5)
nRBC: 0 % (ref 0.0–0.2)

## 2020-01-10 LAB — URINALYSIS, ROUTINE W REFLEX MICROSCOPIC
Bilirubin Urine: NEGATIVE
Glucose, UA: NEGATIVE mg/dL
Hgb urine dipstick: NEGATIVE
Ketones, ur: NEGATIVE mg/dL
Nitrite: NEGATIVE
Protein, ur: NEGATIVE mg/dL
Specific Gravity, Urine: 1.014 (ref 1.005–1.030)
pH: 5 (ref 5.0–8.0)

## 2020-01-10 LAB — BASIC METABOLIC PANEL
Anion gap: 11 (ref 5–15)
BUN: 12 mg/dL (ref 8–23)
CO2: 24 mmol/L (ref 22–32)
Calcium: 9.5 mg/dL (ref 8.9–10.3)
Chloride: 106 mmol/L (ref 98–111)
Creatinine, Ser: 1.09 mg/dL — ABNORMAL HIGH (ref 0.44–1.00)
GFR calc Af Amer: 55 mL/min — ABNORMAL LOW (ref >60)
GFR calc non Af Amer: 47 mL/min — ABNORMAL LOW (ref >60)
Glucose, Bld: 133 mg/dL — ABNORMAL HIGH (ref 70–99)
Potassium: 4 mmol/L (ref 3.5–5.1)
Sodium: 141 mmol/L (ref 135–145)

## 2020-01-10 LAB — CBG MONITORING, ED: Glucose-Capillary: 94 mg/dL (ref 70–99)

## 2020-01-10 LAB — POC SARS CORONAVIRUS 2 AG -  ED: SARS Coronavirus 2 Ag: NEGATIVE

## 2020-01-10 LAB — TROPONIN I (HIGH SENSITIVITY): Troponin I (High Sensitivity): 4 ng/L (ref <18)

## 2020-01-10 MED ORDER — SODIUM CHLORIDE 0.9 % IV BOLUS
1000.0000 mL | Freq: Once | INTRAVENOUS | Status: AC
  Administered 2020-01-10: 1000 mL via INTRAVENOUS

## 2020-01-10 NOTE — ED Triage Notes (Signed)
Pt from home via ems; had syncopal episode on toilet after getting out of shower, no fall; hx dementia; a and o x 2 (to person and place), per son pt at baseline; lives with son; diaphoretic and pale on ems arrival; first bp 82/60; hr 70, 100% RA, CBG 172; 200 mL NS given PTA; pt c/o nausea, denies pain, no neuro deficits with ems  Last BP 110/68   Katelyn Sparks (Son) [phone]

## 2020-01-10 NOTE — ED Provider Notes (Signed)
[facility] EMERGENCY DEPARTMENT Provider Note   CSN: [id number] Arrival date & time: 01/10/20  1014     History CC: near Syncope  Katelyn Sparks is a 85 y.o. female with a history of Alzheimer's dementia presenting to the emergency department with lightheadedness.  Patient reportedly had a near syncopal episode on the toilet today after using the shower.  There was no LOC and no fall.  There is no head trauma reported.  She reports she simply felt lightheaded getting up from the toilet.  EMS reports that the patient was pale and diaphoretic on arrival, and had a blood pressure of 82/60 and a heart rate of 70.  She was satting 100% on room air.  Her glucose was under 72 in the field.  They gave her 200 cc of normal fluid, and upon arrival patient's BP was 125/67.  She is feeling well, denies lightheadedness, headache, abdominal pain, nausea.  Not on blood thinners  Update 130 pm - I spoke to Katelyn Sparks the patient's grandson who reports that she had near-syncope while on the toilet today.  He tells me this is happened before but never so pronounced.  He said she looked weak but was still holding herself up on the toilet.  It is unclear whether there was true loss of consciousness.  She did not fall or strike her head.  She was telling the family she felt "sick" afterwards but there was no further clarification of what this means.   HPI     Past Medical History:  Diagnosis Date  . Blood in stool   . Closed dislocation of left shoulder 03/2019  . Dementia (HCC)   . Esophageal reflux   . Flatulence, eructation, and gas pain   . Osteoarthrosis, unspecified whether generalized or localized, unspecified site   . Unspecified hemorrhoids without mention of complication     Patient Active Problem List   Diagnosis Date Noted  . Sundowning 12/10/2019  . Agitation due to dementia (HCC) 12/10/2019  . Rash of both hands 12/10/2019  . Pre-diabetes 10/02/2019  . Memory changes  10/02/2019  . Anxiety 10/02/2019  . Toenail fungus 10/02/2019  . History of recent fall 04/04/2019  . Insomnia due to medical condition 08/20/2014  . Dementia without behavioral disturbance (HCC) 08/20/2014  . Chronic constipation 11/12/2013  . Hyperglycemia 08/28/2013  . Hyperlipidemia with target LDL less than 130 08/28/2013  . Other screening mammogram 08/28/2013  . Osteopenia 08/28/2013  . Routine general medical examination at a health care facility 08/28/2013  . GERD without esophagitis 07/26/2010    Past Surgical History:  Procedure Laterality Date  . ABDOMINAL HYSTERECTOMY    . COLONOSCOPY  11/2010  . UPPER GI ENDOSCOPY  11/2010     OB History    Gravida      Para      Term      Preterm      AB      Living  2     SAB      TAB      Ectopic      Multiple      Live Births              Family History  Problem Relation Age of Onset  . Hypertension Mother   . Diabetes Mother   . Diabetes Sister   . Diabetes Brother   . Diabetes Other   . Colon cancer Neg Hx   . Cancer Neg Hx   .  Early death Neg Hx   . Heart disease Neg Hx   . Hyperlipidemia Neg Hx   . Kidney disease Neg Hx   . Learning disabilities Neg Hx   . Stroke Neg Hx     Social History   Tobacco Use  . Smoking status: Never Smoker  . Smokeless tobacco: Never Used  Substance Use Topics  . Alcohol use: No    Alcohol/week: 0.0 standard drinks  . Drug use: No    Home Medications Prior to Admission medications   Medication Sig Start Date End Date Taking? Authorizing Provider  busPIRone (BUSPAR) 5 MG tablet Take 1 tablet (5 mg total) by mouth daily. 12/10/19  Yes Kallie Locks, FNP  memantine (NAMENDA XR) 28 MG CP24 24 hr capsule Take 1 capsule (28 mg total) by mouth daily. 12/10/19  Yes Kallie Locks, FNP  Omega-3 Fatty Acids (FISH OIL) 1000 MG CAPS Take by mouth.   Yes [provider]  traZODone (DESYREL) 50 MG tablet Take 2 tablets (total of 100 mg) at bedtime.  06/04/18  Yes Kallie Locks, FNP  ciclopirox (LOPROX) 0.77 % cream Apply topically 2 (two) times daily. Patient not taking: Reported on 01/10/2020 01/09/19   Kallie Locks, FNP  omeprazole (PRILOSEC) 40 MG capsule Take 1 capsule (40 mg total) by mouth daily. Patient not taking: Reported on 12/10/2019 10/17/18   Kallie Locks, FNP  terbinafine (LAMISIL) 250 MG tablet Take 1 tablet (250 mg total) by mouth daily. Patient not taking: Reported on 01/10/2020 10/01/19   Kallie Locks, FNP    Allergies    Patient has no known allergies.  Review of Systems   Review of Systems  Unable to perform ROS: Dementia (level 5 caveat)    Physical Exam Updated Vital Signs BP 103/80   Pulse 71   Temp 98.2 F (36.8 C) (Oral)   Resp 17   Ht 5\' 5"  (1.651 m)   Wt 56.7 kg   SpO2 95%   BMI 20.80 kg/m   Physical Exam Vitals and nursing note reviewed.  Constitutional:      General: She is not in acute distress.    Appearance: She is well-developed.     Comments: Thin  HENT:     Head: Normocephalic and atraumatic.  Eyes:     Conjunctiva/sclera: Conjunctivae normal.  Cardiovascular:     Rate and Rhythm: Normal rate and regular rhythm.     Pulses: Normal pulses.  Pulmonary:     Effort: Pulmonary effort is normal. No respiratory distress.  Abdominal:     General: There is no distension.     Palpations: Abdomen is soft.     Tenderness: There is no abdominal tenderness. There is no guarding.  Musculoskeletal:     Cervical back: Neck supple.  Skin:    General: Skin is warm and dry.  Neurological:     Mental Status: She is alert.     ED Results / Procedures / Treatments   Labs (all labs ordered are listed, but only abnormal results are displayed) Labs Reviewed  BASIC METABOLIC PANEL - Abnormal; Notable for the following components:      Result Value   Glucose, Bld 133 (*)    Creatinine, Ser 1.09 (*)    GFR calc non Af Amer 47 (*)    GFR calc Af Amer 55 (*)    All other  components within normal limits  URINALYSIS, ROUTINE W REFLEX MICROSCOPIC - Abnormal; Notable for the following  components:   APPearance HAZY (*)    Leukocytes,Ua SMALL (*)    Bacteria, UA RARE (*)    All other components within normal limits  CBC WITH DIFFERENTIAL/PLATELET  POC SARS CORONAVIRUS 2 AG -  ED  CBG MONITORING, ED  TROPONIN I (HIGH SENSITIVITY)    EKG EKG Interpretation  Date/Time:  Friday January 10 2020 10:23:07 EST Ventricular Rate:  69 PR Interval:    QRS Duration: 88 QT Interval:  426 QTC Calculation: 457 R Axis:   -36 Text Interpretation: Sinus rhythm Prolonged PR interval Left axis deviation No STEMI Confirmed by Octaviano Glow [phone]) on 01/10/2020 10:27:35 AM   Radiology DG CHEST PORT 1 VIEW  Result Date: 01/10/2020 CLINICAL DATA:  Syncope EXAM: PORTABLE CHEST 1 VIEW COMPARISON:  September 24, 2019 FINDINGS: Lungs are clear. Heart size and pulmonary vascularity are normal. No adenopathy. There is midthoracic dextroscoliosis. There is aortic atherosclerosis. No bone lesions. IMPRESSION: Lungs clear. Cardiac silhouette normal. Aortic Atherosclerosis (ICD10-I70.0). Electronically Signed   By: Lowella Grip III M.D.   On: 01/10/2020 11:17    Procedures Procedures (including critical care time)  Medications Ordered in ED Medications  sodium chloride 0.9 % bolus 1,000 mL (0 mLs Intravenous Stopped 01/10/20 1239)    ED Course  I have reviewed the triage vital signs and the nursing notes.  Pertinent labs & imaging results that were available during my care of the patient were reviewed by me and considered in my medical decision making (see chart for details).  85 year old female presented to emergency department near syncopal episode that occurred this morning while getting off the toilet.  She reportedly had a low blood pressure in the field which improved within normal limits with only 200 cc of fluids upon arrival.  Her vital signs are unremarkable.  She  has no tachypnea or hypoxia suggestive of pulmonary embolism.  This was likely orthostatic hypotension related to dehydration given her reported improvement after only a small fluid bolus.   Alternatively this could have been a vasovagal episode given that she was on the toilet.  She apparently has had similar episodes in the past per her family's histry.   We'll give her more fluids, check ecg, trop, hgb, electrolyte levels.    Also check covid given report of weakness and lightheadedness this morning.  No other respiratory symptoms.  ANESHA HACKERT was evaluated in Emergency Department on 01/10/2020 for the symptoms described in the history of present illness. She was evaluated in the context of the global COVID-19 pandemic, which necessitated consideration that the patient might be at risk for infection with the SARS-CoV-2 virus that causes COVID-19. Institutional protocols and algorithms that pertain to the evaluation of patients at risk for COVID-19 are in a state of rapid change based on information released by regulatory bodies including the CDC and federal and state organizations. These policies and algorithms were followed during the patient's care in the ED.   Clinical Course as of Jan 10 1848  Fri Jan 10, 2020  1323 Tachycardia resolved after IVF, suspect this was largely related to orthostasis, awaiting UA and then anticipate discharge home   [MT]  1423 Negative nitrites, asymptomatic with only small leuks, no WBC's, unlikely bacterial UTI, will discharge.  Spoke to son Katelyn Sparks at bedside, we will have her f/u with Dr Virgina Jock as an outpatient for cardiac evaluation this week.  Dr Virgina Jock notified and he will have his office help set this up   [MT]  Clinical Course User Index [MT] Benicia Bergevin, Kermit Balo, MD    Final Clinical Impression(s) / ED Diagnoses Final diagnoses:  Near syncope    Rx / DC Orders ED Discharge Orders    None       Terald Sleeper, MD 01/10/20  1850

## 2020-01-10 NOTE — ED Notes (Signed)
Updated son who is at bedside. 

## 2020-01-10 NOTE — Discharge Instructions (Addendum)
You should be contacted by our cardiology office for Dr. Rosemary Holms to schedule a follow up appointment this week.  If you do not hear from them in 48 hours, call the number circled above.    Your work-up today showed signs of mild dehydration.  We gave you some IV fluids.  I did not see any medical emergencies on your work-up in the emergency department.  However, I do recommend that you see a cardiologist as an outpatient in the next 1-2 weeks.  You may need more imaging of your heart that we can't do in the Emergency Department.

## 2020-01-22 ENCOUNTER — Ambulatory Visit: Payer: Medicare HMO | Admitting: Podiatry

## 2020-02-09 ENCOUNTER — Ambulatory Visit: Payer: Medicare HMO | Attending: Internal Medicine

## 2020-02-09 DIAGNOSIS — Z23 Encounter for immunization: Secondary | ICD-10-CM

## 2020-02-09 NOTE — Progress Notes (Signed)
   Covid-19 Vaccination Clinic  Name:  ELOUISE DIVELBISS    MRN: [id number] DOB: May 15, 1935  02/09/2020  Ms. Roberti was observed post Covid-19 immunization for 15 minutes without incidence. She was provided with Vaccine Information Sheet and instruction to access the V-Safe system.   Ms. Fischman was instructed to call 911 with any severe reactions post vaccine: Marland Kitchen Difficulty breathing  . Swelling of your face and throat  . A fast heartbeat  . A bad rash all over your body  . Dizziness and weakness    Immunizations Administered    Name Date Dose VIS Date Route   Pfizer COVID-19 Vaccine 02/09/2020  3:34 PM 0.3 mL 11/22/2019 Intramuscular   Manufacturer: [employer], [employer]   Lot: [id number]   NDC: [address]-4

## 2020-02-10 DIAGNOSIS — Z0100 Encounter for examination of eyes and vision without abnormal findings: Secondary | ICD-10-CM | POA: Diagnosis not present

## 2020-02-10 DIAGNOSIS — H524 Presbyopia: Secondary | ICD-10-CM | POA: Diagnosis not present

## 2020-03-03 ENCOUNTER — Ambulatory Visit: Payer: Medicare HMO | Attending: Internal Medicine

## 2020-03-03 DIAGNOSIS — Z23 Encounter for immunization: Secondary | ICD-10-CM

## 2020-03-03 NOTE — Progress Notes (Signed)
   Covid-19 Vaccination Clinic  Name:  Katelyn Sparks    MRN: [id number] DOB: [birth year]  03/03/2020  [name] was observed post Covid-19 immunization for 15 minutes without incident. She was provided with Vaccine Information Sheet and instruction to access the V-Safe system.   [name] was instructed to call 911 with any severe reactions post vaccine: Marland Kitchen Difficulty breathing  . Swelling of face and throat  . A fast heartbeat  . A bad rash all over body  . Dizziness and weakness   Immunizations Administered    Name Date Dose VIS Date Route   Pfizer COVID-19 Vaccine 03/03/2020  1:09 PM 0.3 mL 11/22/2019 Intramuscular   Manufacturer: [employer], [employer]   Lot: [id number]   NDC: [address]-8

## 2020-03-09 ENCOUNTER — Ambulatory Visit: Payer: Medicare HMO | Admitting: Family Medicine

## 2020-03-20 ENCOUNTER — Ambulatory Visit: Payer: Medicare HMO | Admitting: Podiatry

## 2020-03-31 ENCOUNTER — Ambulatory Visit: Payer: Medicare HMO | Admitting: Family Medicine

## 2020-04-08 ENCOUNTER — Ambulatory Visit: Payer: Medicare HMO | Admitting: Family Medicine

## 2020-05-31 ENCOUNTER — Ambulatory Visit (HOSPITAL_COMMUNITY)
Admission: EM | Admit: 2020-05-31 | Discharge: 2020-05-31 | Disposition: A | Discharge status: Home / Self Care | Payer: Medicare HMO | Attending: Family Medicine | Admitting: Family Medicine

## 2020-05-31 ENCOUNTER — Encounter (HOSPITAL_COMMUNITY): Payer: Self-pay

## 2020-05-31 ENCOUNTER — Other Ambulatory Visit: Payer: Self-pay

## 2020-05-31 ENCOUNTER — Ambulatory Visit (INDEPENDENT_AMBULATORY_CARE_PROVIDER_SITE_OTHER): Payer: Medicare HMO

## 2020-05-31 DIAGNOSIS — S79911A Unspecified injury of right hip, initial encounter: Secondary | ICD-10-CM | POA: Diagnosis not present

## 2020-05-31 DIAGNOSIS — M25551 Pain in right hip: Secondary | ICD-10-CM

## 2020-05-31 DIAGNOSIS — W19XXXA Unspecified fall, initial encounter: Secondary | ICD-10-CM

## 2020-05-31 NOTE — ED Triage Notes (Signed)
Pt present right hip pain from a fall about a week ago. Pt states it hurts when she walks and apply weight on her right hip.

## 2020-05-31 NOTE — Discharge Instructions (Addendum)
The hip was normal on x-ray. Is likely bruised Rest, ice Tylenol for pain as needed Follow up as needed for continued or worsening symptoms

## 2020-05-31 NOTE — ED Provider Notes (Signed)
[facility]    CSN: [id number] Arrival date & time: 05/31/20  1445      History   Chief Complaint Chief Complaint  Patient presents with   Hip Pain    right hip    HPI SAWSAN RIGGIO is a 85 y.o. female.   Pt is a 85 year old female with PMH of dementia, reflux, osteoarthritis.  She presents today with right hip pain from a fall that occurred approximate 1 week ago.  She fell onto the right hip.  Per son she has been walking very slowly since and limping slightly.  Not complaining of much pain.  Does hurt when she applies weight.  Has not had anything home to treat the symptoms.  ROS per HPI      Past Medical History:  Diagnosis Date   Blood in stool    Closed dislocation of left shoulder 03/2019   Dementia (HCC)    Esophageal reflux    Flatulence, eructation, and gas pain    Osteoarthrosis, unspecified whether generalized or localized, unspecified site    Unspecified hemorrhoids without mention of complication     Patient Active Problem List   Diagnosis Date Noted   Sundowning 12/10/2019   Agitation due to dementia (HCC) 12/10/2019   Rash of both hands 12/10/2019   Pre-diabetes 10/02/2019   Memory changes 10/02/2019   Anxiety 10/02/2019   Toenail fungus 10/02/2019   History of recent fall 04/04/2019   Insomnia due to medical condition 08/20/2014   Dementia without behavioral disturbance (HCC) 08/20/2014   Chronic constipation 11/12/2013   Hyperglycemia 08/28/2013   Hyperlipidemia with target LDL less than 130 08/28/2013   Other screening mammogram 08/28/2013   Osteopenia 08/28/2013   Routine general medical examination at a health care facility 08/28/2013   GERD without esophagitis 07/26/2010    Past Surgical History:  Procedure Laterality Date   ABDOMINAL HYSTERECTOMY     COLONOSCOPY  11/2010   UPPER GI ENDOSCOPY  11/2010    OB History    Gravida      Para      Term      Preterm      AB      Living    2     SAB      TAB      Ectopic      Multiple      Live Births               Home Medications    Prior to Admission medications   Medication Sig Start Date End Date Taking? Authorizing Provider  busPIRone (BUSPAR) 5 MG tablet Take 1 tablet (5 mg total) by mouth daily. 12/10/19   Kallie Locks, FNP  memantine (NAMENDA XR) 28 MG CP24 24 hr capsule Take 1 capsule (28 mg total) by mouth daily. 12/10/19   Kallie Locks, FNP  Omega-3 Fatty Acids (FISH OIL) 1000 MG CAPS Take by mouth.    [provider]  traZODone (DESYREL) 50 MG tablet Take 2 tablets (total of 100 mg) at bedtime. 06/04/18   Kallie Locks, FNP  omeprazole (PRILOSEC) 40 MG capsule Take 1 capsule (40 mg total) by mouth daily. Patient not taking: Reported on 12/10/2019 10/17/18 05/31/20  Kallie Locks, FNP    Family History Family History  Problem Relation Age of Onset   Hypertension Mother    Diabetes Mother    Diabetes Sister    Diabetes Brother    Diabetes  Other    Colon cancer Neg Hx    Cancer Neg Hx    Early death Neg Hx    Heart disease Neg Hx    Hyperlipidemia Neg Hx    Kidney disease Neg Hx    Learning disabilities Neg Hx    Stroke Neg Hx     Social History Social History   Tobacco Use   Smoking status: Never Smoker   Smokeless tobacco: Never Used  Building services engineer Use: Never used  Substance Use Topics   Alcohol use: No    Alcohol/week: 0.0 standard drinks   Drug use: No     Allergies   Patient has no known allergies.   Review of Systems Review of Systems   Physical Exam Triage Vital Signs ED Triage Vitals  Enc Vitals Group     BP 05/31/20 1507 117/68     Pulse Rate 05/31/20 1507 87     Resp 05/31/20 1507 16     Temp 05/31/20 1507 97.7 F (36.5 C)     Temp Source 05/31/20 1507 Oral     SpO2 05/31/20 1507 99 %     Weight --      Height --      Head Circumference --      Peak Flow --      Pain Score 05/31/20 1508 6     Pain  Loc --      Pain Edu? --      Excl. in GC? --    No data found.  Updated Vital Signs BP 117/68 (BP Location: Left Arm)    Pulse 87    Temp 97.7 F (36.5 C) (Oral)    Resp 16    SpO2 99%   Visual Acuity Right Eye Distance:   Left Eye Distance:   Bilateral Distance:    Right Eye Near:   Left Eye Near:    Bilateral Near:     Physical Exam Vitals and nursing note reviewed.  Constitutional:      General: She is not in acute distress.    Appearance: Normal appearance. She is not ill-appearing, toxic-appearing or diaphoretic.  HENT:     Head: Normocephalic.     Nose: Nose normal.  Eyes:     Conjunctiva/sclera: Conjunctivae normal.  Pulmonary:     Effort: Pulmonary effort is normal.  Musculoskeletal:        General: Normal range of motion.     Cervical back: Normal range of motion.     Comments: Generalized tenderness to right hip.  No specific bruising, swelling or deformity. Able to ambulate in room with slight limp. Full range of motion on exam without crepitus.   Skin:    General: Skin is warm and dry.     Findings: No rash.  Neurological:     Mental Status: She is alert. Mental status is at baseline.  Psychiatric:        Mood and Affect: Mood normal.      UC Treatments / Results  Labs (all labs ordered are listed, but only abnormal results are displayed) Labs Reviewed - No data to display  EKG   Radiology DG Hip Unilat With Pelvis 2-3 Views Right  Result Date: 05/31/2020 CLINICAL DATA:  85 year old female with fall and right hip pain. EXAM: DG HIP (WITH OR WITHOUT PELVIS) 2-3V RIGHT COMPARISON:  None. FINDINGS: There is no acute fracture or dislocation. The bones are osteopenic. There is mild arthritic changes of the right  hip. The soft tissues are unremarkable. IMPRESSION: No acute fracture or dislocation. Electronically Signed   By: Anner Crete M.D.   On: 05/31/2020 15:45    Procedures Procedures (including critical care time)  Medications Ordered  in UC Medications - No data to display  Initial Impression / Assessment and Plan / UC Course  I have reviewed the triage vital signs and the nursing notes.  Pertinent labs & imaging results that were available during my care of the patient were reviewed by me and considered in my medical decision making (see chart for details).     Fall with right hip pain X-ray negative for fracture. Arthritis and osteopenia. Most likely bruise of the hip. We will have her rest, Tylenol for pain as needed and follow-up with primary as needed Final Clinical Impressions(s) / UC Diagnoses   Final diagnoses:  Fall  Right hip pain  Fall, initial encounter     Discharge Instructions     The hip was normal on x-ray. Is likely bruised Rest, ice Tylenol for pain as needed Follow up as needed for continued or worsening symptoms     ED Prescriptions    None     PDMP not reviewed this encounter.   Orvan July, NP 05/31/20 [phone]

## 2020-06-01 ENCOUNTER — Encounter: Payer: Self-pay | Admitting: Family Medicine

## 2020-06-01 ENCOUNTER — Other Ambulatory Visit: Payer: Self-pay

## 2020-06-01 ENCOUNTER — Ambulatory Visit (INDEPENDENT_AMBULATORY_CARE_PROVIDER_SITE_OTHER): Payer: Medicare HMO | Admitting: Family Medicine

## 2020-06-01 VITALS — BP 122/56 | HR 63 | Temp 97.3°F | Ht 66.0 in | Wt 128.1 lb

## 2020-06-01 DIAGNOSIS — Z9181 History of falling: Secondary | ICD-10-CM

## 2020-06-01 DIAGNOSIS — R413 Other amnesia: Secondary | ICD-10-CM

## 2020-06-01 DIAGNOSIS — R69 Illness, unspecified: Secondary | ICD-10-CM | POA: Diagnosis not present

## 2020-06-01 DIAGNOSIS — Z09 Encounter for follow-up examination after completed treatment for conditions other than malignant neoplasm: Secondary | ICD-10-CM

## 2020-06-01 DIAGNOSIS — M25551 Pain in right hip: Secondary | ICD-10-CM | POA: Diagnosis not present

## 2020-06-01 DIAGNOSIS — F419 Anxiety disorder, unspecified: Secondary | ICD-10-CM

## 2020-06-01 DIAGNOSIS — F0391 Unspecified dementia with behavioral disturbance: Secondary | ICD-10-CM

## 2020-06-01 DIAGNOSIS — F03911 Unspecified dementia, unspecified severity, with agitation: Secondary | ICD-10-CM

## 2020-06-01 DIAGNOSIS — F05 Delirium due to known physiological condition: Secondary | ICD-10-CM

## 2020-06-01 MED ORDER — NAPROXEN 500 MG PO TABS: 500.0000 mg | ORAL_TABLET | Freq: Two times a day (BID) | ORAL | 3 refills | Status: DC

## 2020-06-01 NOTE — Progress Notes (Signed)
[facility] Follow Up  Subjective:  Patient ID: Katelyn Sparks, female    DOB: [birth year]  Age: 85 y.o. MRN: [id number]  CC:  Chief Complaint  Patient presents with  . Fall    fell about 1 week ago, Urgent care said there were no fractures just brusing. Requesting some physical therapy.     HPI Katelyn Sparks is a 85 year old female who presents for Hospital Follow today.   Patient Active Problem List   Diagnosis Date Noted  . Sundowning 12/10/2019  . Agitation due to dementia ([place]) 12/10/2019  . Rash of both hands 12/10/2019  . Pre-diabetes 10/02/2019  . Memory changes 10/02/2019  . Anxiety 10/02/2019  . Toenail fungus 10/02/2019  . History of recent fall 04/04/2019  . Insomnia due to medical condition 08/20/2014  . Dementia without behavioral disturbance ([place]) 08/20/2014  . Chronic constipation 11/12/2013  . Hyperglycemia 08/28/2013  . Hyperlipidemia with target LDL less than 130 08/28/2013  . Other screening mammogram 08/28/2013  . Osteopenia 08/28/2013  . Routine general medical examination at a health care facility 08/28/2013  . GERD without esophagitis 07/26/2010     Past Medical History:  Diagnosis Date  . Blood in stool   . Closed dislocation of left shoulder 03/2019  . Dementia ([place])   . Esophageal reflux   . Flatulence, eructation, and gas pain   . Osteoarthrosis, unspecified whether generalized or localized, unspecified site   . Unspecified hemorrhoids without mention of complication    Current Status: Since her last office visit, she has had a recent fall and has c/o pain in her right hip. She reported to the Urgent Care Clinic yesterday, 05/31/2020 for X-rays, which resulted negative. She continues to have moderate memory loss and agitation, which is increased in the evening hours. She is accompanied by her son today. She denies fevers, chills, fatigue, recent infections, weight loss, and night  sweats. She has not had any headaches, visual changes, dizziness, and falls. No chest pain, heart palpitations, cough and shortness of breath reported. Denies GI problems such as nausea, vomiting, diarrhea, and constipation. She has no reports of blood in stools, dysuria and hematuria. No depression or anxiety, and denies suicidal ideations, homicidal ideations, or auditory hallucinations. She is taking all medications as prescribed.   Past Surgical History:  Procedure Laterality Date  . ABDOMINAL HYSTERECTOMY    . COLONOSCOPY  11/2010  . UPPER GI ENDOSCOPY  11/2010    Family History  Problem Relation Age of Onset  . Hypertension Mother   . Diabetes Mother   . Diabetes Sister   . Diabetes Brother   . Diabetes Other   . Colon cancer Neg Hx   . Cancer Neg Hx   . Early death Neg Hx   . Heart disease Neg Hx   . Hyperlipidemia Neg Hx   . Kidney disease Neg Hx   . Learning disabilities Neg Hx   . Stroke Neg Hx     Social History   Socioeconomic History  . Marital status: Widowed    Spouse name: Not on file  . Number of children: 2  . Years of education: 77  . Highest education level: Not on file  Occupational History  . Occupation: Retired    Fish farm manager: RETIRED  Tobacco Use  . Smoking status: Never Smoker  . Smokeless tobacco: Never Used  Vaping Use  . Vaping Use: Never used  Substance and  Sexual Activity  . Alcohol use: No    Alcohol/week: 0.0 standard drinks  . Drug use: No  . Sexual activity: Not Currently  Other Topics Concern  . Not on file  Social History Narrative   She is a retired from a tobacco company.   Her husband passed away in 2000/04/19.  She has two son.   Patient is right handed.   Her grandson lives with her.   Patient drinks 1 cup of caffeine daily   Social Determinants of Health   Financial Resource Strain:   . Difficulty of Paying Living Expenses:   Food Insecurity:   . Worried About Programme researcher, broadcasting/film/video in the Last Year:   . Barista in  the Last Year:   Transportation Needs:   . Freight forwarder (Medical):   Marland Kitchen Lack of Transportation (Non-Medical):   Physical Activity:   . Days of Exercise per Week:   . Minutes of Exercise per Session:   Stress:   . Feeling of Stress :   Social Connections:   . Frequency of Communication with Friends and Family:   . Frequency of Social Gatherings with Friends and Family:   . Attends Religious Services:   . Active Member of Clubs or Organizations:   . Attends Banker Meetings:   Marland Kitchen Marital Status:   Intimate Partner Violence:   . Fear of Current or Ex-Partner:   . Emotionally Abused:   Marland Kitchen Physically Abused:   . Sexually Abused:     Outpatient Medications Prior to Visit  Medication Sig Dispense Refill  . busPIRone (BUSPAR) 5 MG tablet Take 1 tablet (5 mg total) by mouth daily. 30 tablet 3  . memantine (NAMENDA XR) 28 MG CP24 24 hr capsule Take 1 capsule (28 mg total) by mouth daily. 90 capsule 3  . Omega-3 Fatty Acids (FISH OIL) 1000 MG CAPS Take by mouth.    . traZODone (DESYREL) 50 MG tablet Take 2 tablets (total of 100 mg) at bedtime. 60 tablet 1   No facility-administered medications prior to visit.    No Known Allergies  ROS Review of Systems  Constitutional: Negative.   HENT: Negative.   Eyes: Negative.   Respiratory: Negative.   Cardiovascular: Negative.   Gastrointestinal: Negative.   Endocrine: Negative.   Genitourinary: Negative.   Musculoskeletal: Positive for arthralgias (right hip pain).  Skin: Negative.   Allergic/Immunologic: Negative.   Neurological: Positive for dizziness (occasional ), weakness (occasional ) and headaches (occasional ).  Hematological: Negative.   Psychiatric/Behavioral: Positive for agitation (evening hours r/t dementia) and sleep disturbance (insomnia).      Objective:    Physical Exam Vitals and nursing note reviewed. Exam conducted with a chaperone present (son).  Constitutional:      Appearance: Normal  appearance.  HENT:     Head: Normocephalic and atraumatic.     Nose: Nose normal.     Mouth/Throat:     Mouth: Mucous membranes are moist.  Cardiovascular:     Rate and Rhythm: Normal rate and regular rhythm.     Pulses: Normal pulses.     Heart sounds: Normal heart sounds.  Pulmonary:     Effort: Pulmonary effort is normal.     Breath sounds: Normal breath sounds.  Abdominal:     General: Bowel sounds are normal.     Palpations: Abdomen is soft.  Musculoskeletal:     Cervical back: Normal range of motion and neck supple.  Comments: Limited ROM in right him  Skin:    General: Skin is warm and dry.  Neurological:     General: No focal deficit present.     Mental Status: She is alert and oriented to person, place, and time.  Psychiatric:     Comments: Memory deficits.      BP (!) 122/56 (BP Location: Right Arm, Patient Position: Sitting, Cuff Size: Small)   Pulse 63   Temp (!) 97.3 F (36.3 C)   Ht 5\' 6"  (1.676 m)   Wt 128 lb 0.8 oz (58.1 kg)   SpO2 100%   BMI 20.67 kg/m  Wt Readings from Last 3 Encounters:  06/01/20 128 lb 0.8 oz (58.1 kg)  01/10/20 125 lb (56.7 kg)  12/10/19 125 lb (56.7 kg)     Health Maintenance Due  Topic Date Due  . OPHTHALMOLOGY EXAM  Never done  . URINE MICROALBUMIN  Never done  . FOOT EXAM  09/20/2018    There are no preventive care reminders to display for this patient.  Lab Results  Component Value Date   TSH 1.55 03/06/2017   Lab Results  Component Value Date   WBC 5.3 01/10/2020   HGB 13.8 01/10/2020   HCT 39.5 01/10/2020   MCV 84.9 01/10/2020   PLT 182 01/10/2020   Lab Results  Component Value Date   NA 141 01/10/2020   K 4.0 01/10/2020   CO2 24 01/10/2020   GLUCOSE 133 (H) 01/10/2020   BUN 12 01/10/2020   CREATININE 1.09 (H) 01/10/2020   BILITOT 0.7 03/06/2017   ALKPHOS 65 03/06/2017   AST 18 03/06/2017   ALT 12 03/06/2017   PROT 7.7 03/06/2017   ALBUMIN 4.3 03/06/2017   CALCIUM 9.5 01/10/2020   ANIONGAP  11 01/10/2020   GFR 79.53 03/06/2017   Lab Results  Component Value Date   CHOL 292 (H) 03/06/2017   Lab Results  Component Value Date   HDL 69.10 03/06/2017   Lab Results  Component Value Date   LDLCALC 207 (H) 03/06/2017   Lab Results  Component Value Date   TRIG 76.0 03/06/2017   Lab Results  Component Value Date   CHOLHDL 4 03/06/2017   Lab Results  Component Value Date   HGBA1C 5.8 (A) 10/01/2019      Assessment & Plan:   1. Hospital discharge follow-up  2. History of recent fall Mild discomfort of right hip today. We will initiate Naproxen today.  - naproxen (NAPROSYN) 500 MG tablet; Take 1 tablet (500 mg total) by mouth 2 (two) times daily with a meal.  Dispense: 30 tablet; Refill: 3  3. Acute hip pain, right - naproxen (NAPROSYN) 500 MG tablet; Take 1 tablet (500 mg total) by mouth 2 (two) times daily with a meal.  Dispense: 30 tablet; Refill: 3  4. Memory changes She will began to take Namenda daily prescribed.   5. Dementia with behavioral disturbance, unspecified dementia type (HCC) Agitation noted in evenings.   6. Sundowning  7. Agitation due to dementia (HCC)  8. Anxiety She will begin taking Buspar as prescribed.   9. Follow up She will follow up in 6 months.  We will assess her pharmacy for possible pillpacks for better compliance.   Meds ordered this encounter  Medications  . naproxen (NAPROSYN) 500 MG tablet    Sig: Take 1 tablet (500 mg total) by mouth 2 (two) times daily with a meal.    Dispense:  30 tablet  Refill:  3    No orders of the defined types were placed in this encounter.   Referral Orders  No referral(s) requested today    Raliegh Ip,  MSN, FNP-BC [facility] [address] [place], [place] [address] [phone]- fax  Problem List Items Addressed This Visit      Nervous and Auditory   Dementia  without behavioral disturbance (HCC)     Other   Agitation due to dementia [facility])   Anxiety   History of recent fall   Relevant Medications   naproxen (NAPROSYN) 500 MG tablet   Memory changes   Sundowning    Other Visit Diagnoses    Hospital discharge follow-up    -  Primary   Acute hip pain, right       Relevant Medications   naproxen (NAPROSYN) 500 MG tablet   Follow up          Meds ordered this encounter  Medications  . naproxen (NAPROSYN) 500 MG tablet    Sig: Take 1 tablet (500 mg total) by mouth 2 (two) times daily with a meal.    Dispense:  30 tablet    Refill:  3    Follow-up: Return in about 6 months (around 12/01/2020).    Kallie Locks, FNP

## 2020-06-01 NOTE — Patient Instructions (Signed)
Fall Prevention in the Home, Adult Falls can cause injuries and can affect people from all age groups. There are many simple things that you can do to make your home safe and to help prevent falls. Ask for help when making these changes, if needed. What actions can I take to prevent falls? General instructions  Use good lighting in all rooms. Replace any light bulbs that burn out.  Turn on lights if it is dark. Use night-lights.  Place frequently used items in easy-to-reach places. Lower the shelves around your home if necessary.  Set up furniture so that there are clear paths around it. Avoid moving your furniture around.  Remove throw rugs and other tripping hazards from the floor.  Avoid walking on wet floors.  Fix any uneven floor surfaces.  Add color or contrast paint or tape to grab bars and handrails in your home. Place contrasting color strips on the first and last steps of stairways.  When you use a stepladder, make sure that it is completely opened and that the sides are firmly locked. Have someone hold the ladder while you are using it. Do not climb a closed stepladder.  Be aware of any and all pets. What can I do in the bathroom?      Keep the floor dry. Immediately clean up any water that spills onto the floor.  Remove soap buildup in the tub or shower on a regular basis.  Use non-skid mats or decals on the floor of the tub or shower.  Attach bath mats securely with double-sided, non-slip rug tape.  If you need to sit down while you are in the shower, use a plastic, non-slip stool.  Install grab bars by the toilet and in the tub and shower. Do not use towel bars as grab bars. What can I do in the bedroom?  Make sure that a bedside light is easy to reach.  Do not use oversized bedding that drapes onto the floor.  Have a firm chair that has side arms to use for getting dressed. What can I do in the kitchen?  Clean up any spills right away.  If you need to  reach for something above you, use a sturdy step stool that has a grab bar.  Keep electrical cables out of the way.  Do not use floor polish or wax that makes floors slippery. If you must use wax, make sure that it is non-skid floor wax. What can I do in the stairways?  Do not leave any items on the stairs.  Make sure that you have a light switch at the top of the stairs and the bottom of the stairs. Have them installed if you do not have them.  Make sure that there are handrails on both sides of the stairs. Fix handrails that are broken or loose. Make sure that handrails are as long as the stairways.  Install non-slip stair treads on all stairs in your home.  Avoid having throw rugs at the top or bottom of stairways, or secure the rugs with carpet tape to prevent them from moving.  Choose a carpet design that does not hide the edge of steps on the stairway.  Check any carpeting to make sure that it is firmly attached to the stairs. Fix any carpet that is loose or worn. What can I do on the outside of my home?  Use bright outdoor lighting.  Regularly repair the edges of walkways and driveways and fix any cracks.    Remove high doorway thresholds.  Trim any shrubbery on the main path into your home.  Regularly check that handrails are securely fastened and in good repair. Both sides of any steps should have handrails.  Install guardrails along the edges of any raised decks or porches.  Clear walkways of debris and clutter, including tools and rocks.  Have leaves, snow, and ice cleared regularly.  Use sand or salt on walkways during winter months.  In the garage, clean up any spills right away, including grease or oil spills. What other actions can I take?  Wear closed-toe shoes that fit well and support your feet. Wear shoes that have rubber soles or low heels.  Use mobility aids as needed, such as canes, walkers, scooters, and crutches.  Review your medicines with your  health care provider. Some medicines can cause dizziness or changes in blood pressure, which increase your risk of falling. Talk with your health care provider about other ways that you can decrease your risk of falls. This may include working with a physical therapist or trainer to improve your strength, balance, and endurance. Where to find more information  Centers for Disease Control and Prevention, STEADI: TVDivision.uy  [employer] on Aging: RingConnections.si Contact a health care provider if:  You are afraid of falling at home.  You feel weak, drowsy, or dizzy at home.  You fall at home. Summary  There are many simple things that you can do to make your home safe and to help prevent falls.  Ways to make your home safe include removing tripping hazards and installing grab bars in the bathroom.  Ask for help when making these changes in your home. This information is not intended to replace advice given to you by your health care provider. Make sure you discuss any questions you have with your health care provider. Document Revised: 11/10/2017 Document Reviewed: 07/13/2017 Elsevier Patient Education  2020 Elsevier Inc. Naproxen; Sumatriptan tablets What is this medicine? NAPROXEN; SUMATRIPTAN (na PROS en; soo ma TRIP tan) is used to treat migraines with or without aura. An aura is a strange feeling or visual disturbance that warns you of an attack. This medicine is not used to prevent migraines. This medicine may be used for other purposes; ask your health care provider or pharmacist if you have questions. COMMON BRAND NAME(S): Treximet What should I tell my health care provider before I take this medicine? They need to know if you have any of these conditions:  cigarette smoker  circulation problems in fingers and toes  coronary artery bypass graft (CABG) surgery within the past 2 weeks  diabetes  drink more than 3 alcohol-containing drinks per  day  heart disease  high blood pressure  high cholesterol  history of irregular heartbeat  history of stomach bleeding  history of stroke  kidney disease  liver disease  lung or breathing disease, like asthma  stomach or intestine problems  an unusual or allergic reaction to naproxen, aspirin, other NSAIDs, sumatriptan, other medicines, foods, dyes, or preservatives  pregnant or trying to get pregnant  breast-feeding How should I use this medicine? Take this medicine by mouth with a glass of water. Follow the directions on the prescription label. You can take it with or without food. If it upsets your stomach, take it with food. Do not cut, crush, or chew this medicine. Do not take it more often than directed. A special MedGuide will be given to you by the pharmacist with each prescription and refill.  Be sure to read this information carefully each time. Talk to your pediatrician regarding the use of this medicine in children. While this drug may be prescribed for children as young as 12 for selected conditions, precautions do apply. Overdosage: If you think you have taken too much of this medicine contact a poison control center or emergency room at once. NOTE: This medicine is only for you. Do not share this medicine with others. What if I miss a dose? This does not apply. This medicine is not for regular use. What may interact with this medicine? Do not take this medicine with any of the following medicines:  certain medicines for migraine headache like almotriptan, eletriptan, frovatriptan, naratriptan, rizatriptan, sumatriptan, zolmitriptan  cidofovir  ergot alkaloids like dihydroergotamine, ergonovine, ergotamine, methylergonovine  ketorolac  MAOIs like Carbex, Eldepryl, Marplan, Nardil, and Parnate This medicine may also interact with the following medications:  aspirin  certain medicines for blood pressure, heart disease, irregular heart beat  certain  medicines for depression, anxiety, or psychotic disorders  certain medicines that treat or prevent blood clots like warfarin, enoxaparin, dalteparin, apixaban, dabigatran, and rivaroxaban  cyclosporine  methotrexate  NSAIDs, medicines for pain and inflammation, like ibuprofen or naproxen  pemetrexed  probenecid This list may not describe all possible interactions. Give your health care provider a list of all the medicines, herbs, non-prescription drugs, or dietary supplements you use. Also tell them if you smoke, drink alcohol, or use illegal drugs. Some items may interact with your medicine. What should I watch for while using this medicine? Visit your healthcare provider for regular checks on your progress. Tell your healthcare provider if your symptoms do not start to get better or if they get worse. This medicine may cause serious skin reactions. They can happen weeks to months after starting the medicine. Contact your healthcare provider right away if you notice fevers or flu-like symptoms with a rash. The rash may be red or purple and then turn into blisters or peeling of the skin. Or, you might notice a red rash with swelling of the face, lips or lymph nodes in your neck or under your arms. You may get drowsy or dizzy. Do not drive, use machinery, or do anything that needs mental alertness until you know how this medicine affects you. Do not stand up or sit up quickly, especially if you are an older patient. This reduces the risk of dizzy or fainting spells. Alcohol may interfere with the effect of this medicine. Avoid alcoholic drinks. Do not take other medicines that contain aspirin, ibuprofen, or naproxen with this medicine. Side effects such as stomach upset, nausea, or ulcers may be more likely to occur. Many non-prescription medicines contain aspirin, ibuprofen, or naproxen. Always read labels carefully. This medicine can cause ulcers and bleeding in the stomach and intestines at any  time during treatment. This can happen with no warning and may cause death. Smoking, drinking alcohol, older age, and poor health can also increase risks. Call your healthcare provider right away if you have stomach pain or blood in your vomit or stool. This medicine does not prevent a heart attack or stroke. This medicine may increase the chance of a heart attack or stroke. The chance may increase the longer you use this medicine or if you have heart disease. If you take aspirin to prevent a heart attack or stroke, talk to your healthcare provider about using this medicine. This medicine may increase your risk to bruise or bleed. Call your healthcare  provider if you notice any unusual bleeding. Tell your healthcare provider right away if you have any change in your eyesight. Your mouth may get dry. Chewing sugarless gum or sucking hard candy and drinking plenty of water may help. Contact your healthcare provider if the problem does not go away or is severe. If you take migraine medicines for 10 or more days a month, your migraines may get worse. Keep a diary of headache days and medicine use. Contact your healthcare provider if your migraine attacks occur more frequently. What side effects may I notice from receiving this medicine? Side effects that you should report to your doctor or health care professional as soon as possible:  allergic reactions like skin rash, itching or hives, swelling of the face, lips, or tongue  changes in vision  chest pain or chest tightness  redness, blistering, peeling, or loosening of the skin, including inside the mouth  signs and symptoms of a blood clot such as changes in vision; chest pain; severe, sudden headache; trouble speaking; sudden numbness or weakness of the face, arm, or leg  signs and symptoms of a dangerous change in heartbeat or heart rhythm like chest pain; dizziness; fast, irregular heartbeat; palpitations; feeling faint or lightheaded; falls;  breathing problems  signs and symptoms of a stroke like changes in vision; confusion; trouble speaking or understanding; severe headaches; sudden numbness or weakness of the face, arm or leg; trouble walking; dizziness; loss of balance or coordination  signs and symptoms of bleeding such as bloody or black, tarry stools; red or dark brown urine; spitting up blood or brown material that looks like coffee grounds; red spots on the skin; unusual bruising or bleeding from the eyes, gums, or nose  signs and symptoms of liver injury like dark yellow or brown urine; general ill feeling or flu-like symptoms; light-colored stools; loss of appetite; nausea; right upper belly pain; unusually weak or tired; yellowing of the eyes or skin  signs and symptoms of serotonin syndrome like irritable; confusion; diarrhea; fast or irregular heartbeat; muscle twitching; stiff muscles; trouble walking; sweating; high fever; seizures; chills; vomiting Side effects that usually do not require medical attention (report to your doctor or health care professional if they continue or are bothersome):  diarrhea  dizziness  drowsiness  dry mouth  headache  nausea, vomiting  pain, tingling, numbness in the hands or feet  stomach pain This list may not describe all possible side effects. Call your doctor for medical advice about side effects. You may report side effects to FDA at 1-800-FDA-1088. Where should I keep my medicine? Keep out of the reach of children. Store at room temperature between 15 and 30 degrees C (59 and 86 degrees F). Throw away any unused medicine after the expiration date. NOTE: This sheet is a summary. It may not cover all possible information. If you have questions about this medicine, talk to your doctor, pharmacist, or health care provider.  2020 Elsevier/Gold Standard (2019-02-19 07:57:43)

## 2020-06-04 ENCOUNTER — Telehealth: Payer: Self-pay | Admitting: Family Medicine

## 2020-06-04 NOTE — Telephone Encounter (Signed)
[facility] returned call stating that they do not do blister pack medications

## 2020-06-04 NOTE — Telephone Encounter (Signed)
I spoke with patients son he stated the onkly medication that needs to be in th eblister pack is the Buspar and Namenda. Okay to sent? He also asked if you were able to fill out a Medical leave form for him to be out of work since he is caring for his mother.

## 2020-06-05 ENCOUNTER — Other Ambulatory Visit: Payer: Self-pay | Admitting: Family Medicine

## 2020-06-05 DIAGNOSIS — F0390 Unspecified dementia without behavioral disturbance: Secondary | ICD-10-CM

## 2020-06-05 DIAGNOSIS — F419 Anxiety disorder, unspecified: Secondary | ICD-10-CM

## 2020-06-05 MED ORDER — MEMANTINE HCL ER 28 MG PO CP24: 28.0000 mg | ORAL_CAPSULE | Freq: Every day | ORAL | 3 refills | Status: DC

## 2020-06-05 MED ORDER — BUSPIRONE HCL 5 MG PO TABS: 5.0000 mg | ORAL_TABLET | Freq: Every day | ORAL | 3 refills | Status: DC

## 2020-06-05 NOTE — Telephone Encounter (Signed)
Medication has been send to pharmacy; patient son stated he will bring the paper work when is has it. Thanks.

## 2020-07-01 ENCOUNTER — Other Ambulatory Visit: Payer: Self-pay

## 2020-07-01 ENCOUNTER — Telehealth (INDEPENDENT_AMBULATORY_CARE_PROVIDER_SITE_OTHER): Payer: Medicare HMO | Admitting: Family Medicine

## 2020-07-01 DIAGNOSIS — Z9181 History of falling: Secondary | ICD-10-CM | POA: Diagnosis not present

## 2020-07-01 DIAGNOSIS — R69 Illness, unspecified: Secondary | ICD-10-CM | POA: Diagnosis not present

## 2020-07-01 DIAGNOSIS — F419 Anxiety disorder, unspecified: Secondary | ICD-10-CM | POA: Diagnosis not present

## 2020-07-01 DIAGNOSIS — R413 Other amnesia: Secondary | ICD-10-CM | POA: Diagnosis not present

## 2020-07-01 DIAGNOSIS — Z09 Encounter for follow-up examination after completed treatment for conditions other than malignant neoplasm: Secondary | ICD-10-CM | POA: Diagnosis not present

## 2020-07-01 DIAGNOSIS — F0391 Unspecified dementia with behavioral disturbance: Secondary | ICD-10-CM | POA: Diagnosis not present

## 2020-07-01 NOTE — Progress Notes (Signed)
Virtual Visit via Telephone Note  I connected with Katelyn Sparks on 07/10/20 at  3:00 PM EDT by telephone and verified that I am speaking with the correct person using two identifiers.   I discussed the limitations, risks, security and privacy concerns of performing an evaluation and management service by telephone and the availability of in person appointments. I also discussed with the patient that there may be a patient responsible charge related to this service. The patient expressed understanding and agreed to proceed.  Televisit Today Patient Location: Home Provider Location: Office   History of Present Illness:  Past Surgical History:  Procedure Laterality Date  . ABDOMINAL HYSTERECTOMY    . COLONOSCOPY  11/2010  . UPPER GI ENDOSCOPY  11/2010   Social History   Socioeconomic History  . Marital status: Widowed    Spouse name: Not on file  . Number of children: 2  . Years of education: 72  . Highest education level: Not on file  Occupational History  . Occupation: Retired    Associate Professor: RETIRED  Tobacco Use  . Smoking status: Never Smoker  . Smokeless tobacco: Never Used  Vaping Use  . Vaping Use: Never used  Substance and Sexual Activity  . Alcohol use: No    Alcohol/week: 0.0 standard drinks  . Drug use: No  . Sexual activity: Not Currently  Other Topics Concern  . Not on file  Social History Narrative   She is a retired from a tobacco company.   Her husband passed away in 2000-04-08.  She has two son.   Patient is right handed.   Her grandson lives with her.   Patient drinks 1 cup of caffeine daily   Social Determinants of Health   Financial Resource Strain:   . Difficulty of Paying Living Expenses:   Food Insecurity:   . Worried About Programme researcher, broadcasting/film/video in the Last Year:   . Barista in the Last Year:   Transportation Needs:   . Freight forwarder (Medical):   Marland Kitchen Lack of Transportation (Non-Medical):   Physical Activity:   . Days of Exercise  per Week:   . Minutes of Exercise per Session:   Stress:   . Feeling of Stress :   Social Connections:   . Frequency of Communication with Friends and Family:   . Frequency of Social Gatherings with Friends and Family:   . Attends Religious Services:   . Active Member of Clubs or Organizations:   . Attends Banker Meetings:   Marland Kitchen Marital Status:   Intimate Partner Violence:   . Fear of Current or Ex-Partner:   . Emotionally Abused:   Marland Kitchen Physically Abused:   . Sexually Abused:     Past Medical History:  Diagnosis Date  . Blood in stool   . Closed dislocation of left shoulder 04/09/19  . Dementia (HCC)   . Esophageal reflux   . Flatulence, eructation, and gas pain   . Osteoarthrosis, unspecified whether generalized or localized, unspecified site   . Unspecified hemorrhoids without mention of complication     Family History  Problem Relation Age of Onset  . Hypertension Mother   . Diabetes Mother   . Diabetes Sister   . Diabetes Brother   . Diabetes Other   . Colon cancer Neg Hx   . Cancer Neg Hx   . Early death Neg Hx   . Heart disease Neg Hx   . Hyperlipidemia Neg Hx   .  Kidney disease Neg Hx   . Learning disabilities Neg Hx   . Stroke Neg Hx     No Known Allergies   Patient Active Problem List   Diagnosis Date Noted  . Sundowning 12/10/2019  . Agitation due to dementia (HCC) 12/10/2019  . Rash of both hands 12/10/2019  . Pre-diabetes 10/02/2019  . Memory changes 10/02/2019  . Anxiety 10/02/2019  . Toenail fungus 10/02/2019  . History of recent fall 04/04/2019  . Insomnia due to medical condition 08/20/2014  . Dementia without behavioral disturbance (HCC) 08/20/2014  . Chronic constipation 11/12/2013  . Hyperglycemia 08/28/2013  . Hyperlipidemia with target LDL less than 130 08/28/2013  . Other screening mammogram 08/28/2013  . Osteopenia 08/28/2013  . Routine general medical examination at a health care facility 08/28/2013  . GERD without  esophagitis 07/26/2010   Current Status: Since her last office visit, she is doing well with no complaints. She continues to struggle with memory deficits. Son is now seeking home health assessment. She denies fevers, chills, fatigue, recent infections, weight loss, and night sweats. She has not had any headaches, visual changes, dizziness, and falls. No chest pain, heart palpitations, cough and shortness of breath reported. Denies GI problems such as nausea, vomiting, diarrhea, and constipation. She has no reports of blood in stools, dysuria and hematuria. No depression or anxiety, and denies suicidal ideations, homicidal ideations, or auditory hallucinations. She is taking all medications as prescribed. He has recently requested blister packs for her medications. She denies pain today.   Observations/Objective:  Telephone Visit   Assessment and Plan:  1. Memory changes Stable. Not worsened.   2. Dementia with behavioral disturbance, unspecified dementia type (HCC)  3. History of recent fall  4. Anxiety  5. Follow up She will follow up in 6 months.   No orders of the defined types were placed in this encounter.   No orders of the defined types were placed in this encounter.   Referral Orders  No referral(s) requested today   Raliegh Ip,  MSN, FNP-BC [facility] [address] [place], [place] [address] [phone]- fax  I discussed the assessment and treatment plan with the patient. The patient was provided an opportunity to ask questions and all were answered. The patient agreed with the plan and demonstrated an understanding of the instructions.   The patient was advised to call back or seek an in-person evaluation if the symptoms worsen or if the condition fails to improve as anticipated.  I provided 20 minutes of non-face-to-face time during this  encounter.   Kallie Locks, FNP

## 2020-08-27 ENCOUNTER — Other Ambulatory Visit: Payer: Self-pay | Admitting: Family Medicine

## 2020-08-27 ENCOUNTER — Telehealth: Payer: Self-pay | Admitting: Family Medicine

## 2020-08-27 DIAGNOSIS — G4700 Insomnia, unspecified: Secondary | ICD-10-CM

## 2020-08-27 MED ORDER — TRAZODONE HCL 50 MG PO TABS: ORAL_TABLET | ORAL | 6 refills | Status: DC

## 2020-08-27 NOTE — Telephone Encounter (Signed)
Patient's son Rachna Schonberger) called to find out when the medication for sleep will be prescribed. It was discussed during the last visit, but no medication has been sent to the pharmacy.

## 2020-09-01 ENCOUNTER — Ambulatory Visit (INDEPENDENT_AMBULATORY_CARE_PROVIDER_SITE_OTHER): Payer: Medicare HMO | Admitting: Family Medicine

## 2020-09-01 ENCOUNTER — Encounter: Payer: Self-pay | Admitting: Family Medicine

## 2020-09-01 ENCOUNTER — Other Ambulatory Visit: Payer: Self-pay

## 2020-09-01 VITALS — BP 122/57 | HR 70 | Temp 97.0°F | Resp 16 | Ht 66.0 in | Wt 130.2 lb

## 2020-09-01 DIAGNOSIS — Z09 Encounter for follow-up examination after completed treatment for conditions other than malignant neoplasm: Secondary | ICD-10-CM

## 2020-09-01 DIAGNOSIS — N390 Urinary tract infection, site not specified: Secondary | ICD-10-CM

## 2020-09-01 DIAGNOSIS — R112 Nausea with vomiting, unspecified: Secondary | ICD-10-CM | POA: Diagnosis not present

## 2020-09-01 DIAGNOSIS — R413 Other amnesia: Secondary | ICD-10-CM | POA: Diagnosis not present

## 2020-09-01 DIAGNOSIS — G4700 Insomnia, unspecified: Secondary | ICD-10-CM

## 2020-09-01 DIAGNOSIS — F419 Anxiety disorder, unspecified: Secondary | ICD-10-CM

## 2020-09-01 DIAGNOSIS — F0391 Unspecified dementia with behavioral disturbance: Secondary | ICD-10-CM

## 2020-09-01 DIAGNOSIS — R8290 Unspecified abnormal findings in urine: Secondary | ICD-10-CM | POA: Diagnosis not present

## 2020-09-01 DIAGNOSIS — R69 Illness, unspecified: Secondary | ICD-10-CM | POA: Diagnosis not present

## 2020-09-01 LAB — POCT URINALYSIS DIPSTICK
Bilirubin, UA: NEGATIVE
Blood, UA: NEGATIVE
Glucose, UA: NEGATIVE
Ketones, UA: NEGATIVE
Nitrite, UA: NEGATIVE
Protein, UA: NEGATIVE
Spec Grav, UA: >=1.03 — AB (ref 1.010–1.025)
Urobilinogen, UA: 0.2 E.U./dL
pH, UA: 5.5 (ref 5.0–8.0)

## 2020-09-01 MED ORDER — ONDANSETRON 4 MG PO TBDP: 4.0000 mg | ORAL_TABLET | Freq: Three times a day (TID) | ORAL | 3 refills | Status: DC | PRN

## 2020-09-01 MED ORDER — SULFAMETHOXAZOLE-TRIMETHOPRIM 800-160 MG PO TABS: 1.0000 | ORAL_TABLET | Freq: Two times a day (BID) | ORAL | 0 refills | Status: DC

## 2020-09-01 MED ORDER — TRAZODONE HCL 50 MG PO TABS: ORAL_TABLET | ORAL | 6 refills | Status: DC

## 2020-09-01 NOTE — Progress Notes (Signed)
[facility]   Sick Visit  Subjective:  Patient ID: Katelyn Sparks, female    DOB: 09/14/1935  Age: 85 y.o. MRN: [id number]  CC:  Chief Complaint  Patient presents with  . Urinary Tract Infection    Pt son states she is feeling nausea for a while. Pt son states she is having some hallucinations. X3days.     HPI Katelyn Sparks is a 85 year old presents for Sick Visit today.   Past Medical History:  Diagnosis Date  . Blood in stool   . Closed dislocation of left shoulder 03/2019  . Dementia (HCC)   . Esophageal reflux   . Flatulence, eructation, and gas pain   . Osteoarthrosis, unspecified whether generalized or localized, unspecified site   . Unspecified hemorrhoids without mention of complication     Current Status: Since her last office visit, she is doing well with no complaints. Her sons state that she has been experiencing increased confusion since she arrived back home from beach trip with her son and his family. She is accompanied to her office visit by her other son who states that she had same symptoms when she was hospitalized for UTI. She denies urinary frequency, discharge, dysuria, urinary itching, burning, odor, hematuria, and suprapubic pain/discomfort. He states that she has also not been able to sleep lately. She denies fevers, chills, fatigue, recent infections, weight loss, and night sweats. She has not had any headaches, visual changes, dizziness, and falls. No chest pain, heart palpitations, cough and shortness of breath reported. Denies GI problems such as nausea, vomiting, diarrhea, and constipation. She has no reports of blood in stools. No depression or anxiety reported today, although patient does show signs of mild confusion and extended time to process her answers to questions. She is currently taking Namenda as prescribed.  She is taking all medications as prescribed. She denies pain today.   Past Surgical  History:  Procedure Laterality Date  . ABDOMINAL HYSTERECTOMY    . COLONOSCOPY  11/2010  . UPPER GI ENDOSCOPY  11/2010    Family History  Problem Relation Age of Onset  . Hypertension Mother   . Diabetes Mother   . Diabetes Sister   . Diabetes Brother   . Diabetes Other   . Colon cancer Neg Hx   . Cancer Neg Hx   . Early death Neg Hx   . Heart disease Neg Hx   . Hyperlipidemia Neg Hx   . Kidney disease Neg Hx   . Learning disabilities Neg Hx   . Stroke Neg Hx     Social History   Socioeconomic History  . Marital status: Widowed    Spouse name: Not on file  . Number of children: 2  . Years of education: 4912  . Highest education level: Not on file  Occupational History  . Occupation: Retired    Associate Professormployer: RETIRED  Tobacco Use  . Smoking status: Never Smoker  . Smokeless tobacco: Never Used  Vaping Use  . Vaping Use: Never used  Substance and Sexual Activity  . Alcohol use: No    Alcohol/week: 0.0 standard drinks  . Drug use: No  . Sexual activity: Not Currently  Other Topics Concern  . Not on file  Social History Narrative   She is a retired from a tobacco company.   Her husband passed away in 2001.  She has two son.   Patient is right handed.   Her  grandson lives with her.   Patient drinks 1 cup of caffeine daily   Social Determinants of Health   Financial Resource Strain:   . Difficulty of Paying Living Expenses: Not on file  Food Insecurity:   . Worried About Programme researcher, broadcasting/film/video in the Last Year: Not on file  . Ran Out of Food in the Last Year: Not on file  Transportation Needs:   . Lack of Transportation (Medical): Not on file  . Lack of Transportation (Non-Medical): Not on file  Physical Activity:   . Days of Exercise per Week: Not on file  . Minutes of Exercise per Session: Not on file  Stress:   . Feeling of Stress : Not on file  Social Connections:   . Frequency of Communication with Friends and Family: Not on file  . Frequency of Social  Gatherings with Friends and Family: Not on file  . Attends Religious Services: Not on file  . Active Member of Clubs or Organizations: Not on file  . Attends Banker Meetings: Not on file  . Marital Status: Not on file  Intimate Partner Violence:   . Fear of Current or Ex-Partner: Not on file  . Emotionally Abused: Not on file  . Physically Abused: Not on file  . Sexually Abused: Not on file    Outpatient Medications Prior to Visit  Medication Sig Dispense Refill  . busPIRone (BUSPAR) 5 MG tablet Take 1 tablet (5 mg total) by mouth daily. 30 tablet 3  . memantine (NAMENDA XR) 28 MG CP24 24 hr capsule Take 1 capsule (28 mg total) by mouth daily. 90 capsule 3  . naproxen (NAPROSYN) 500 MG tablet Take 1 tablet (500 mg total) by mouth 2 (two) times daily with a meal. 30 tablet 3  . Omega-3 Fatty Acids (FISH OIL) 1000 MG CAPS Take by mouth.    . traZODone (DESYREL) 50 MG tablet Take 2 tablets (total of 100 mg) at bedtime. 60 tablet 6   No facility-administered medications prior to visit.    No Known Allergies  ROS Review of Systems  Constitutional: Negative.   HENT: Negative.   Eyes: Negative.   Respiratory: Negative.   Cardiovascular: Negative.   Gastrointestinal: Negative.   Endocrine: Negative.   Genitourinary: Negative.   Musculoskeletal: Negative.   Skin: Negative.   Allergic/Immunologic: Negative.   Neurological: Positive for dizziness (occasional ), weakness and headaches (occasional ).  Hematological: Negative.   Psychiatric/Behavioral: Positive for confusion (moderate r/t Dementia) and sleep disturbance (insomnia). The patient is hyperactive.       Objective:    Physical Exam Vitals and nursing note reviewed.  Constitutional:      Appearance: Normal appearance.  HENT:     Head: Normocephalic and atraumatic.     Nose: Nose normal.     Mouth/Throat:     Mouth: Mucous membranes are moist.     Pharynx: Oropharynx is clear.  Cardiovascular:      Rate and Rhythm: Normal rate and regular rhythm.     Pulses: Normal pulses.     Heart sounds: Normal heart sounds.  Pulmonary:     Effort: Pulmonary effort is normal.     Breath sounds: Normal breath sounds.  Abdominal:     General: Abdomen is flat. Bowel sounds are normal.     Palpations: Abdomen is soft.  Musculoskeletal:        General: Normal range of motion.     Cervical back: Normal range of motion and  neck supple.  Skin:    General: Skin is warm and dry.  Neurological:     General: No focal deficit present.     Mental Status: She is alert and oriented to person, place, and time.  Psychiatric:     Comments: Moderate Dementia     BP (!) 122/57 (BP Location: Left Arm, Patient Position: Sitting, Cuff Size: Normal)   Pulse 70   Temp (!) 97 F (36.1 C)   Resp 16   Ht 5\' 6"  (1.676 m)   Wt 130 lb 3.2 oz (59.1 kg)   SpO2 100%   BMI 21.01 kg/m  Wt Readings from Last 3 Encounters:  09/01/20 130 lb 3.2 oz (59.1 kg)  06/01/20 128 lb 0.8 oz (58.1 kg)  01/10/20 125 lb (56.7 kg)     Health Maintenance Due  Topic Date Due  . OPHTHALMOLOGY EXAM  Never done  . URINE MICROALBUMIN  Never done  . FOOT EXAM  09/20/2018  . INFLUENZA VACCINE  07/12/2020    There are no preventive care reminders to display for this patient.  Lab Results  Component Value Date   TSH 1.55 03/06/2017   Lab Results  Component Value Date   WBC 5.3 01/10/2020   HGB 13.8 01/10/2020   HCT 39.5 01/10/2020   MCV 84.9 01/10/2020   PLT 182 01/10/2020   Lab Results  Component Value Date   NA 141 01/10/2020   K 4.0 01/10/2020   CO2 24 01/10/2020   GLUCOSE 133 (H) 01/10/2020   BUN 12 01/10/2020   CREATININE 1.09 (H) 01/10/2020   BILITOT 0.7 03/06/2017   ALKPHOS 65 03/06/2017   AST 18 03/06/2017   ALT 12 03/06/2017   PROT 7.7 03/06/2017   ALBUMIN 4.3 03/06/2017   CALCIUM 9.5 01/10/2020   ANIONGAP 11 01/10/2020   GFR 79.53 03/06/2017   Lab Results  Component Value Date   CHOL 292 (H)  03/06/2017   Lab Results  Component Value Date   HDL 69.10 03/06/2017   Lab Results  Component Value Date   LDLCALC 207 (H) 03/06/2017   Lab Results  Component Value Date   TRIG 76.0 03/06/2017   Lab Results  Component Value Date   CHOLHDL 4 03/06/2017   Lab Results  Component Value Date   HGBA1C 5.8 (A) 10/01/2019      Assessment & Plan:   1. Dementia with behavioral disturbance, unspecified dementia type (HCC) Memory testing given in office today in which patient scored a 18.   2. Memory changes  3. Insomnia, unspecified type Son's will make sure that Trazodone gets placed in blister packs today for better compliance. Contacted pharmacy to clarify.  - traZODone (DESYREL) 50 MG tablet; Take 2 tablets (total of 100 mg) at bedtime.  Dispense: 60 tablet; Refill: 6  4. Nausea and vomiting, intractability of vomiting not specified, unspecified vomiting type We will refill Zofran today.  - ondansetron (ZOFRAN-ODT) 4 MG disintegrating tablet; Take 1 tablet (4 mg total) by mouth every 8 (eight) hours as needed for nausea or vomiting.  Dispense: 30 tablet; Refill: 3  5. Anxiety  6. Urinary tract infection without hematuria, site unspecified - Urinalysis Dipstick  7. Abnormal urine finding - Urine Culture - sulfamethoxazole-trimethoprim (BACTRIM DS) 800-160 MG tablet; Take 1 tablet by mouth 2 (two) times daily.  Dispense: 14 tablet; Refill: 0  8. Follow up Keep follow up appointment as scheduled.   Meds ordered this encounter  Medications  . traZODone (DESYREL) 50 MG  tablet    Sig: Take 2 tablets (total of 100 mg) at bedtime.    Dispense:  60 tablet    Refill:  6  . ondansetron (ZOFRAN-ODT) 4 MG disintegrating tablet    Sig: Take 1 tablet (4 mg total) by mouth every 8 (eight) hours as needed for nausea or vomiting.    Dispense:  30 tablet    Refill:  3  . sulfamethoxazole-trimethoprim (BACTRIM DS) 800-160 MG tablet    Sig: Take 1 tablet by mouth 2 (two) times  daily.    Dispense:  14 tablet    Refill:  0    Orders Placed This Encounter  Procedures  . Urine Culture  . Urinalysis Dipstick    Referral Orders  No referral(s) requested today    Raliegh Ip,  MSN, FNP-BC [facility] [address] [place], [place] [address] [phone] fax   Problem List Items Addressed This Visit      Other   Anxiety   Relevant Medications   traZODone (DESYREL) 50 MG tablet   Memory changes    Other Visit Diagnoses    Dementia with behavioral disturbance, unspecified dementia type (HCC)    -  Primary   Relevant Medications   traZODone (DESYREL) 50 MG tablet   Insomnia, unspecified type       Relevant Medications   traZODone (DESYREL) 50 MG tablet   Nausea and vomiting, intractability of vomiting not specified, unspecified vomiting type       Relevant Medications   ondansetron (ZOFRAN-ODT) 4 MG disintegrating tablet   Urinary tract infection without hematuria, site unspecified       Relevant Medications   sulfamethoxazole-trimethoprim (BACTRIM DS) 800-160 MG tablet   Other Relevant Orders   Urinalysis Dipstick (Completed)   Abnormal urine finding       Relevant Medications   sulfamethoxazole-trimethoprim (BACTRIM DS) 800-160 MG tablet   Other Relevant Orders   Urine Culture   Follow up          Meds ordered this encounter  Medications  . traZODone (DESYREL) 50 MG tablet    Sig: Take 2 tablets (total of 100 mg) at bedtime.    Dispense:  60 tablet    Refill:  6  . ondansetron (ZOFRAN-ODT) 4 MG disintegrating tablet    Sig: Take 1 tablet (4 mg total) by mouth every 8 (eight) hours as needed for nausea or vomiting.    Dispense:  30 tablet    Refill:  3  . sulfamethoxazole-trimethoprim (BACTRIM DS) 800-160 MG tablet    Sig: Take 1 tablet by mouth 2 (two) times daily.    Dispense:  14 tablet    Refill:  0    Follow-up: No  follow-ups on file.    Kallie Locks, FNP

## 2020-09-01 NOTE — Patient Instructions (Signed)
Urinary Tract Infection, Adult A urinary tract infection (UTI) is an infection of any part of the urinary tract. The urinary tract includes:  The kidneys.  The ureters.  The bladder.  The urethra. These organs make, store, and get rid of pee (urine) in the body. What are the causes? This is caused by germs (bacteria) in your genital area. These germs grow and cause swelling (inflammation) of your urinary tract. What increases the risk? You are more likely to develop this condition if:  You have a small, thin tube (catheter) to drain pee.  You cannot control when you pee or poop (incontinence).  You are female, and: ? You use these methods to prevent pregnancy:  A medicine that kills sperm (spermicide).  A device that blocks sperm (diaphragm). ? You have low levels of a female hormone (estrogen). ? You are pregnant.  You have genes that add to your risk.  You are sexually active.  You take antibiotic medicines.  You have trouble peeing because of: ? A prostate that is bigger than normal, if you are female. ? A blockage in the part of your body that drains pee from the bladder (urethra). ? A kidney stone. ? A nerve condition that affects your bladder (neurogenic bladder). ? Not getting enough to drink. ? Not peeing often enough.  You have other conditions, such as: ? Diabetes. ? A weak disease-fighting system (immune system). ? Sickle cell disease. ? Gout. ? Injury of the spine. What are the signs or symptoms? Symptoms of this condition include:  Needing to pee right away (urgently).  Peeing often.  Peeing small amounts often.  Pain or burning when peeing.  Blood in the pee.  Pee that smells bad or not like normal.  Trouble peeing.  Pee that is cloudy.  Fluid coming from the vagina, if you are female.  Pain in the belly or lower back. Other symptoms include:  Throwing up (vomiting).  No urge to eat.  Feeling mixed up (confused).  Being tired  and grouchy (irritable).  A fever.  Watery poop (diarrhea). How is this treated? This condition may be treated with:  Antibiotic medicine.  Other medicines.  Drinking enough water. Follow these instructions at home:  Medicines  Take over-the-counter and prescription medicines only as told by your doctor.  If you were prescribed an antibiotic medicine, take it as told by your doctor. Do not stop taking it even if you start to feel better. General instructions  Make sure you: ? Pee until your bladder is empty. ? Do not hold pee for a long time. ? Empty your bladder after sex. ? Wipe from front to back after pooping if you are a female. Use each tissue one time when you wipe.  Drink enough fluid to keep your pee pale yellow.  Keep all follow-up visits as told by your doctor. This is important. Contact a doctor if:  You do not get better after 1-2 days.  Your symptoms go away and then come back. Get help right away if:  You have very bad back pain.  You have very bad pain in your lower belly.  You have a fever.  You are sick to your stomach (nauseous).  You are throwing up. Summary  A urinary tract infection (UTI) is an infection of any part of the urinary tract.  This condition is caused by germs in your genital area.  There are many risk factors for a UTI. These include having a small, thin   tube to drain pee and not being able to control when you pee or poop.  Treatment includes antibiotic medicines for germs.  Drink enough fluid to keep your pee pale yellow. This information is not intended to replace advice given to you by your health care provider. Make sure you discuss any questions you have with your health care provider. Document Revised: 11/15/2018 Document Reviewed: 06/07/2018 Elsevier Patient Education  [place]. Sulfamethoxazole; Trimethoprim, SMX-TMP tablets What is this medicine? SULFAMETHOXAZOLE; TRIMETHOPRIM or SMX-TMP (suhl fuh  meth OK suh zohl; trye METH oh prim) is a combination of a sulfonamide antibiotic and a second antibiotic, trimethoprim. It is used to treat or prevent certain kinds of bacterial infections. It will not work for colds, flu, or other viral infections. This medicine may be used for other purposes; ask your health care provider or pharmacist if you have questions. COMMON BRAND NAME(S): Bacter-Aid DS, Bactrim, Bactrim DS, Septra, Septra DS What should I tell my health care provider before I take this medicine? They need to know if you have any of these conditions:  G6PD deficiency  HIV or AIDS  kidney disease  liver disease  low platelets  low red blood cell counts  poor nutrition  stomach or intestine problems like colitis  thyroid disease  an unusual or allergic reaction to sulfamethoxazole, trimethoprim, sulfa drugs, other drugs, foods, dyes, or preservatives  pregnant or trying to get pregnant  breast-feeding How should I use this medicine? Take this medicine by mouth with a glass of water. Follow the directions on the prescription label. Take your medicine at regular intervals. Do not take it more often than directed. Take all of your medicine as directed even if you think you are better. Do not skip doses or stop your medicine early. Talk to your pediatrician regarding the use of this medicine in children. While this drug may be prescribed for children as young as 2 months for selected conditions, precautions do apply. Overdosage: If you think you have taken too much of this medicine contact a poison control center or emergency room at once. NOTE: This medicine is only for you. Do not share this medicine with others. What if I miss a dose? If you miss a dose, take it as soon as you can. If it is almost time for your next dose, take only that dose. Do not take double or extra doses. What may interact with this medicine? Do not take this medicine with any of the following  medications:  dofetilide This medicine may also interact with the following medications:  amantadine  birth control pills  certain medicines for blood pressure, heart disease  certain medicines for depression, like amitriptyline  certain medicines for diabetes, like glipizide or glyburide  certain medicines that treat or prevent blood clots like warfarin  cyclosporine  digoxin  diuretics  indomethacin  methotrexate  phenytoin  procainamide  pyrimethamine  zidovudine This list may not describe all possible interactions. Give your health care provider a list of all the medicines, herbs, non-prescription drugs, or dietary supplements you use. Also tell them if you smoke, drink alcohol, or use illegal drugs. Some items may interact with your medicine. What should I watch for while using this medicine? Tell your health care provider if your symptoms do not start to get better or if they get worse. Do not treat diarrhea with over the counter products. Contact your health care provider if you have diarrhea that lasts more than 2 days or if  it is severe and watery. This drug may cause serious skin reactions. They can happen weeks to months after starting the drug. Contact your health care provider right away if you notice fevers or flu-like symptoms with a rash. The rash may be red or purple and then turn into blisters or peeling of the skin. Or, you might notice a red rash with swelling of the face, lips or lymph nodes in your neck or under your arms. This drug can make you more sensitive to the sun. Keep out of the sun. If you cannot avoid being in the sun, wear protective clothing and sunscreen. Do not use sun lamps or tanning beds/booths. Be careful brushing or flossing your teeth or using a toothpick because you may get an infection or bleed more easily. If you have any dental work done, tell your dentist you are receiving this drug. What side effects may I notice from receiving  this medicine? Side effects that you should report to your doctor or health care professional as soon as possible:  allergic reactions (skin rash, itching or hives; swelling of the face, lips, or tongue)  bloody or watery diarrhea  heartbeat rhythm changes (trouble breathing; chest pain; dizziness; fast, irregular heartbeat; feeling faint or lightheaded, falls,)  fever  high potassium levels (chest pain; fast, irregular heartbeat; muscle weakness)  kidney injury (trouble passing urine or change in the amount of urine)  low blood sugar (feeling anxious; confusion; dizziness; increased hunger; unusually weak or tired; increased sweating; shakiness; cold, clammy skin; irritable; headache; blurred vision; fast heartbeat; loss of consciousness)  low red blood cell counts (trouble breathing; feeling faint; lightheaded, falls; unusually weak or tired)  rash, fever, and swollen lymph nodes  redness, blistering, peeling, or loosening of the skin, including inside the mouth  unusual bruising or bleeding Side effects that usually do not require medical attention (report to your doctor or health care professional if they continue or are bothersome):  loss of appetite  nausea  vomiting This list may not describe all possible side effects. Call your doctor for medical advice about side effects. You may report side effects to FDA at 1-800-FDA-1088. Where should I keep my medicine? Keep out of the reach of children. Store between 15 and 25 degrees C (59 to 77 degrees F). Protect from light. Keep the container tightly closed. Throw away any unused drug after the expiration date. NOTE: This sheet is a summary. It may not cover all possible information. If you have questions about this medicine, talk to your doctor, pharmacist, or health care provider.  2020 Elsevier/Gold Standard (2019-08-16 12:53:51) Ondansetron tablets What is this medicine? ONDANSETRON (on DAN se tron) is used to treat  nausea and vomiting caused by chemotherapy. It is also used to prevent or treat nausea and vomiting after surgery. This medicine may be used for other purposes; ask your health care provider or pharmacist if you have questions. COMMON BRAND NAME(S): Zofran What should I tell my health care provider before I take this medicine? They need to know if you have any of these conditions:  heart disease  history of irregular heartbeat  liver disease  low levels of magnesium or potassium in the blood  an unusual or allergic reaction to ondansetron, granisetron, other medicines, foods, dyes, or preservatives  pregnant or trying to get pregnant  breast-feeding How should I use this medicine? Take this medicine by mouth with a glass of water. Follow the directions on your prescription label. Take your doses  at regular intervals. Do not take your medicine more often than directed. Talk to your pediatrician regarding the use of this medicine in children. Special care may be needed. Overdosage: If you think you have taken too much of this medicine contact a poison control center or emergency room at once. NOTE: This medicine is only for you. Do not share this medicine with others. What if I miss a dose? If you miss a dose, take it as soon as you can. If it is almost time for your next dose, take only that dose. Do not take double or extra doses. What may interact with this medicine? Do not take this medicine with any of the following medications:  apomorphine  certain medicines for fungal infections like fluconazole, itraconazole, ketoconazole, posaconazole, voriconazole  cisapride  dronedarone  pimozide  thioridazine This medicine may also interact with the following medications:  carbamazepine  certain medicines for depression, anxiety, or psychotic disturbances  fentanyl  linezolid  MAOIs like Carbex, Eldepryl, Marplan, Nardil, and Parnate  methylene blue (injected into a  vein)  other medicines that prolong the QT interval (cause an abnormal heart rhythm) like dofetilide, ziprasidone  phenytoin  rifampicin  tramadol This list may not describe all possible interactions. Give your health care provider a list of all the medicines, herbs, non-prescription drugs, or dietary supplements you use. Also tell them if you smoke, drink alcohol, or use illegal drugs. Some items may interact with your medicine. What should I watch for while using this medicine? Check with your doctor or health care professional right away if you have any sign of an allergic reaction. What side effects may I notice from receiving this medicine? Side effects that you should report to your doctor or health care professional as soon as possible:  allergic reactions like skin rash, itching or hives, swelling of the face, lips or tongue  breathing problems  confusion  dizziness  fast or irregular heartbeat  feeling faint or lightheaded, falls  fever and chills  loss of balance or coordination  seizures  sweating  swelling of the hands or feet  tightness in the chest  tremors  unusually weak or tired Side effects that usually do not require medical attention (report to your doctor or health care professional if they continue or are bothersome):  constipation or diarrhea  headache This list may not describe all possible side effects. Call your doctor for medical advice about side effects. You may report side effects to FDA at 1-800-FDA-1088. Where should I keep my medicine? Keep out of the reach of children. Store between 2 and 30 degrees C (36 and 86 degrees F). Throw away any unused medicine after the expiration date. NOTE: This sheet is a summary. It may not cover all possible information. If you have questions about this medicine, talk to your doctor, pharmacist, or health care provider.  2020 Elsevier/Gold Standard (2018-11-20 07:16:43)

## 2020-09-03 LAB — URINE CULTURE

## 2020-09-04 ENCOUNTER — Encounter: Payer: Self-pay | Admitting: Family Medicine

## 2020-09-13 DIAGNOSIS — R110 Nausea: Secondary | ICD-10-CM | POA: Diagnosis not present

## 2020-09-13 DIAGNOSIS — R112 Nausea with vomiting, unspecified: Secondary | ICD-10-CM | POA: Diagnosis not present

## 2020-09-13 DIAGNOSIS — R404 Transient alteration of awareness: Secondary | ICD-10-CM | POA: Diagnosis not present

## 2020-09-19 DIAGNOSIS — Z9181 History of falling: Secondary | ICD-10-CM | POA: Diagnosis not present

## 2020-09-19 DIAGNOSIS — R32 Unspecified urinary incontinence: Secondary | ICD-10-CM | POA: Diagnosis not present

## 2020-09-19 DIAGNOSIS — R030 Elevated blood-pressure reading, without diagnosis of hypertension: Secondary | ICD-10-CM | POA: Diagnosis not present

## 2020-09-19 DIAGNOSIS — M1990 Unspecified osteoarthritis, unspecified site: Secondary | ICD-10-CM | POA: Diagnosis not present

## 2020-09-19 DIAGNOSIS — Z833 Family history of diabetes mellitus: Secondary | ICD-10-CM | POA: Diagnosis not present

## 2020-09-19 DIAGNOSIS — G309 Alzheimer's disease, unspecified: Secondary | ICD-10-CM | POA: Diagnosis not present

## 2020-09-19 DIAGNOSIS — R69 Illness, unspecified: Secondary | ICD-10-CM | POA: Diagnosis not present

## 2020-09-19 DIAGNOSIS — G4700 Insomnia, unspecified: Secondary | ICD-10-CM | POA: Diagnosis not present

## 2020-09-19 DIAGNOSIS — K219 Gastro-esophageal reflux disease without esophagitis: Secondary | ICD-10-CM | POA: Diagnosis not present

## 2020-09-19 DIAGNOSIS — Z8249 Family history of ischemic heart disease and other diseases of the circulatory system: Secondary | ICD-10-CM | POA: Diagnosis not present

## 2020-11-24 ENCOUNTER — Other Ambulatory Visit: Payer: Self-pay

## 2020-11-24 ENCOUNTER — Ambulatory Visit (INDEPENDENT_AMBULATORY_CARE_PROVIDER_SITE_OTHER): Payer: Medicare HMO | Admitting: Family Medicine

## 2020-11-24 ENCOUNTER — Encounter: Payer: Self-pay | Admitting: Family Medicine

## 2020-11-24 VITALS — BP 97/59 | HR 74 | Temp 97.8°F | Resp 20 | Ht 66.0 in | Wt 126.2 lb

## 2020-11-24 DIAGNOSIS — G8929 Other chronic pain: Secondary | ICD-10-CM

## 2020-11-24 DIAGNOSIS — F419 Anxiety disorder, unspecified: Secondary | ICD-10-CM

## 2020-11-24 DIAGNOSIS — R110 Nausea: Secondary | ICD-10-CM

## 2020-11-24 DIAGNOSIS — G4700 Insomnia, unspecified: Secondary | ICD-10-CM

## 2020-11-24 DIAGNOSIS — R42 Dizziness and giddiness: Secondary | ICD-10-CM | POA: Diagnosis not present

## 2020-11-24 DIAGNOSIS — Z09 Encounter for follow-up examination after completed treatment for conditions other than malignant neoplasm: Secondary | ICD-10-CM

## 2020-11-24 DIAGNOSIS — M25512 Pain in left shoulder: Secondary | ICD-10-CM

## 2020-11-24 DIAGNOSIS — R413 Other amnesia: Secondary | ICD-10-CM

## 2020-11-24 DIAGNOSIS — R69 Illness, unspecified: Secondary | ICD-10-CM | POA: Diagnosis not present

## 2020-11-24 MED ORDER — MECLIZINE HCL 12.5 MG PO TABS: 12.5000 mg | ORAL_TABLET | Freq: Three times a day (TID) | ORAL | 3 refills | Status: DC | PRN

## 2020-11-24 MED ORDER — DICLOFENAC SODIUM 1 % EX GEL: 4.0000 g | Freq: Four times a day (QID) | CUTANEOUS | 3 refills | Status: DC

## 2020-11-24 NOTE — Progress Notes (Signed)
[facility]    Established Patient Office Visit  Subjective:  Patient ID: Katelyn Sparks, female    DOB: [birth year]  Age: 85 y.o. MRN: [id number]  CC: No chief complaint on file.   HPI Katelyn Sparks is a 85 year old female who presents for Follow Up today.    Patient Active Problem List   Diagnosis Date Noted  . Sundowning 12/10/2019  . Agitation due to dementia (HCC) 12/10/2019  . Rash of both hands 12/10/2019  . Pre-diabetes 10/02/2019  . Memory changes 10/02/2019  . Anxiety 10/02/2019  . Toenail fungus 10/02/2019  . History of recent fall 04/04/2019  . Insomnia due to medical condition 08/20/2014  . Dementia without behavioral disturbance (HCC) 08/20/2014  . Chronic constipation 11/12/2013  . Hyperglycemia 08/28/2013  . Hyperlipidemia with target LDL less than 130 08/28/2013  . Other screening mammogram 08/28/2013  . Osteopenia 08/28/2013  . Routine general medical examination at a health care facility 08/28/2013  . GERD without esophagitis 07/26/2010    Current Status: Since her last office visit, she is doing well with no complaints.  She continues to have chronic right shoulder pain. She is accompanied by her son today. She denies fevers, chills, fatigue, recent infections, weight loss, and night sweats. She has not had any headaches, visual changes, dizziness, and falls. No chest pain, heart palpitations, cough and shortness of breath reported. Denies GI problems such as nausea, vomiting, diarrhea, and constipation. She has no reports of blood in stools, dysuria and hematuria. Memory deficits are stable lately, she continues to take Namenda daily as prescribed. All medications are in dose packets. No depression or anxiety reported today.  Past Medical History:  Diagnosis Date  . Blood in stool   . Closed dislocation of left shoulder 03/2019  . Dementia (HCC)   . Esophageal reflux   . Flatulence, eructation, and gas  pain   . Osteoarthrosis, unspecified whether generalized or localized, unspecified site   . Unspecified hemorrhoids without mention of complication     Past Surgical History:  Procedure Laterality Date  . ABDOMINAL HYSTERECTOMY    . COLONOSCOPY  11/2010  . UPPER GI ENDOSCOPY  11/2010    Family History  Problem Relation Age of Onset  . Hypertension Mother   . Diabetes Mother   . Diabetes Sister   . Diabetes Brother   . Diabetes Other   . Colon cancer Neg Hx   . Cancer Neg Hx   . Early death Neg Hx   . Heart disease Neg Hx   . Hyperlipidemia Neg Hx   . Kidney disease Neg Hx   . Learning disabilities Neg Hx   . Stroke Neg Hx     Social History   Socioeconomic History  . Marital status: Widowed    Spouse name: Not on file  . Number of children: 2  . Years of education: 37  . Highest education level: Not on file  Occupational History  . Occupation: Retired    Associate Professor: RETIRED  Tobacco Use  . Smoking status: Never Smoker  . Smokeless tobacco: Never Used  Vaping Use  . Vaping Use: Never used  Substance and Sexual Activity  . Alcohol use: No    Alcohol/week: 0.0 standard drinks  . Drug use: No  . Sexual activity: Not Currently  Other Topics Concern  . Not on file  Social History Narrative   She is a retired from a tobacco company.  Her husband passed away in 2001.  She has two son.   Patient is right handed.   Her grandson lives with her.   Patient drinks 1 cup of caffeine daily   Social Determinants of Health   Financial Resource Strain: Not on file  Food Insecurity: Not on file  Transportation Needs: Not on file  Physical Activity: Not on file  Stress: Not on file  Social Connections: Not on file  Intimate Partner Violence: Not on file    Outpatient Medications Prior to Visit  Medication Sig Dispense Refill  . busPIRone (BUSPAR) 5 MG tablet Take 1 tablet (5 mg total) by mouth daily. 30 tablet 3  . memantine (NAMENDA XR) 28 MG CP24 24 hr capsule  Take 1 capsule (28 mg total) by mouth daily. 90 capsule 3  . naproxen (NAPROSYN) 500 MG tablet Take 1 tablet (500 mg total) by mouth 2 (two) times daily with a meal. 30 tablet 3  . sulfamethoxazole-trimethoprim (BACTRIM DS) 800-160 MG tablet Take 1 tablet by mouth 2 (two) times daily. 14 tablet 0  . traZODone (DESYREL) 50 MG tablet Take 2 tablets (total of 100 mg) at bedtime. 60 tablet 6  . Omega-3 Fatty Acids (FISH OIL) 1000 MG CAPS Take by mouth.    . ondansetron (ZOFRAN-ODT) 4 MG disintegrating tablet Take 1 tablet (4 mg total) by mouth every 8 (eight) hours as needed for nausea or vomiting. (Patient not taking: Reported on 11/24/2020) 30 tablet 3   No facility-administered medications prior to visit.    No Known Allergies  ROS Review of Systems  Constitutional: Negative.   HENT: Negative.   Eyes: Negative.   Respiratory: Negative.   Cardiovascular: Negative.   Gastrointestinal: Negative.   Endocrine: Negative.   Genitourinary: Negative.   Musculoskeletal:       Left shoulder pain  Skin: Negative.   Allergic/Immunologic: Negative.   Neurological: Positive for dizziness (occasional ), weakness and headaches (occasional ).  Hematological: Negative.   Psychiatric/Behavioral: Positive for confusion (dementia).      Objective:    Physical Exam Vitals and nursing note reviewed. Exam conducted with a chaperone present (son).  Constitutional:      Appearance: Normal appearance.  HENT:     Head: Normocephalic and atraumatic.     Nose: Nose normal.     Mouth/Throat:     Mouth: Mucous membranes are moist.     Pharynx: Oropharynx is clear.  Cardiovascular:     Rate and Rhythm: Normal rate and regular rhythm.     Pulses: Normal pulses.     Heart sounds: Normal heart sounds.  Pulmonary:     Effort: Pulmonary effort is normal.     Breath sounds: Normal breath sounds.  Abdominal:     General: Bowel sounds are normal.     Palpations: Abdomen is soft.  Musculoskeletal:         General: Normal range of motion.     Cervical back: Normal range of motion and neck supple.  Skin:    General: Skin is warm and dry.  Neurological:     General: No focal deficit present.     Mental Status: She is alert and oriented to person, place, and time.  Psychiatric:        Thought Content: Thought content normal.        Judgment: Judgment normal.     Comments: dementia     BP (!) 97/59   Pulse 74   Temp 97.8 F (36.6 C)  Resp 20   Ht 5\' 6"  (1.676 m)   Wt 126 lb 3.2 oz (57.2 kg)   SpO2 99%   BMI 20.37 kg/m  Wt Readings from Last 3 Encounters:  11/24/20 126 lb 3.2 oz (57.2 kg)  09/01/20 130 lb 3.2 oz (59.1 kg)  06/01/20 128 lb 0.8 oz (58.1 kg)     Health Maintenance Due  Topic Date Due  . OPHTHALMOLOGY EXAM  Never done  . URINE MICROALBUMIN  Never done  . FOOT EXAM  09/20/2018  . INFLUENZA VACCINE  07/12/2020  . COVID-19 Vaccine (3 - Booster for Pfizer series) 09/03/2020    There are no preventive care reminders to display for this patient.  Lab Results  Component Value Date   TSH 1.070 11/24/2020   Lab Results  Component Value Date   WBC 4.5 11/24/2020   HGB 13.9 11/24/2020   HCT 40.7 11/24/2020   MCV 87 11/24/2020   PLT 232 11/24/2020   Lab Results  Component Value Date   NA 141 01/10/2020   K 4.0 01/10/2020   CO2 24 01/10/2020   GLUCOSE 133 (H) 01/10/2020   BUN 12 01/10/2020   CREATININE 1.09 (H) 01/10/2020   BILITOT 0.7 03/06/2017   ALKPHOS 65 03/06/2017   AST 18 03/06/2017   ALT 12 03/06/2017   PROT 7.7 03/06/2017   ALBUMIN 4.3 03/06/2017   CALCIUM 9.5 01/10/2020   ANIONGAP 11 01/10/2020   GFR 79.53 03/06/2017   Lab Results  Component Value Date   CHOL 314 (H) 11/24/2020   Lab Results  Component Value Date   HDL 78 11/24/2020   Lab Results  Component Value Date   LDLCALC 225 (H) 11/24/2020   Lab Results  Component Value Date   TRIG 72 11/24/2020   Lab Results  Component Value Date   CHOLHDL 4.0 11/24/2020   Lab  Results  Component Value Date   HGBA1C 5.8 (A) 10/01/2019   Assessment & Plan:   1. Memory changes Stable. Son reports no significant changes since initiation of Namenda. Monitor.   2. Dizziness - meclizine (ANTIVERT) 12.5 MG tablet; Take 1 tablet (12.5 mg total) by mouth 3 (three) times daily as needed for dizziness or nausea.  Dispense: 90 tablet; Refill: 3 - CBC with Differential - Comprehensive metabolic panel; Future - TSH - Lipid Panel - Vitamin B12 - Vitamin D, 25-hydroxy  3. Nausea We will initiate meclizine today.  - meclizine (ANTIVERT) 12.5 MG tablet; Take 1 tablet (12.5 mg total) by mouth 3 (three) times daily as needed for dizziness or nausea.  Dispense: 90 tablet; Refill: 3  4. Acute pain of left shoulder  5. Chronic left shoulder pain - diclofenac Sodium ([employer] DICLOFENAC SODIUM) 1 % GEL; Apply 4 g topically 4 (four) times daily.  Dispense: 4 g; Refill: 3  6. Insomnia, unspecified type  7. Anxiety Stable.   8. Follow up She will follow up in 6 months.   Meds ordered this encounter  Medications  . meclizine (ANTIVERT) 12.5 MG tablet    Sig: Take 1 tablet (12.5 mg total) by mouth 3 (three) times daily as needed for dizziness or nausea.    Dispense:  90 tablet    Refill:  3    Please discontinue Ondansetron. Please place Meclizine in dose packets. Thank you.  . diclofenac Sodium ([employer] DICLOFENAC SODIUM) 1 % GEL    Sig: Apply 4 g topically 4 (four) times daily.    Dispense:  4 g  Refill:  3    Orders Placed This Encounter  Procedures  . CBC with Differential  . Comprehensive metabolic panel  . TSH  . Lipid Panel  . Vitamin B12  . Vitamin D, 25-hydroxy    Referral Orders  No referral(s) requested today    Raliegh Ip, MSN, ANE, FNP-BC [facility] [address] [place], [place] [address] [phone] fax  Problem List Items Addressed This  Visit      Other   Anxiety   Memory changes - Primary    Other Visit Diagnoses    Dizziness       Relevant Medications   meclizine (ANTIVERT) 12.5 MG tablet   Other Relevant Orders   CBC with Differential (Completed)   Comprehensive metabolic panel   TSH (Completed)   Lipid Panel (Completed)   Vitamin B12 (Completed)   Vitamin D, 25-hydroxy (Completed)   Nausea       Relevant Medications   meclizine (ANTIVERT) 12.5 MG tablet   Acute pain of left shoulder       Chronic left shoulder pain       Relevant Medications   diclofenac Sodium ([employer] DICLOFENAC SODIUM) 1 % GEL   Insomnia, unspecified type       Follow up          Meds ordered this encounter  Medications  . meclizine (ANTIVERT) 12.5 MG tablet    Sig: Take 1 tablet (12.5 mg total) by mouth 3 (three) times daily as needed for dizziness or nausea.    Dispense:  90 tablet    Refill:  3    Please discontinue Ondansetron. Please place Meclizine in dose packets. Thank you.  . diclofenac Sodium ([employer] DICLOFENAC SODIUM) 1 % GEL    Sig: Apply 4 g topically 4 (four) times daily.    Dispense:  4 g    Refill:  3    Follow-up: No follow-ups on file.    Kallie Locks, FNP

## 2020-11-25 ENCOUNTER — Encounter: Payer: Self-pay | Admitting: Family Medicine

## 2020-11-25 ENCOUNTER — Telehealth: Payer: Self-pay | Admitting: Family Medicine

## 2020-11-25 ENCOUNTER — Other Ambulatory Visit: Payer: Self-pay | Admitting: Family Medicine

## 2020-11-25 DIAGNOSIS — E785 Hyperlipidemia, unspecified: Secondary | ICD-10-CM

## 2020-11-25 DIAGNOSIS — E559 Vitamin D deficiency, unspecified: Secondary | ICD-10-CM

## 2020-11-25 LAB — CBC WITH DIFFERENTIAL/PLATELET
Basophils Absolute: 0 10*3/uL (ref 0.0–0.2)
Basos: 0 %
EOS (ABSOLUTE): 0 10*3/uL (ref 0.0–0.4)
Eos: 0 %
Hematocrit: 40.7 % (ref 34.0–46.6)
Hemoglobin: 13.9 g/dL (ref 11.1–15.9)
Immature Grans (Abs): 0 10*3/uL (ref 0.0–0.1)
Immature Granulocytes: 0 %
Lymphocytes Absolute: 1.4 10*3/uL (ref 0.7–3.1)
Lymphs: 30 %
MCH: 29.8 pg (ref 26.6–33.0)
MCHC: 34.2 g/dL (ref 31.5–35.7)
MCV: 87 fL (ref 79–97)
Monocytes Absolute: 0.2 10*3/uL (ref 0.1–0.9)
Monocytes: 5 %
Neutrophils Absolute: 2.9 10*3/uL (ref 1.4–7.0)
Neutrophils: 65 %
Platelets: 232 10*3/uL (ref 150–450)
RBC: 4.67 x10E6/uL (ref 3.77–5.28)
RDW: 14.5 % (ref 11.7–15.4)
WBC: 4.5 10*3/uL (ref 3.4–10.8)

## 2020-11-25 LAB — LIPID PANEL
Chol/HDL Ratio: 4 ratio (ref 0.0–4.4)
Cholesterol, Total: 314 mg/dL — ABNORMAL HIGH (ref 100–199)
HDL: 78 mg/dL (ref >39)
LDL Chol Calc (NIH): 225 mg/dL — ABNORMAL HIGH (ref 0–99)
Triglycerides: 72 mg/dL (ref 0–149)
VLDL Cholesterol Cal: 11 mg/dL (ref 5–40)

## 2020-11-25 LAB — TSH: TSH: 1.07 u[IU]/mL (ref 0.450–4.500)

## 2020-11-25 LAB — VITAMIN D 25 HYDROXY (VIT D DEFICIENCY, FRACTURES): Vit D, 25-Hydroxy: 20.6 ng/mL — ABNORMAL LOW (ref 30.0–100.0)

## 2020-11-25 LAB — VITAMIN B12: Vitamin B-12: 1142 pg/mL (ref 232–1245)

## 2020-11-25 MED ORDER — VITAMIN D (ERGOCALCIFEROL) 1.25 MG (50000 UNIT) PO CAPS: 50000.0000 [IU] | ORAL_CAPSULE | ORAL | 3 refills | Status: DC

## 2020-11-25 MED ORDER — FISH OIL 1000 MG PO CAPS: 1000.0000 mg | ORAL_CAPSULE | Freq: Every day | ORAL | 3 refills | Status: DC

## 2020-11-25 NOTE — Telephone Encounter (Signed)
Spoke with son today r/t patient's recent lab results:  "Cholesterol levels are moderately elevated. Family declines additional medication, so she will begin Omega-3 Fish Oil daily. Continue low-fat, low cholesterol diet, increase fluids, increase fruits and vegetables. She will continue to decrease high sodium intake, excessive alcohol intake, increase potassium intake, smoking cessation, and increase physical activity of at least 30 minutes of cardio activity daily. She will continue to follow Heart Healthy or DASH diet.  Vitamin D level is extremely low. Rx for Vitamin D supplement sent to pharmacy today. Afterwards she will take daily MVI. She will take medication once weekly as prescribed. She should include foods that are high in Vitamin D. These include: Salmon, Cod Liver Oil, Mushrooms, Canned Fish, Milk, and Egg Yolks.   Patient has moderate Demetia. We will add to blister daily pill packs. Discussed in detail with son."

## 2020-12-08 ENCOUNTER — Other Ambulatory Visit: Payer: Self-pay | Admitting: Family Medicine

## 2020-12-08 DIAGNOSIS — F419 Anxiety disorder, unspecified: Secondary | ICD-10-CM

## 2020-12-08 NOTE — Telephone Encounter (Signed)
Is this okay to refill? 

## 2021-01-06 IMAGING — DX DG HIP (WITH OR WITHOUT PELVIS) 2-3V*R*
2 series · 2 of 2 positions shown · non-contrast
Comparison: None.

CLINICAL DATA: 85-year-old female with fall and right hip pain.

EXAM:
DG HIP (WITH OR WITHOUT PELVIS) 2-3V RIGHT

[hip ap]
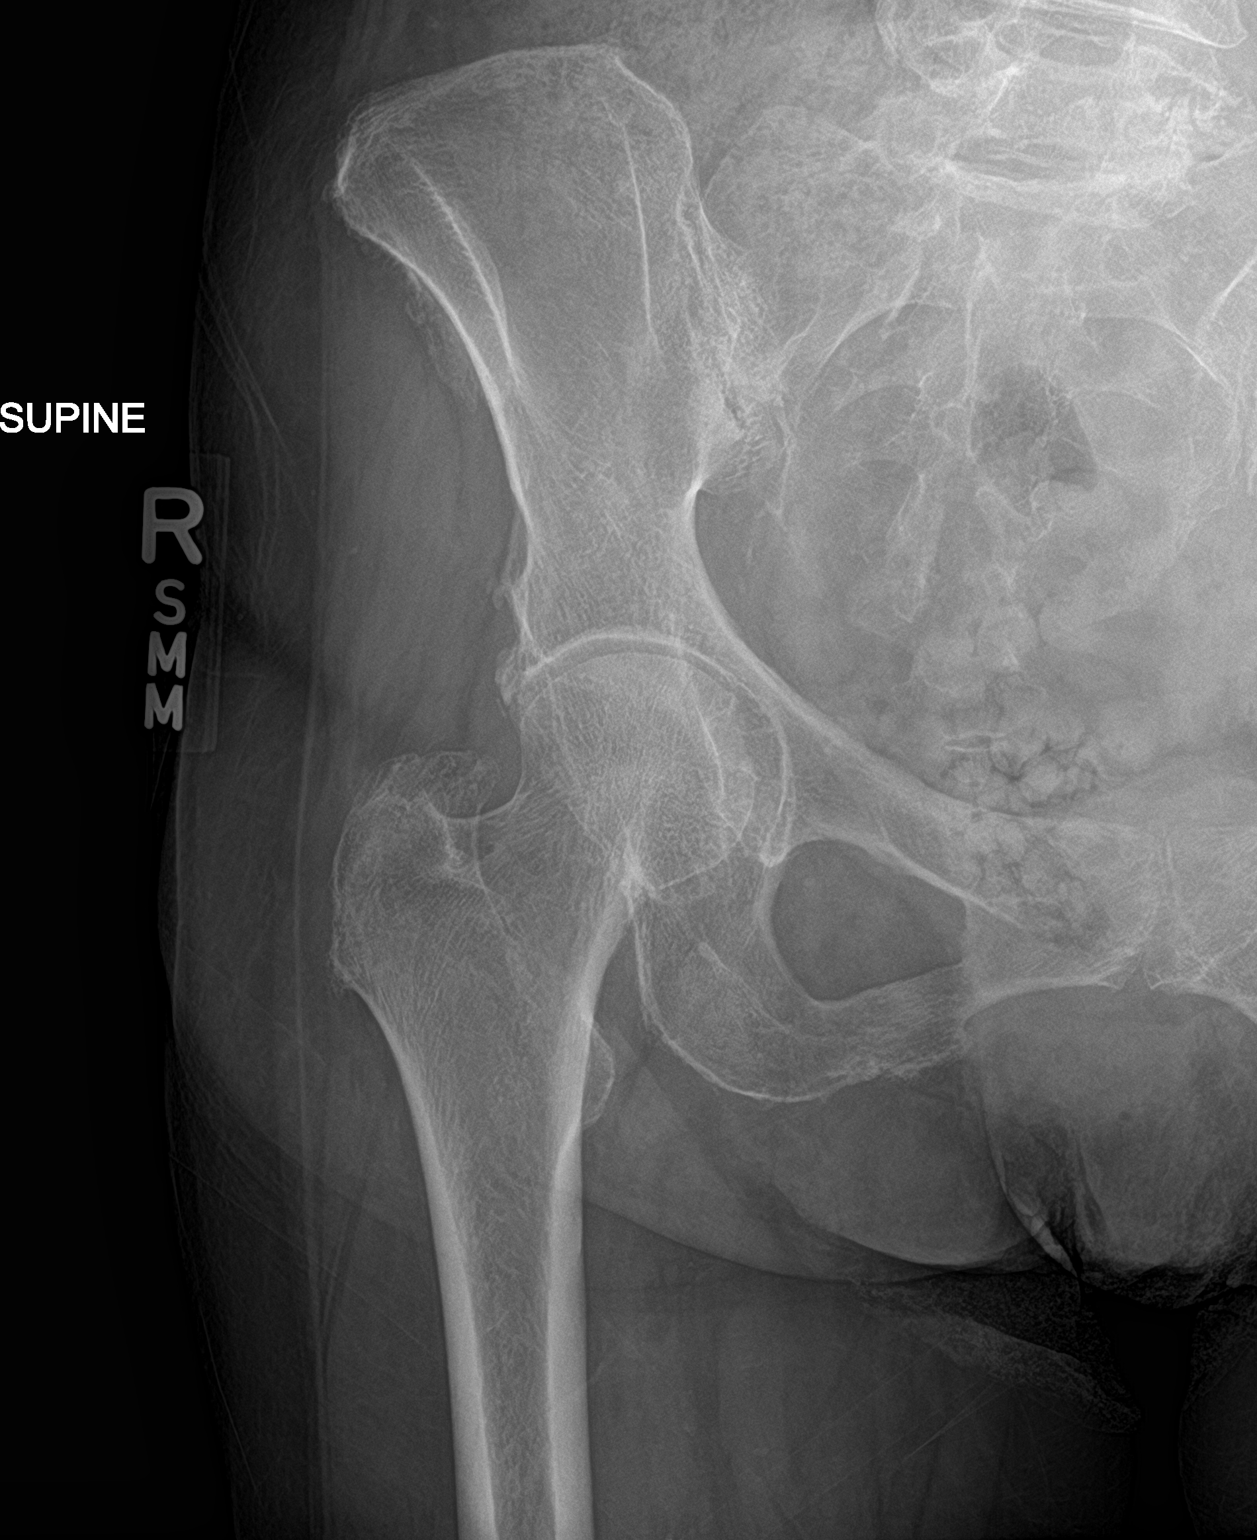

[hip lat]
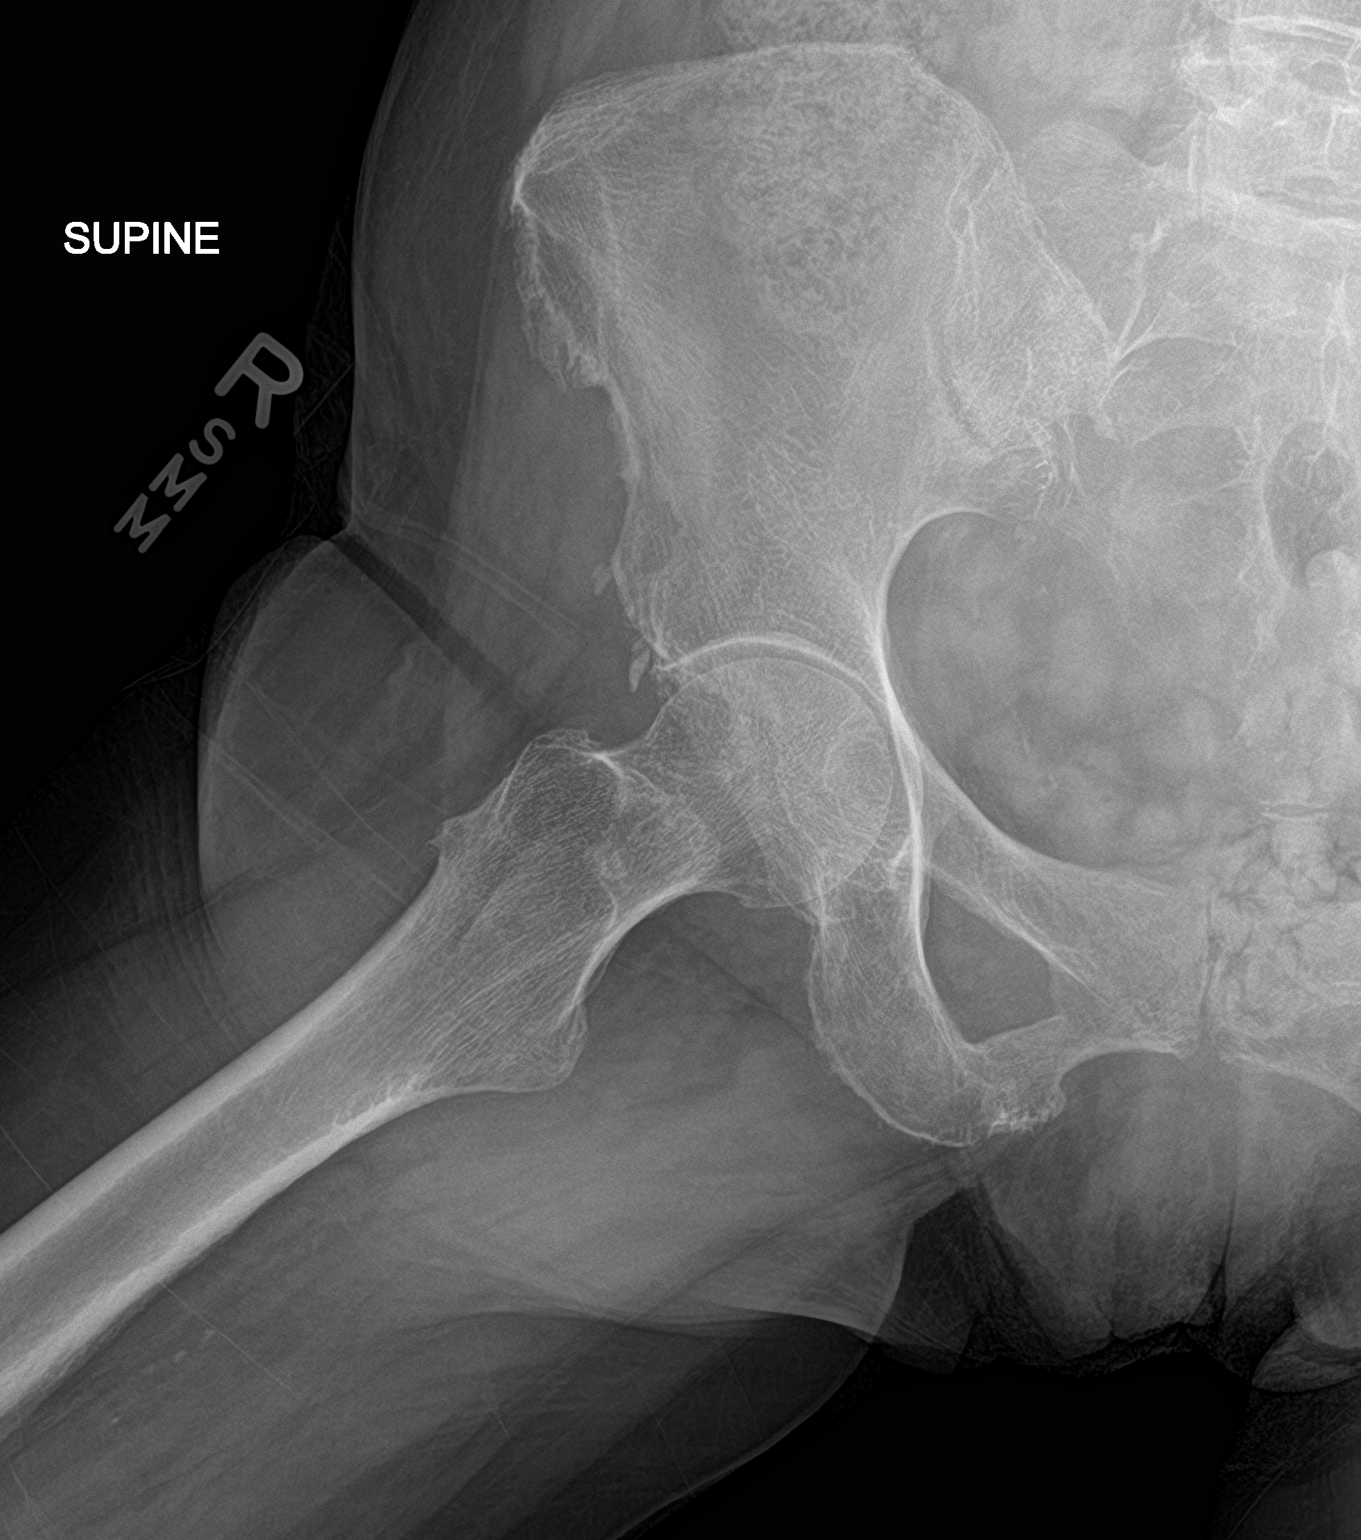

[2 of 2 positions shown; findings below may reference images not displayed]

FINDINGS: There is no acute fracture or dislocation. The bones are osteopenic.
There is mild arthritic changes of the right hip. The soft tissues
are unremarkable.
IMPRESSION: No acute fracture or dislocation.

## 2021-05-25 ENCOUNTER — Ambulatory Visit: Payer: Medicare HMO | Admitting: Family Medicine

## 2021-05-26 ENCOUNTER — Encounter: Payer: Self-pay | Admitting: Nurse Practitioner

## 2021-05-26 ENCOUNTER — Ambulatory Visit (INDEPENDENT_AMBULATORY_CARE_PROVIDER_SITE_OTHER): Payer: Medicare (Managed Care) | Admitting: Nurse Practitioner

## 2021-05-26 ENCOUNTER — Other Ambulatory Visit: Payer: Self-pay

## 2021-05-26 VITALS — BP 121/69 | HR 75 | Temp 97.5°F | Ht 66.0 in | Wt 133.0 lb

## 2021-05-26 DIAGNOSIS — E785 Hyperlipidemia, unspecified: Secondary | ICD-10-CM | POA: Diagnosis not present

## 2021-05-26 DIAGNOSIS — F0391 Unspecified dementia with behavioral disturbance: Secondary | ICD-10-CM

## 2021-05-26 LAB — POCT URINALYSIS DIPSTICK
Bilirubin, UA: NEGATIVE
Blood, UA: NEGATIVE
Glucose, UA: NEGATIVE
Ketones, UA: NEGATIVE
Leukocytes, UA: NEGATIVE
Nitrite, UA: NEGATIVE
Protein, UA: NEGATIVE
Spec Grav, UA: >=1.03 — AB (ref 1.010–1.025)
Urobilinogen, UA: 0.2 E.U./dL
pH, UA: 5.5 (ref 5.0–8.0)

## 2021-05-26 NOTE — Patient Instructions (Signed)
[email or web address]  Dementia Caregiver Guide Dementia is a term used to describe a number of symptoms that affect memory and thinking. The most common symptoms include: Memory loss. Trouble with language and communication. Trouble concentrating. Poor judgment and problems with reasoning. Wandering from home or public places. Extreme anxiety or depression. Being suspicious or having angry outbursts and accusations. Child-like behavior and language. Dementia can be frightening and confusing. And taking care of someone with dementia can be challenging. This guide provides tips to help you whenproviding care for a person with dementia. How to help manage lifestyle changes Dementia usually gets worse slowly over time. In the early stages, people with dementia can stay independent and safe with some help. In later stages, they need help with daily tasks such as dressing, grooming, and using the bathroom. There are actions you can take to help a person manage his or her life whileliving with this condition. Communicating When the person is talking or seems frustrated, make eye contact and hold the person's hand. Ask specific questions that need yes or no answers. Use simple words, short sentences, and a calm voice. Only give one direction at a time. When offering choices, limit the person to just one or two. Avoid correcting the person in a negative way. If the person is struggling to find the right words, gently try to help him or her. Preventing injury  Keep floors clear of clutter. Remove rugs, magazine racks, and floor lamps. Keep hallways well lit, especially at night. Put a handrail and nonslip mat in the bathtub or shower. Put childproof locks on cabinets that contain dangerous items, such as medicines, alcohol, guns, toxic cleaning items, sharp tools or utensils, matches, and lighters. For doors to the outside of the house, put the locks in  places where the person cannot see or reach them easily. This will help ensure that the person does not wander out of the house and get lost. Be prepared for emergencies. Keep a list of emergency phone numbers and addresses in a convenient area. Remove car keys and lock garage doors so that the person does not try to get in the car and drive. Have the person wear a bracelet that tracks locations and identifies the person as having memory problems. This should be worn at all times for safety.  Helping with daily life  Keep the person on track with his or her routine. Try to identify areas where the person may need help. Be supportive, patient, calm, and encouraging. Gently remind the person that adjusting to changes takes time. Help with the tasks that the person has asked for help with. Keep the person involved in daily tasks and decisions as much as possible. Encourage conversation, but try not to get frustrated if the person struggles to find words or does not seem to appreciate your help.  How to recognize stress Look for signs of stress in yourself and in the person you are caring for. If you notice signs of stress, take steps to manage it. Symptoms of stress include: Feeling anxious, irritable, frustrated, or angry. Denying that the person has dementia or that his or her symptoms will not improve. Feeling depressed, hopeless, or unappreciated. Difficulty sleeping. Difficulty concentrating. Developing stress-related health problems. Feeling like you have too little time for your own life. Follow these instructions at home: Take care of your health Make sure that you and the person you are caring for: Get regular sleep. Exercise regularly. Eat regular, nutritious meals. Take over-the-counter and prescription  medicines only as told by your health care providers. Drink enough fluid to keep your urine pale yellow. Attend all scheduled health care appointments.  General  instructions Join a support group with others who are caregivers. Ask about respite care resources. Respite care can provide short-term care for the person so that you can have a regular break from the stress of caregiving. Consider any safety risks and take steps to avoid them. Organize medicines in a pill box for each day of the week. Create a plan to handle any legal or financial matters. Get legal or financial advice if needed. Keep a calendar in a central location to remind the person of appointments or other activities. Where to find support: Many individuals and organizations offer support. These include: Support groups for people with dementia. Support groups for caregivers. Counselors or therapists. Home health care services. Adult day care centers. Where to find more information Centers for Disease Control and Prevention: FootballExhibition.com.br Alzheimer's Association: LimitLaws.hu Family Caregiver Alliance: www.caregiver.org Alzheimer's Foundation of [place]: www.alzfdn.org Contact a health care provider if: The person's health is rapidly getting worse. You are no longer able to care for the person. Caring for the person is affecting your physical and emotional health. You are feeling depressed or anxious about caring for the person. Get help right away if: The person threatens himself or herself, you, or anyone else. You feel depressed or sad, or feel that you want to harm yourself. If you ever feel like your loved one may hurt himself or herself or others, or if he or she shares thoughts about taking his or her own life, get help right away. You can go to your nearest emergency department or: Call your local emergency services (911 in the [place] Call a suicide crisis helpline, such as the National Suicide Prevention Lifeline at 908-139-0580. This is open 24 hours a day in the [place] Text the Crisis Text Line at (862)221-9726 (in the [place] Summary Dementia is a term used to describe a  number of symptoms that affect memory and thinking. Dementia usually gets worse slowly over time. Take steps to reduce the person's risk of injury and to plan for future care. Caregivers need support, relief from caregiving, and time for their own lives. This information is not intended to replace advice given to you by your health care provider. Make sure you discuss any questions you have with your healthcare provider. Document Revised: 04/13/2020 Document Reviewed: 04/13/2020 Elsevier Patient Education  2022 Elsevier Inc. Management of Memory Problems  There are some general things you can do to help manage your memory problems.  Your memory may not in fact recover, but by using techniques and strategies you will be able to manage your memory difficulties better.  1)  Establish a routine. Try to establish and then stick to a regular routine.  By doing this, you will get used to what to expect and you will reduce the need to rely on your memory.  Also, try to do things at the same time of day, such as taking your medication or checking your calendar first thing in the morning. Think about think that you can do as a part of a regular routine and make a list.  Then enter them into a daily planner to remind you.  This will help you establish a routine.  2)  Organize your environment. Organize your environment so that it is uncluttered.  Decrease visual stimulation.  Place everyday items such as keys or cell phone  in the same place every day (ie.  Basket next to front door) Use post it notes with a brief message to yourself (ie. Turn off light, lock the door) Use labels to indicate where things go (ie. Which cupboards are for food, dishes, etc.) Keep a notepad and pen by the telephone to take messages  3)  Memory Aids A diary or journal/notebook/daily planner Making a list (shopping list, chore list, to do list that needs to be done) Using an alarm as a reminder (kitchen timer or cell phone  alarm) Using cell phone to store information (Notes, Calendar, Reminders) Calendar/White board placed in a prominent position Post-it notes  In order for memory aids to be useful, you need to have good habits.  It's no good remembering to make a note in your journal if you don't remember to look in it.  Try setting aside a certain time of day to look in journal.  4)  Improving mood and managing fatigue. There may be other factors that contribute to memory difficulties.  Factors, such as anxiety, depression and tiredness can affect memory. Regular gentle exercise can help improve your mood and give you more energy. Simple relaxation techniques may help relieve symptoms of anxiety Try to get back to completing activities or hobbies you enjoyed doing in the past. Learn to pace yourself through activities to decrease fatigue. Find out about some local support groups where you can share experiences with others. Try and achieve 7-8 hours of sleep at night.

## 2021-05-26 NOTE — Progress Notes (Signed)
[facility] [address] [place], [place]  [address] Phone:  [phone]   Fax:  [phone]    Established Patient Office Visit  Subjective:  Patient ID: Katelyn Sparks, female    DOB: [birth year]  Age: 86 y.o. MRN: [id number]  CC:  Chief Complaint  Patient presents with   Follow-up    6 months follow up, no questions or concerns    HPI Katelyn Sparks presents for follow up. She  has a past medical history of Blood in stool, Closed dislocation of left shoulder (07-Apr-2019), Dementia (HCC), Esophageal reflux, Flatulence, eructation, and gas pain, Osteoarthrosis, unspecified whether generalized or localized, unspecified site, and Unspecified hemorrhoids without mention of complication.    Katelyn Sparks is here for follow up of elevated cholesterol. Compliance with treatment has been good. The patient exercises  she is active but no structured exercise . Patient denies muscle pain associated with her medications.   She has a diagnosis of dementia.  She exhibits short term memory loss. She is currently on Namenda XR 28 mg daily.  She is tolerating this without difficulty.  She is cared for by her sons.  There is no real concern other than she likes to apply soap to her hands without rinsing.  This of course cause skin dryness which causes her to fixate on more. Past Medical History:  Diagnosis Date   Blood in stool    Closed dislocation of left shoulder 07-Apr-2019   Dementia (HCC)    Esophageal reflux    Flatulence, eructation, and gas pain    Osteoarthrosis, unspecified whether generalized or localized, unspecified site    Unspecified hemorrhoids without mention of complication     Past Surgical History:  Procedure Laterality Date   ABDOMINAL HYSTERECTOMY     COLONOSCOPY  11/2010   UPPER GI ENDOSCOPY  11/2010    Family History  Problem Relation Age of Onset   Hypertension Mother    Diabetes Mother    Diabetes Sister    Diabetes Brother    Diabetes Other     Colon cancer Neg Hx    Cancer Neg Hx    Early death Neg Hx    Heart disease Neg Hx    Hyperlipidemia Neg Hx    Kidney disease Neg Hx    Learning disabilities Neg Hx    Stroke Neg Hx     Social History   Socioeconomic History   Marital status: Widowed    Spouse name: Not on file   Number of children: 2   Years of education: 19   Highest education level: Not on file  Occupational History   Occupation: Retired    Associate Professor: RETIRED  Tobacco Use   Smoking status: Never   Smokeless tobacco: Never  Vaping Use   Vaping Use: Never used  Substance and Sexual Activity   Alcohol use: No    Alcohol/week: 0.0 standard drinks   Drug use: No   Sexual activity: Not Currently  Other Topics Concern   Not on file  Social History Narrative   She is a retired from a tobacco company.   Her husband passed away in 2000-04-06.  She has two son.   Patient is right handed.   Her grandson lives with her.   Patient drinks 1 cup of caffeine daily   Social Determinants of Health   Financial Resource Strain: Not on file  Food Insecurity: Not on file  Transportation Needs: Not on file  Physical Activity: Not on file  Stress: Not on file  Social Connections: Not on file  Intimate Partner Violence: Not on file    Outpatient Medications Prior to Visit  Medication Sig Dispense Refill   diclofenac Sodium ([employer] DICLOFENAC SODIUM) 1 % GEL Apply 4 g topically 4 (four) times daily. 4 g 3   meclizine (ANTIVERT) 12.5 MG tablet Take 1 tablet (12.5 mg total) by mouth 3 (three) times daily as needed for dizziness or nausea. 90 tablet 3   memantine (NAMENDA XR) 28 MG CP24 24 hr capsule Take 1 capsule (28 mg total) by mouth daily. 90 capsule 3   Omega-3 Fatty Acids (FISH OIL) 1000 MG CAPS Take 1 capsule (1,000 mg total) by mouth daily. **Please add to patient blister packs. Thank you** 90 capsule 3   traZODone (DESYREL) 50 MG tablet Take 2 tablets (total of 100 mg) at bedtime. 60 tablet 6   Vitamin D,  Ergocalciferol, (DRISDOL) 1.25 MG (50000 UNIT) CAPS capsule Take 1 capsule (50,000 Units total) by mouth every 7 (seven) days. 5 capsule 3   busPIRone (BUSPAR) 5 MG tablet TAKE 1 TABLET (5 MG TOTAL) BY MOUTH DAILY. (Patient not taking: Reported on 05/26/2021) 90 tablet 3   naproxen (NAPROSYN) 500 MG tablet Take 1 tablet (500 mg total) by mouth 2 (two) times daily with a meal. (Patient not taking: Reported on 05/26/2021) 30 tablet 3   sulfamethoxazole-trimethoprim (BACTRIM DS) 800-160 MG tablet Take 1 tablet by mouth 2 (two) times daily. 14 tablet 0   No facility-administered medications prior to visit.    No Known Allergies  ROS Review of Systems    Objective:    Physical Exam Constitutional:      General: She is not in acute distress.    Appearance: She is normal weight. She is not ill-appearing, toxic-appearing or diaphoretic.  HENT:     Head: Normocephalic and atraumatic.     Nose: Nose normal.     Mouth/Throat:     Mouth: Mucous membranes are moist.  Cardiovascular:     Rate and Rhythm: Normal rate and regular rhythm.     Pulses: Normal pulses.     Heart sounds: Normal heart sounds.  Pulmonary:     Effort: Pulmonary effort is normal.     Breath sounds: Normal breath sounds.  Abdominal:     General: Bowel sounds are normal.     Palpations: Abdomen is soft.  Musculoskeletal:        General: Normal range of motion.     Cervical back: Normal range of motion.     Right lower leg: No edema.     Left lower leg: No edema.  Skin:    General: Skin is warm.     Capillary Refill: Capillary refill takes less than 2 seconds.     Comments: Dryness with discoloration to bilateral hands due to behavior  Neurological:     Mental Status: She is alert. Mental status is at baseline.     Comments: Oriented to person and son    BP 121/69 (BP Location: Right Arm, Patient Position: Sitting)   Pulse 75   Temp (!) 97.5 F (36.4 C)   Ht 5\' 6"  (1.676 m)   Wt 133 lb 0.2 oz (60.3 kg)   SpO2  99%   BMI 21.47 kg/m  Wt Readings from Last 3 Encounters:  05/26/21 133 lb 0.2 oz (60.3 kg)  11/24/20 126 lb 3.2 oz (57.2 kg)  09/01/20 130 lb 3.2 oz (  59.1 kg)     Health Maintenance Due  Topic Date Due   OPHTHALMOLOGY EXAM  Never done   URINE MICROALBUMIN  Never done   Zoster Vaccines- Shingrix (1 of 2) Never done   FOOT EXAM  09/20/2018   COVID-19 Vaccine (3 - Booster for Pfizer series) 08/03/2020    There are no preventive care reminders to display for this patient.  Lab Results  Component Value Date   TSH 1.070 11/24/2020   Lab Results  Component Value Date   WBC 4.5 11/24/2020   HGB 13.9 11/24/2020   HCT 40.7 11/24/2020   MCV 87 11/24/2020   PLT 232 11/24/2020   Lab Results  Component Value Date   NA 141 01/10/2020   K 4.0 01/10/2020   CO2 24 01/10/2020   GLUCOSE 133 (H) 01/10/2020   BUN 12 01/10/2020   CREATININE 1.09 (H) 01/10/2020   BILITOT 0.7 03/06/2017   ALKPHOS 65 03/06/2017   AST 18 03/06/2017   ALT 12 03/06/2017   PROT 7.7 03/06/2017   ALBUMIN 4.3 03/06/2017   CALCIUM 9.5 01/10/2020   ANIONGAP 11 01/10/2020   GFR 79.53 03/06/2017   Lab Results  Component Value Date   CHOL 314 (H) 11/24/2020   Lab Results  Component Value Date   HDL 78 11/24/2020   Lab Results  Component Value Date   LDLCALC 225 (H) 11/24/2020   Lab Results  Component Value Date   TRIG 72 11/24/2020   Lab Results  Component Value Date   CHOLHDL 4.0 11/24/2020   Lab Results  Component Value Date   HGBA1C 5.8 (A) 10/01/2019       Assessment  Primary Diagnosis & Pertinent Problem List: The primary encounter diagnosis was Hyperlipidemia, unspecified hyperlipidemia type. A diagnosis of Dementia with behavioral disturbance, unspecified dementia type [facility]) was also pertinent to this visit.  Visit Diagnosis: 1. Hyperlipidemia, unspecified hyperlipidemia type Poor control 1. Continue dietary measures. 2. Continue regular exercise. 3. Lipid-lowering  medications: fish oil 4. Follow up in 6 months.   2. Dementia with behavioral disturbance, unspecified dementia type (HCC)  Persistent however stable We will continue with Namenda at current dose Caregiver information provided     Barbette Merino, NP

## 2021-05-27 LAB — COMP. METABOLIC PANEL (12)
AST: 25 IU/L (ref 0–40)
Albumin/Globulin Ratio: 1.4 (ref 1.2–2.2)
Albumin: 4.6 g/dL (ref 3.6–4.6)
Alkaline Phosphatase: 107 IU/L (ref 44–121)
BUN/Creatinine Ratio: 23 (ref 12–28)
BUN: 21 mg/dL (ref 8–27)
Bilirubin Total: 0.4 mg/dL (ref 0.0–1.2)
Calcium: 9.7 mg/dL (ref 8.7–10.3)
Chloride: 101 mmol/L (ref 96–106)
Creatinine, Ser: 0.91 mg/dL (ref 0.57–1.00)
Globulin, Total: 3.4 g/dL (ref 1.5–4.5)
Glucose: 74 mg/dL (ref 65–99)
Potassium: 4.4 mmol/L (ref 3.5–5.2)
Sodium: 144 mmol/L (ref 134–144)
Total Protein: 8 g/dL (ref 6.0–8.5)
eGFR: 62 mL/min/{1.73_m2} (ref >59)

## 2021-05-27 LAB — LIPID PANEL
Chol/HDL Ratio: 4.3 ratio (ref 0.0–4.4)
Cholesterol, Total: 306 mg/dL — ABNORMAL HIGH (ref 100–199)
HDL: 72 mg/dL (ref >39)
LDL Chol Calc (NIH): 198 mg/dL — ABNORMAL HIGH (ref 0–99)
Triglycerides: 192 mg/dL — ABNORMAL HIGH (ref 0–149)
VLDL Cholesterol Cal: 36 mg/dL (ref 5–40)

## 2021-08-20 ENCOUNTER — Ambulatory Visit (INDEPENDENT_AMBULATORY_CARE_PROVIDER_SITE_OTHER): Payer: Medicare (Managed Care) | Admitting: Nurse Practitioner

## 2021-08-20 ENCOUNTER — Other Ambulatory Visit: Payer: Self-pay

## 2021-08-20 ENCOUNTER — Encounter: Payer: Self-pay | Admitting: Nurse Practitioner

## 2021-08-20 VITALS — BP 109/65 | HR 74 | Temp 97.4°F | Ht 65.0 in | Wt 130.2 lb

## 2021-08-20 DIAGNOSIS — R82998 Other abnormal findings in urine: Secondary | ICD-10-CM

## 2021-08-20 DIAGNOSIS — R413 Other amnesia: Secondary | ICD-10-CM

## 2021-08-20 DIAGNOSIS — Z23 Encounter for immunization: Secondary | ICD-10-CM | POA: Diagnosis not present

## 2021-08-20 DIAGNOSIS — R7303 Prediabetes: Secondary | ICD-10-CM | POA: Diagnosis not present

## 2021-08-20 DIAGNOSIS — F0390 Unspecified dementia without behavioral disturbance: Secondary | ICD-10-CM

## 2021-08-20 DIAGNOSIS — E785 Hyperlipidemia, unspecified: Secondary | ICD-10-CM | POA: Diagnosis not present

## 2021-08-20 LAB — POCT URINALYSIS DIP (CLINITEK)
Blood, UA: NEGATIVE
Glucose, UA: NEGATIVE mg/dL
Ketones, POC UA: NEGATIVE mg/dL
Nitrite, UA: NEGATIVE
Spec Grav, UA: >=1.03 — AB (ref 1.010–1.025)
Urobilinogen, UA: 0.2 E.U./dL
pH, UA: 5.5 (ref 5.0–8.0)

## 2021-08-20 MED ORDER — MEMANTINE HCL ER 28 MG PO CP24: 28.0000 mg | ORAL_CAPSULE | Freq: Every day | ORAL | 3 refills | Status: DC

## 2021-08-20 MED ORDER — NITROFURANTOIN MONOHYD MACRO 100 MG PO CAPS: 100.0000 mg | ORAL_CAPSULE | Freq: Two times a day (BID) | ORAL | 0 refills | Status: AC

## 2021-08-20 NOTE — Progress Notes (Signed)
Acute Office Visit  Subjective:    Patient ID: Katelyn Sparks, female    DOB: [birth year], 86 y.o.   MRN: [id number]  Chief Complaint  Patient presents with   Follow-up    Pt is here today for a follow up visit. Pt son states that the patient has been having hallucinations and thinks she may have a UTI. Pt son states the hallucinations has been going on x 2 wks.     HPI Patient is in today for acute visit. She  has a past medical history of Blood in stool, Closed dislocation of left shoulder (03/2019), Dementia ([place]), Esophageal reflux, Flatulence, eructation, and gas pain, Osteoarthrosis, unspecified whether generalized or localized, unspecified site, and Unspecified hemorrhoids without mention of complication.   She is in today with her son for some hallucinations for 2 weeks. He is concern that this maybe related to a UTI. He along with his brother are caregivers of their mother. He reports that overall care for her is maintained. It is difficulty when it comes to toileting and cleansing issues. Ms Cosens denies any urinary symptoms.   Past Medical History:  Diagnosis Date   Blood in stool    Closed dislocation of left shoulder 03/2019   Dementia (HCC)    Esophageal reflux    Flatulence, eructation, and gas pain    Osteoarthrosis, unspecified whether generalized or localized, unspecified site    Unspecified hemorrhoids without mention of complication     Past Surgical History:  Procedure Laterality Date   ABDOMINAL HYSTERECTOMY     COLONOSCOPY  11/2010   UPPER GI ENDOSCOPY  11/2010    Family History  Problem Relation Age of Onset   Hypertension Mother    Diabetes Mother    Diabetes Sister    Diabetes Brother    Diabetes Other    Colon cancer Neg Hx    Cancer Neg Hx    Early death Neg Hx    Heart disease Neg Hx    Hyperlipidemia Neg Hx    Kidney disease Neg Hx    Learning disabilities Neg Hx    Stroke Neg Hx     Social History   Socioeconomic History   Marital  status: Widowed    Spouse name: Not on file   Number of children: 2   Years of education: 26   Highest education level: Not on file  Occupational History   Occupation: Retired    Fish farm manager: RETIRED  Tobacco Use   Smoking status: Never   Smokeless tobacco: Never  Vaping Use   Vaping Use: Never used  Substance and Sexual Activity   Alcohol use: No    Alcohol/week: 0.0 standard drinks   Drug use: No   Sexual activity: Not Currently  Other Topics Concern   Not on file  Social History Narrative   She is a retired from a [employer].   Her husband passed away in 02/20/00.  She has two son.   Patient is right handed.   Her grandson lives with her.   Patient drinks 1 cup of caffeine daily   Social Determinants of Health   Financial Resource Strain: Not on file  Food Insecurity: Not on file  Transportation Needs: Not on file  Physical Activity: Not on file  Stress: Not on file  Social Connections: Not on file  Intimate Partner Violence: Not on file    Outpatient Medications Prior to Visit  Medication Sig Dispense Refill   meclizine (ANTIVERT) 12.5 MG tablet  Take 1 tablet (12.5 mg total) by mouth 3 (three) times daily as needed for dizziness or nausea. 90 tablet 3   Omega-3 Fatty Acids (FISH OIL) 1000 MG CAPS Take 1 capsule (1,000 mg total) by mouth daily. **Please add to patient blister packs. Thank you** 90 capsule 3   traZODone (DESYREL) 50 MG tablet Take 2 tablets (total of 100 mg) at bedtime. 60 tablet 6   Vitamin D, Ergocalciferol, (DRISDOL) 1.25 MG (50000 UNIT) CAPS capsule Take 1 capsule (50,000 Units total) by mouth every 7 (seven) days. 5 capsule 3   memantine (NAMENDA XR) 28 MG CP24 24 hr capsule Take 1 capsule (28 mg total) by mouth daily. 90 capsule 3   diclofenac Sodium ([employer] DICLOFENAC SODIUM) 1 % GEL Apply 4 g topically 4 (four) times daily. (Patient not taking: Reported on 08/20/2021) 4 g 3   naproxen (NAPROSYN) 500 MG tablet Take 1 tablet (500 mg total) by mouth 2  (two) times daily with a meal. (Patient not taking: No sig reported) 30 tablet 3   No facility-administered medications prior to visit.    No Known Allergies  Review of Systems     Objective:    Physical Exam Constitutional:      General: She is not in acute distress.    Appearance: She is normal weight. She is not ill-appearing, toxic-appearing or diaphoretic.  HENT:     Head: Normocephalic and atraumatic.     Nose: Nose normal.     Mouth/Throat:     Mouth: Mucous membranes are moist.  Cardiovascular:     Rate and Rhythm: Normal rate and regular rhythm.     Pulses: Normal pulses.     Heart sounds: Normal heart sounds.  Pulmonary:     Effort: Pulmonary effort is normal.     Breath sounds: Normal breath sounds.  Abdominal:     General: Bowel sounds are normal.     Palpations: Abdomen is soft.  Musculoskeletal:        General: Normal range of motion.     Cervical back: Normal range of motion.     Right lower leg: No edema.     Left lower leg: No edema.  Skin:    General: Skin is warm.     Capillary Refill: Capillary refill takes less than 2 seconds.  Neurological:     Mental Status: She is alert. Mental status is at baseline.     Comments: Oriented to person and son  Psychiatric:        Mood and Affect: Mood normal.    BP 109/65   Pulse 74   Temp (!) 97.4 F (36.3 C)   Ht _0  (1.651 m)   Wt 130 lb 3.2 oz (59.1 kg)   SpO2 99%   BMI 21.67 kg/m  Wt Readings from Last 3 Encounters:  08/20/21 130 lb 3.2 oz (59.1 kg)  05/26/21 133 lb 0.2 oz (60.3 kg)  11/24/20 126 lb 3.2 oz (57.2 kg)    Health Maintenance Due  Topic Date Due   OPHTHALMOLOGY EXAM  Never done    There are no preventive care reminders to display for this patient.   Lab Results  Component Value Date   TSH 1.070 11/24/2020   Lab Results  Component Value Date   WBC 4.5 11/24/2020   HGB 13.9 11/24/2020   HCT 40.7 11/24/2020   MCV 87 11/24/2020   PLT 232 11/24/2020   Lab Results   Component Value Date  NA 144 05/26/2021   K 4.4 05/26/2021   CO2 24 01/10/2020   GLUCOSE 74 05/26/2021   BUN 21 05/26/2021   CREATININE 0.91 05/26/2021   BILITOT 0.4 05/26/2021   ALKPHOS 107 05/26/2021   AST 25 05/26/2021   ALT 12 03/06/2017   PROT 8.0 05/26/2021   ALBUMIN 4.6 05/26/2021   CALCIUM 9.7 05/26/2021   ANIONGAP 11 01/10/2020   EGFR 62 05/26/2021   GFR 79.53 03/06/2017   Lab Results  Component Value Date   CHOL 306 (H) 05/26/2021   Lab Results  Component Value Date   HDL 72 05/26/2021   Lab Results  Component Value Date   LDLCALC 198 (H) 05/26/2021   Lab Results  Component Value Date   TRIG 192 (H) 05/26/2021   Lab Results  Component Value Date   CHOLHDL 4.3 05/26/2021   Lab Results  Component Value Date   HGBA1C 5.8 (A) 10/01/2019       Assessment & Plan:   Problem List Items Addressed This Visit       Other   Pre-diabetes Stable    Relevant Orders   Microalbumin, urine (Completed)   Memory changes - Primary Empirical anbx treatment due to symptoms and history.  Urine culture pending Encourage completion of anbx even when symptoms improve.  Discussed resistance with anbx overuse Discussed allergic reactions with anbx.  Encourage increasing hydration with water and how to tell when this is achieved Add cranberry juice 100% 8-16 ozs daily until symptoms improve Discussed hygiene  Family may benefit from home health aide in the future    Relevant Orders   URINALYSIS DIP (CLINITEK) (Completed)   Other Visit Diagnoses     Hyperlipidemia, unspecified hyperlipidemia type   Persistent       Relevant Orders   POCT URINALYSIS DIP (CLINITEK) (Completed)   Dementia arising in the senium and presenium (HCC)     Persistent   Relevant Medications   memantine (NAMENDA XR) 28 MG CP24 24 hr capsule   Flu vaccine need       Urine leukocytes       Relevant Orders   CBC with Differential/Platelet (Completed)        Meds ordered this  encounter  Medications   memantine (NAMENDA XR) 28 MG CP24 24 hr capsule    Sig: Take 1 capsule (28 mg total) by mouth daily.    Dispense:  90 capsule    Refill:  3    Please package in a blister pack.    Order Specific Question:   Supervising Provider    Answer:   Tresa Garter [[id number]]   nitrofurantoin, macrocrystal-monohydrate, (MACROBID) 100 MG capsule    Sig: Take 1 capsule (100 mg total) by mouth 2 (two) times daily for 5 days.    Dispense:  10 capsule    Refill:  0    Order Specific Question:   Supervising Provider    Answer:   Tresa Garter [[id number]]     Vevelyn Francois, NP

## 2021-08-20 NOTE — Patient Instructions (Signed)

## 2021-08-21 LAB — CBC WITH DIFFERENTIAL/PLATELET
Basophils Absolute: 0 10*3/uL (ref 0.0–0.2)
Basos: 0 %
EOS (ABSOLUTE): 0 10*3/uL (ref 0.0–0.4)
Eos: 1 %
Hematocrit: 40.6 % (ref 34.0–46.6)
Hemoglobin: 13.6 g/dL (ref 11.1–15.9)
Immature Grans (Abs): 0 10*3/uL (ref 0.0–0.1)
Immature Granulocytes: 0 %
Lymphocytes Absolute: 1.5 10*3/uL (ref 0.7–3.1)
Lymphs: 32 %
MCH: 28.9 pg (ref 26.6–33.0)
MCHC: 33.5 g/dL (ref 31.5–35.7)
MCV: 86 fL (ref 79–97)
Monocytes Absolute: 0.3 10*3/uL (ref 0.1–0.9)
Monocytes: 7 %
Neutrophils Absolute: 2.8 10*3/uL (ref 1.4–7.0)
Neutrophils: 60 %
Platelets: 228 10*3/uL (ref 150–450)
RBC: 4.7 x10E6/uL (ref 3.77–5.28)
RDW: 14.6 % (ref 11.7–15.4)
WBC: 4.6 10*3/uL (ref 3.4–10.8)

## 2021-08-21 LAB — MICROALBUMIN, URINE: Microalbumin, Urine: 54.8 ug/mL

## 2021-11-25 ENCOUNTER — Ambulatory Visit: Payer: Medicare (Managed Care) | Admitting: Nurse Practitioner

## 2022-01-01 DIAGNOSIS — Z1231 Encounter for screening mammogram for malignant neoplasm of breast: Secondary | ICD-10-CM | POA: Diagnosis not present

## 2022-04-20 ENCOUNTER — Encounter: Payer: Self-pay | Admitting: Nurse Practitioner

## 2022-04-20 ENCOUNTER — Ambulatory Visit (INDEPENDENT_AMBULATORY_CARE_PROVIDER_SITE_OTHER): Payer: Medicare Other | Admitting: Nurse Practitioner

## 2022-04-20 VITALS — BP 115/67 | HR 59 | Temp 98.2°F | Ht 65.0 in | Wt 132.6 lb

## 2022-04-20 DIAGNOSIS — L600 Ingrowing nail: Secondary | ICD-10-CM | POA: Diagnosis not present

## 2022-04-20 DIAGNOSIS — F0390 Unspecified dementia without behavioral disturbance: Secondary | ICD-10-CM | POA: Diagnosis not present

## 2022-04-20 DIAGNOSIS — G4700 Insomnia, unspecified: Secondary | ICD-10-CM | POA: Diagnosis not present

## 2022-04-20 DIAGNOSIS — R413 Other amnesia: Secondary | ICD-10-CM

## 2022-04-20 MED ORDER — MEMANTINE HCL ER 28 MG PO CP24: 28.0000 mg | ORAL_CAPSULE | Freq: Every day | ORAL | 3 refills | Status: DC

## 2022-04-20 MED ORDER — TRAZODONE HCL 50 MG PO TABS: ORAL_TABLET | ORAL | 6 refills | Status: DC

## 2022-04-20 NOTE — Progress Notes (Signed)
@Patient  ID: , female    DOB: Jan 11, 1935, 87 y.o.   MRN: [id number] ? ?Chief Complaint  ?Patient presents with  ? Follow-up  ?  Patient is here today for her follow up visit and medication refills. Patient son states that she has been having reoccurring UTI but thinks she is doing better due to them getting her to take baths more. Patient son also states that she is needing a referral to a podiatrist for her long toenails. Patient does have memory changes and is needing home health per her son.  ? ? ?Referring provider: ?No ref. provider found ? ?HPI ? ?Patient is in today for acute visit. She  has a past medical history of Blood in stool, Closed dislocation of left shoulder (03/2019), Dementia (HCC), Esophageal reflux, Flatulence, eructation, and gas pain, Osteoarthrosis.  ? ?Patient presents today for routine follow-up visit.  Her son is requesting a referral for a home health aide.  Patient does have dementia and is left at home alone during the daytime.  Patient states that it would be good to have someone to come and help her with her medications and meals.  We discussed that we will place a referral for this.  Handout was also given for resources for adult daycare.  Overall patient is doing well. Denies f/c/s, n/v/d, hemoptysis, PND, chest pain or edema. ? ?Patient's son is also requesting referral to podiatry for frequent ingrown toenails. ? ?No Known Allergies ? ?Immunization History  ?Administered Date(s) Administered  ? Influenza Split 08/23/2012  ? Influenza Whole 08/29/2010  ? Influenza, High Dose Seasonal PF 09/11/2016, 08/31/2019, 08/31/2019  ? Influenza,inj,Quad PF,6+ Mos 08/28/2013, 08/20/2014, 11/10/2015, 10/17/2018, 08/20/2021  ? Influenza-Unspecified 09/11/2019  ? PFIZER(Purple Top)SARS-COV-2 Vaccination 02/09/2020, 03/03/2020  ? Pneumococcal Conjugate-13 08/28/2013  ? Pneumococcal Polysaccharide-23 04/13/2015  ? Tdap 08/28/2013  ? ? ?Past Medical History:  ?Diagnosis Date  ? Blood in  stool   ? Closed dislocation of left shoulder 03/2019  ? Dementia (HCC)   ? Esophageal reflux   ? Flatulence, eructation, and gas pain   ? Osteoarthrosis, unspecified whether generalized or localized, unspecified site   ? Unspecified hemorrhoids without mention of complication   ? ? ?Tobacco History: ?Social History  ? ?Tobacco Use  ?Smoking Status Never  ?Smokeless Tobacco Never  ? ?Counseling given: Not Answered ? ? ?Outpatient Encounter Medications as of 04/20/2022  ?Medication Sig  ? meclizine (ANTIVERT) 12.5 MG tablet Take 1 tablet (12.5 mg total) by mouth 3 (three) times daily as needed for dizziness or nausea.  ? Omega-3 Fatty Acids (FISH OIL) 1000 MG CAPS Take 1 capsule (1,000 mg total) by mouth daily. **Please add to patient blister packs. Thank you**  ? Vitamin D, Ergocalciferol, (DRISDOL) 1.25 MG (50000 UNIT) CAPS capsule Take 1 capsule (50,000 Units total) by mouth every 7 (seven) days.  ? [DISCONTINUED] memantine (NAMENDA XR) 28 MG CP24 24 hr capsule Take 1 capsule (28 mg total) by mouth daily.  ? [DISCONTINUED] traZODone (DESYREL) 50 MG tablet Take 2 tablets (total of 100 mg) at bedtime.  ? diclofenac Sodium ([employer] DICLOFENAC SODIUM) 1 % GEL Apply 4 g topically 4 (four) times daily. (Patient not taking: Reported on 08/20/2021)  ? memantine (NAMENDA XR) 28 MG CP24 24 hr capsule Take 1 capsule (28 mg total) by mouth daily.  ? naproxen (NAPROSYN) 500 MG tablet Take 1 tablet (500 mg total) by mouth 2 (two) times daily with a meal. (Patient not taking: Reported on 05/26/2021)  ?  traZODone (DESYREL) 50 MG tablet Take 2 tablets (total of 100 mg) at bedtime.  ? [DISCONTINUED] omeprazole (PRILOSEC) 40 MG capsule Take 1 capsule (40 mg total) by mouth daily. (Patient not taking: Reported on 12/10/2019)  ? ?No facility-administered encounter medications on file as of 04/20/2022.  ? ? ? ?Review of Systems ? ?Review of Systems  ?Constitutional: Negative.   ?HENT: Negative.    ?Cardiovascular: Negative.   ?Gastrointestinal:  Negative.   ?Allergic/Immunologic: Negative.   ?Neurological: Negative.   ?Psychiatric/Behavioral:  Positive for confusion and decreased concentration.    ? ? ? ?Physical Exam ? ?BP 115/67   Pulse (!) 59   Temp 98.2 ?F (36.8 ?C)   Ht 5\' 5"  (1.651 m)   Wt 132 lb 9.6 oz (60.1 kg)   SpO2 100%   BMI 22.07 kg/m?  ? ?Wt Readings from Last 5 Encounters:  ?04/20/22 132 lb 9.6 oz (60.1 kg)  ?08/20/21 130 lb 3.2 oz (59.1 kg)  ?05/26/21 133 lb 0.2 oz (60.3 kg)  ?11/24/20 126 lb 3.2 oz (57.2 kg)  ?09/01/20 130 lb 3.2 oz (59.1 kg)  ? ? ? ?Physical Exam ?Vitals and nursing note reviewed.  ?Constitutional:   ?   General: She is not in acute distress. ?   Appearance: She is well-developed.  ?Cardiovascular:  ?   Rate and Rhythm: Normal rate and regular rhythm.  ?Pulmonary:  ?   Effort: Pulmonary effort is normal.  ?   Breath sounds: Normal breath sounds.  ?Neurological:  ?   Mental Status: She is alert and oriented to person, place, and time.  ? ? ? ?Lab Results: ? ?CBC ?   ?Component Value Date/Time  ? WBC 4.6 08/20/2021 1230  ? WBC 5.3 01/10/2020 1027  ? RBC 4.70 08/20/2021 1230  ? RBC 4.65 01/10/2020 1027  ? HGB 13.6 08/20/2021 1230  ? HCT 40.6 08/20/2021 1230  ? PLT 228 08/20/2021 1230  ? MCV 86 08/20/2021 1230  ? MCH 28.9 08/20/2021 1230  ? MCH 29.7 01/10/2020 1027  ? MCHC 33.5 08/20/2021 1230  ? MCHC 34.9 01/10/2020 1027  ? RDW 14.6 08/20/2021 1230  ? LYMPHSABS 1.5 08/20/2021 1230  ? MONOABS 0.3 01/10/2020 1027  ? EOSABS 0.0 08/20/2021 1230  ? BASOSABS 0.0 08/20/2021 1230  ? ? ?BMET ?   ?Component Value Date/Time  ? NA 144 05/26/2021 1212  ? K 4.4 05/26/2021 1212  ? CL 101 05/26/2021 1212  ? CO2 24 01/10/2020 1027  ? GLUCOSE 74 05/26/2021 1212  ? GLUCOSE 133 (H) 01/10/2020 1027  ? BUN 21 05/26/2021 1212  ? CREATININE 0.91 05/26/2021 1212  ? CALCIUM 9.7 05/26/2021 1212  ? GFRNONAA 47 (L) 01/10/2020 1027  ? GFRAA 55 (L) 01/10/2020 1027  ? ? ?BNP ?No results found for: BNP ? ?ProBNP ?No results found for:  PROBNP ? ?Imaging: ?No results found. ? ? ?Assessment & Plan:  ? ?Memory changes ?- Ambulatory referral to Home Health ? ?2. Dementia without behavioral disturbance (HCC) ? ?- Ambulatory referral to Home Health ?- CBC ?- Comprehensive metabolic panel ? ?3. Ingrown toenail of both feet ? ?- Ambulatory referral to Podiatry ? ?4. Dementia arising in the senium and presenium (HCC) ? ?- memantine (NAMENDA XR) 28 MG CP24 24 hr capsule; Take 1 capsule (28 mg total) by mouth daily.  Dispense: 90 capsule; Refill: 3 ? ?5. Insomnia, unspecified type ? ?- traZODone (DESYREL) 50 MG tablet; Take 2 tablets (total of 100 mg) at bedtime.  Dispense: 60 tablet;  Refill: 6 ? ? ? ?Follow up: ? ?Follow up in 3 months or sooner ? ? ? ? ?Ivonne Andrew, NP ?04/20/2022 ? ?

## 2022-04-20 NOTE — Assessment & Plan Note (Signed)
-   Ambulatory referral to Home Health ? ?2. Dementia without behavioral disturbance (HCC) ? ?- Ambulatory referral to Home Health ?- CBC ?- Comprehensive metabolic panel ? ?3. Ingrown toenail of both feet ? ?- Ambulatory referral to Podiatry ? ?4. Dementia arising in the senium and presenium (HCC) ? ?- memantine (NAMENDA XR) 28 MG CP24 24 hr capsule; Take 1 capsule (28 mg total) by mouth daily.  Dispense: 90 capsule; Refill: 3 ? ?5. Insomnia, unspecified type ? ?- traZODone (DESYREL) 50 MG tablet; Take 2 tablets (total of 100 mg) at bedtime.  Dispense: 60 tablet; Refill: 6 ? ? ? ?Follow up: ? ?Follow up in 3 months or sooner ? ?

## 2022-04-20 NOTE — Patient Instructions (Addendum)
1. Memory changes ? ?- Ambulatory referral to Home Health ? ?2. Dementia without behavioral disturbance (HCC) ? ?- Ambulatory referral to Home Health ?- CBC ?- Comprehensive metabolic panel ? ?3. Ingrown toenail of both feet ? ?- Ambulatory referral to Podiatry ? ?4. Dementia arising in the senium and presenium (HCC) ? ?- memantine (NAMENDA XR) 28 MG CP24 24 hr capsule; Take 1 capsule (28 mg total) by mouth daily.  Dispense: 90 capsule; Refill: 3 ? ?5. Insomnia, unspecified type ? ?- traZODone (DESYREL) 50 MG tablet; Take 2 tablets (total of 100 mg) at bedtime.  Dispense: 60 tablet; Refill: 6 ? ? ? ?Follow up: ? ?Follow up in 3 months or sooner ? ?

## 2022-04-21 LAB — CBC
Hematocrit: 37.2 % (ref 34.0–46.6)
Hemoglobin: 12.5 g/dL (ref 11.1–15.9)
MCH: 29.6 pg (ref 26.6–33.0)
MCHC: 33.6 g/dL (ref 31.5–35.7)
MCV: 88 fL (ref 79–97)
Platelets: 212 10*3/uL (ref 150–450)
RBC: 4.22 x10E6/uL (ref 3.77–5.28)
RDW: 14.6 % (ref 11.7–15.4)
WBC: 4.3 10*3/uL (ref 3.4–10.8)

## 2022-04-21 LAB — COMPREHENSIVE METABOLIC PANEL
ALT: 13 IU/L (ref 0–32)
AST: 19 IU/L (ref 0–40)
Albumin/Globulin Ratio: 1.4 (ref 1.2–2.2)
Albumin: 4.5 g/dL (ref 3.6–4.6)
Alkaline Phosphatase: 99 IU/L (ref 44–121)
BUN/Creatinine Ratio: 14 (ref 12–28)
BUN: 13 mg/dL (ref 8–27)
Bilirubin Total: 0.4 mg/dL (ref 0.0–1.2)
CO2: 23 mmol/L (ref 20–29)
Calcium: 9.3 mg/dL (ref 8.7–10.3)
Chloride: 103 mmol/L (ref 96–106)
Creatinine, Ser: 0.92 mg/dL (ref 0.57–1.00)
Globulin, Total: 3.2 g/dL (ref 1.5–4.5)
Glucose: 93 mg/dL (ref 70–99)
Potassium: 4.4 mmol/L (ref 3.5–5.2)
Sodium: 143 mmol/L (ref 134–144)
Total Protein: 7.7 g/dL (ref 6.0–8.5)
eGFR: 61 mL/min/{1.73_m2} (ref >59)

## 2022-04-27 ENCOUNTER — Ambulatory Visit: Payer: Medicare Other | Admitting: Podiatry

## 2022-05-19 ENCOUNTER — Ambulatory Visit: Payer: Medicare Other | Admitting: Nurse Practitioner

## 2022-06-02 ENCOUNTER — Ambulatory Visit: Payer: Medicare Other | Admitting: Podiatry

## 2022-06-02 ENCOUNTER — Encounter: Payer: Self-pay | Admitting: Podiatry

## 2022-06-02 DIAGNOSIS — M79675 Pain in left toe(s): Secondary | ICD-10-CM | POA: Diagnosis not present

## 2022-06-02 DIAGNOSIS — B351 Tinea unguium: Secondary | ICD-10-CM

## 2022-06-02 DIAGNOSIS — M79674 Pain in right toe(s): Secondary | ICD-10-CM

## 2022-09-06 ENCOUNTER — Ambulatory Visit (INDEPENDENT_AMBULATORY_CARE_PROVIDER_SITE_OTHER): Payer: Medicare Other | Admitting: Podiatry

## 2022-09-06 DIAGNOSIS — Z91199 Patient's noncompliance with other medical treatment and regimen due to unspecified reason: Secondary | ICD-10-CM

## 2022-09-09 NOTE — Progress Notes (Signed)
1. No-show for appointment     

## 2022-11-30 ENCOUNTER — Telehealth: Payer: Self-pay | Admitting: Nurse Practitioner

## 2022-11-30 NOTE — Telephone Encounter (Signed)
Left message for patient to call back and schedule Medicare Annual Wellness Visit (AWV).   Please offer to do virtually or by telephone.   Last AWV:  03/06/2017   Schedule at any time with [facility] Nurse Health Advisor   30 minute appointment for Virtual or phone  45 minute appointment for Initial virtual/phone  Any questions, please contact me at [phone]   Thank you,   [facility]  Ambulatory Clinical Support for Care Management [facility] You Are. We Are. [facility] ??[phone] or ??[phone]

## 2023-01-04 ENCOUNTER — Telehealth: Payer: Self-pay | Admitting: Nurse Practitioner

## 2023-01-04 NOTE — Telephone Encounter (Signed)
Left message for patient to call back and schedule Medicare Annual Wellness Visit (AWV) in office.   If not able to come in office, please offer to do virtually or by telephone.  Left office number and my jabber #[phone].  Last AWV:03/06/2017  Please schedule at anytime with Nurse Health Advisor.   

## 2023-02-23 ENCOUNTER — Telehealth: Payer: Self-pay | Admitting: Nurse Practitioner

## 2023-02-23 NOTE — Telephone Encounter (Signed)
Called patient to schedule Medicare Annual Wellness Visit (AWV). Left message for patient to call back and schedule Medicare Annual Wellness Visit (AWV).  Last date of AWV: 03/06/2017   Please schedule an AWVS appointment at any time with Annual Wellness Visit.  If any questions, please contact me at [phone].    Thank you,  [place] Direct dial  [phone]

## 2023-04-11 ENCOUNTER — Telehealth: Payer: Self-pay | Admitting: Nurse Practitioner

## 2023-04-11 NOTE — Telephone Encounter (Signed)
Called patient to schedule Medicare Annual Wellness Visit (AWV). Left message for patient to call back and schedule Medicare Annual Wellness Visit (AWV).   Please schedule an appointment at any time with Abby, NHA. .  If any questions, please contact me at [phone].  Thank you,  Judeth Cornfield,  AMB Clinical Support [facility] AWV Program Direct Dial ??[phone]

## 2023-07-24 ENCOUNTER — Other Ambulatory Visit: Payer: Self-pay | Admitting: Nurse Practitioner

## 2023-07-24 DIAGNOSIS — G4700 Insomnia, unspecified: Secondary | ICD-10-CM

## 2023-07-24 NOTE — Telephone Encounter (Signed)
Please advise Kh 

## 2023-07-26 ENCOUNTER — Other Ambulatory Visit: Payer: Self-pay | Admitting: Nurse Practitioner

## 2023-07-26 DIAGNOSIS — F0390 Unspecified dementia without behavioral disturbance: Secondary | ICD-10-CM

## 2023-08-23 ENCOUNTER — Other Ambulatory Visit: Payer: Self-pay

## 2023-08-23 ENCOUNTER — Encounter (HOSPITAL_COMMUNITY): Payer: Self-pay

## 2023-08-23 ENCOUNTER — Ambulatory Visit: Payer: Self-pay | Admitting: Nurse Practitioner

## 2023-08-23 ENCOUNTER — Inpatient Hospital Stay (HOSPITAL_COMMUNITY)
Admission: EM | Admit: 2023-08-23 | Discharge: 2023-08-28 | DRG: 312 | Disposition: A | Discharge status: Home Health | Payer: Medicare Other | Attending: Family Medicine | Admitting: Family Medicine

## 2023-08-23 DIAGNOSIS — Z833 Family history of diabetes mellitus: Secondary | ICD-10-CM

## 2023-08-23 DIAGNOSIS — R55 Syncope and collapse: Principal | ICD-10-CM | POA: Diagnosis present

## 2023-08-23 DIAGNOSIS — Z8249 Family history of ischemic heart disease and other diseases of the circulatory system: Secondary | ICD-10-CM

## 2023-08-23 DIAGNOSIS — E861 Hypovolemia: Secondary | ICD-10-CM | POA: Diagnosis present

## 2023-08-23 DIAGNOSIS — F03C11 Unspecified dementia, severe, with agitation: Secondary | ICD-10-CM | POA: Diagnosis present

## 2023-08-23 DIAGNOSIS — Z515 Encounter for palliative care: Secondary | ICD-10-CM

## 2023-08-23 DIAGNOSIS — Z79899 Other long term (current) drug therapy: Secondary | ICD-10-CM

## 2023-08-23 DIAGNOSIS — F03911 Unspecified dementia, unspecified severity, with agitation: Secondary | ICD-10-CM

## 2023-08-23 DIAGNOSIS — K219 Gastro-esophageal reflux disease without esophagitis: Secondary | ICD-10-CM | POA: Diagnosis present

## 2023-08-23 DIAGNOSIS — I421 Obstructive hypertrophic cardiomyopathy: Secondary | ICD-10-CM | POA: Diagnosis present

## 2023-08-23 DIAGNOSIS — E860 Dehydration: Secondary | ICD-10-CM

## 2023-08-23 DIAGNOSIS — F0390 Unspecified dementia without behavioral disturbance: Secondary | ICD-10-CM | POA: Diagnosis present

## 2023-08-23 LAB — CBC WITH DIFFERENTIAL/PLATELET
Abs Immature Granulocytes: 0.04 10*3/uL (ref 0.00–0.07)
Basophils Absolute: 0 10*3/uL (ref 0.0–0.1)
Basophils Relative: 0 %
Eosinophils Absolute: 0 10*3/uL (ref 0.0–0.5)
Eosinophils Relative: 0 %
HCT: 38.1 % (ref 36.0–46.0)
Hemoglobin: 13.1 g/dL (ref 12.0–15.0)
Immature Granulocytes: 0 %
Lymphocytes Relative: 7 %
Lymphs Abs: 0.7 10*3/uL (ref 0.7–4.0)
MCH: 29.1 pg (ref 26.0–34.0)
MCHC: 34.4 g/dL (ref 30.0–36.0)
MCV: 84.7 fL (ref 80.0–100.0)
Monocytes Absolute: 0.3 10*3/uL (ref 0.1–1.0)
Monocytes Relative: 3 %
Neutro Abs: 9.2 10*3/uL — ABNORMAL HIGH (ref 1.7–7.7)
Neutrophils Relative %: 90 %
Platelets: 212 10*3/uL (ref 150–400)
RBC: 4.5 MIL/uL (ref 3.87–5.11)
RDW: 13.8 % (ref 11.5–15.5)
WBC: 10.2 10*3/uL (ref 4.0–10.5)
nRBC: 0 % (ref 0.0–0.2)

## 2023-08-23 LAB — BASIC METABOLIC PANEL
Anion gap: 17 — ABNORMAL HIGH (ref 5–15)
BUN: 20 mg/dL (ref 8–23)
CO2: 20 mmol/L — ABNORMAL LOW (ref 22–32)
Calcium: 9.6 mg/dL (ref 8.9–10.3)
Chloride: 106 mmol/L (ref 98–111)
Creatinine, Ser: 1.12 mg/dL — ABNORMAL HIGH (ref 0.44–1.00)
GFR, Estimated: 48 mL/min — ABNORMAL LOW (ref >60)
Glucose, Bld: 119 mg/dL — ABNORMAL HIGH (ref 70–99)
Potassium: 4.2 mmol/L (ref 3.5–5.1)
Sodium: 143 mmol/L (ref 135–145)

## 2023-08-23 LAB — TROPONIN I (HIGH SENSITIVITY)
Troponin I (High Sensitivity): 31 ng/L — ABNORMAL HIGH (ref <18)
Troponin I (High Sensitivity): 39 ng/L — ABNORMAL HIGH (ref <18)

## 2023-08-23 MED ORDER — SODIUM CHLORIDE 0.9 % IV BOLUS
1000.0000 mL | Freq: Once | INTRAVENOUS | Status: AC
  Administered 2023-08-23: 1000 mL via INTRAVENOUS

## 2023-08-23 MED ORDER — HALOPERIDOL LACTATE 5 MG/ML IJ SOLN
2.0000 mg | Freq: Once | INTRAMUSCULAR | Status: AC
  Administered 2023-08-23: 2 mg via INTRAVENOUS
  Filled 2023-08-23: qty 1

## 2023-08-23 MED ORDER — ENOXAPARIN SODIUM 40 MG/0.4ML IJ SOSY
40.0000 mg | PREFILLED_SYRINGE | INTRAMUSCULAR | Status: DC
  Administered 2023-08-24 – 2023-08-28 (×5): 40 mg via SUBCUTANEOUS
  Filled 2023-08-23 (×5): qty 0.4

## 2023-08-23 MED ORDER — SODIUM CHLORIDE 0.9 % IV SOLN: INTRAVENOUS | Status: DC

## 2023-08-23 NOTE — ED Triage Notes (Signed)
Pt arrived via [facility] from home. Per EMS, pt's grandson was walking with pt to get to shower and pt had a syncopal episode. Pt had a second syncopal episode once EMS arrived in front of them. Per EMS, initial bp was 86 systolic. EMS gave NS .

## 2023-08-23 NOTE — ED Notes (Signed)
I notified new phlebotomist on duty pt needs blood work and it hasn't been collected yet and she said she will try

## 2023-08-23 NOTE — ED Notes (Signed)
I was unsuccessful with getting pt's blood work. I notified phlebotomy and they would try

## 2023-08-23 NOTE — Progress Notes (Incomplete)
Hospital Admission History and Physical Service Pager: [phone]  Patient name: Katelyn Sparks Medical record number: [id number] Date of Birth: 1934-12-27 Age: 88 y.o. Gender: female  Primary Care Provider: Ivonne Andrew, NP Consultants: None Code Status: Full code - discussed with son [name] Preferred Emergency Contact:  Contact Information     Name Relation Home Work Mobile   [place],Cedic Other [phone]     [place], [place] [phone]  [phone]      Other Contacts   None on File      Chief Complaint: Syncope  Assessment and Plan: Katelyn Sparks is a 88 y.o. female presenting after witnessed syncopal episode. Differential for this patient's presentation of this includes syncope due to dehydration (likely has poor p.o. intake in setting of dementia), orthostatic hypotension, arrhythmia (no arrhythmia noted on EKG), or vasovagal (possible as it was reported patient was upset about taking a shower).  EKG without concern for MI though trops are mildly elevated. Also considered electrolyte abnormalities, anemia, and hypoglycemia as contributing factors though less likely as labs were stable.  Will also consider seizure (no history of seizures) and intracranial abnormality (neuro exam reassuring, did not hit her head per report). Assessment & Plan Syncope and collapse Most likely causes include dehydration and orthostatic hypotension.  EKG nonconcerning for MI but Troponin is elevated 31>39, could be d/t hypovolemia.  Vitals and physical exam are reassuring. -Admit to FMTS, attending Dr. Marina Goodell -S/p 1 L fluid bolus x 2,  mIVF overnight -Orthostatic vitals - trend trops -Continue cardiac monitoring to monitor for arrhythmias -Consider echocardiogram  -AM CBC and BMP -fall precautions  -PT/OT -CTM Dementia with agitation, unspecified dementia severity, unspecified dementia type (HCC) Declining over past few years per family. Received haldol in ED d/t agitation/trying  to get out of bed.  -need to have GOC conversation with family  -fall precautions -delirium precautions -need to confirm medications with family (memantine and trazodone listed in computer)  Chronic and stable conditions:  GERD Osteoarthrosis   FEN/GI: Regular diet VTE Prophylaxis: Lovenox  Disposition: Med telemetry  History of Present Illness:  Katelyn Sparks is a 88 y.o. female presenting with syncope.  Pt is unable to provide history d/t dementia. Denies being in any pain but is cold.  I spoke on the phone with pt's son, [name] (lives out of town), who reports she had syncopal episode wittnessed by her grandson. [name] was told family members were trying to get her into the shower, she didn't want to and she passed out.  He is unsure how long she was out, if she completely lost consciousness.  He does states she did not hit her head per family's report to him.  [name] was told by family members that similar events have happened before.  States it would be better to talk with his brother, [place]edic [place] for more detailed information. I tried calling him but he did not pick up.  Per ED note, it was reported that patient felt lightheaded prior to falling. [name] has noticed both mental and physical decline in his mother over past few years.  [name] wishes her to be full code for now until he and his brother can further discuss.    In the ED, vitals stable, BMP w/ Cr 1.12 (base line ~0.9), GFR 48, CBC w/ diff WNL w/ exception of ANC 9.2., trop 31>39, EKG without ST elevation or arrhythmia. She was treated with 1 L NS bolus x2 and haldol  2 mg IV for agitation (continuously trying to get out of the bed)  Review Of Systems: Per HPI   Pertinent Past Medical History: Dementia GERD Osteoarthrosis Remainder reviewed in history tab.   Pertinent Past Surgical History: None pertinent Remainder reviewed in history tab.  Pertinent Social History: Tobacco use: need to talk to  family Alcohol use: need to talk to family Other Substance use: Need to talk to family Lives with son [name]  Pertinent Family History: Hypertension, diabetes  Remainder reviewed in history tab.   Important Outpatient Medications: Unclear, need to discuss with family Remainder reviewed in medication history.   Objective: BP 120/60 (BP Location: Right Arm)   Pulse 63   Temp (!) 97.5 F (36.4 C) (Oral)   Resp 19   Ht 5\' 5"  (1.651 m)   Wt 60.1 kg   SpO2 95%   BMI 22.05 kg/m  Exam: General: 88 year old female, lying in bed, shivering, NAD Eyes: White sclera, clear conjunctiva ENTM: Dry MM Neck: Supple Cardiovascular: RRR, no S1/S2 Respiratory: CTAB, normal effort Gastrointestinal: Bowel sounds present, soft, nontender palpation, nondistended MSK: 5/5 strength of BUEs and BLEs Derm: Warm and dry Neuro: Alert, oriented to person only. cranial nerves II through XII intact, sensation intact, finger-nose-finger test normal Psych: Pleasant and appropriate  Labs:  CBC BMET  Recent Labs  Lab 08/23/23 1535  WBC 10.2  HGB 13.1  HCT 38.1  PLT 212   Recent Labs  Lab 08/23/23 1535  NA 143  K 4.2  CL 106  CO2 20*  BUN 20  CREATININE 1.12*  GLUCOSE 119*  CALCIUM 9.6     EKG: My own interpretation (not copied from electronic read)  Regular rate, NSR, no ST elevation , Qtc prolonged at 511   Imaging Studies Performed: None   Katelyn Alley, DO 08/23/2023, 8:49 PM PGY-3, [facility]  FPTS Intern pager: [phone], text pages welcome Secure chat group [facility] Teaching Service

## 2023-08-23 NOTE — ED Notes (Signed)
Pt continues to regularly slide self towards end of stretcher despite multiple redirection attempts from various staff and fall precaution measures in place.  Pt redirectable for short periods of time but is fixated on thought that her son is coming to pick her up.

## 2023-08-23 NOTE — ED Notes (Signed)
Katelyn Sparks (son) [phone]

## 2023-08-23 NOTE — ED Provider Notes (Signed)
[place] EMERGENCY DEPARTMENT AT [facility] Provider Note   CSN: [id number] Arrival date & time: 08/23/23  1401     History  Chief Complaint  Patient presents with   Loss of Consciousness    Katelyn Sparks is a 88 y.o. female.  HPI Patient arrives from home via EMS after an episode of syncope x 2.  Patient has dementia, level 5 caveat. Patient cannot provide any substantial details to her history, but denies pain, dyspnea, nausea, weakness. History is obtained by EMS, patient's family members.    Home Medications Prior to Admission medications   Medication Sig Start Date End Date Taking? Authorizing Provider  diclofenac Sodium ([employer] DICLOFENAC SODIUM) 1 % GEL Apply 4 g topically 4 (four) times daily. Patient not taking: Reported on 08/20/2021 11/24/20   Kallie Locks, FNP  meclizine (ANTIVERT) 12.5 MG tablet Take 1 tablet (12.5 mg total) by mouth 3 (three) times daily as needed for dizziness or nausea. 11/24/20   Kallie Locks, FNP  memantine (NAMENDA XR) 28 MG CP24 24 hr capsule TAKE ONE CAPSULE BY MOUTH DAILY 07/27/23   Ivonne Andrew, NP  naproxen (NAPROSYN) 500 MG tablet Take 1 tablet (500 mg total) by mouth 2 (two) times daily with a meal. Patient not taking: Reported on 05/26/2021 06/01/20   Kallie Locks, FNP  Omega-3 Fatty Acids (FISH OIL) 1000 MG CAPS Take 1 capsule (1,000 mg total) by mouth daily. **Please add to patient blister packs. Thank you** 11/25/20   Kallie Locks, FNP  traZODone (DESYREL) 50 MG tablet TAKE TWO TABLETS BY MOUTH AT BEDTIME 07/26/23   Ivonne Andrew, NP  Vitamin D, Ergocalciferol, (DRISDOL) 1.25 MG (50000 UNIT) CAPS capsule Take 1 capsule (50,000 Units total) by mouth every 7 (seven) days. 11/25/20   Kallie Locks, FNP  omeprazole (PRILOSEC) 40 MG capsule Take 1 capsule (40 mg total) by mouth daily. Patient not taking: Reported on 12/10/2019 10/17/18 05/31/20  Kallie Locks, FNP      Allergies    Patient has no  known allergies.    Review of Systems   Review of Systems  All other systems reviewed and are negative.   Physical Exam Updated Vital Signs BP 123/72   Pulse (!) 59   Temp (!) 97 F (36.1 C) (Oral)   Resp (!) 23   Ht 5\' 5"  (1.651 m)   Wt 60.1 kg   SpO2 96%   BMI 22.05 kg/m  Physical Exam Vitals and nursing note reviewed.  Constitutional:      General: She is not in acute distress.    Appearance: She is well-developed.  HENT:     Head: Normocephalic and atraumatic.  Eyes:     Conjunctiva/sclera: Conjunctivae normal.  Cardiovascular:     Rate and Rhythm: Normal rate and regular rhythm.  Pulmonary:     Effort: Pulmonary effort is normal. No respiratory distress.     Breath sounds: Normal breath sounds. No stridor.  Abdominal:     General: There is no distension.  Skin:    General: Skin is warm and dry.  Neurological:     Mental Status: She is alert.     Cranial Nerves: No cranial nerve deficit.     Motor: Atrophy present. No abnormal muscle tone.  Psychiatric:        Mood and Affect: Mood normal.     ED Results / Procedures / Treatments   Labs (all labs ordered are listed, but  only abnormal results are displayed) Labs Reviewed  BASIC METABOLIC PANEL - Abnormal; Notable for the following components:      Result Value   CO2 20 (*)    Glucose, Bld 119 (*)    Creatinine, Ser 1.12 (*)    GFR, Estimated 48 (*)    Anion gap 17 (*)    All other components within normal limits  CBC WITH DIFFERENTIAL/PLATELET - Abnormal; Notable for the following components:   Neutro Abs 9.2 (*)    All other components within normal limits  CBG MONITORING, ED  TROPONIN I (HIGH SENSITIVITY)    EKG None  Radiology No results found.  Procedures Procedures    Medications Ordered in ED Medications - No data to display  ED Course/ Medical Decision Making/ A&P                                 Medical Decision Making Elderly female with dementia presents after witnessed  syncope.  History is obtained by the patient's son who is family members witnessed it.  She noted being lightheaded, then falling.  No noted substantial trauma, the patient denies any ankle pain. Broad differential including dehydration, electrolyte abnormalities, syncope orthostasis arrhythmia. Cardiac 60 sinus normal Pulse ox 96% room air normal   Amount and/or Complexity of Data Reviewed Independent Historian: EMS    Details: And son via telephone External Data Reviewed: notes. Labs: ordered. Decision-making details documented in ED Course. Radiology: ordered and independent interpretation performed. Decision-making details documented in ED Course. ECG/medicine tests: ordered and independent interpretation performed. Decision-making details documented in ED Course.  Risk Prescription drug management. Decision regarding hospitalization. Diagnosis or treatment significantly limited by social determinants of health.   On repeat exam patient in similar condition.  Elderly female presents after witnessed syncope.  Patient is awake and alert, has no pain, but her dementia is limiting for history. No obvious physical trauma, CT imaging unremarkable.  Labs suggesting dehydration, possibly contributing to her fall.  With otherwise reassuring findings of the patient received fluid resuscitation here she is discharged in stable condition.        Final Clinical Impression(s) / ED Diagnoses Final diagnoses:  Syncope and collapse  Dehydration    Rx / DC Orders ED Discharge Orders     None         Gerhard Munch, MD 08/23/23 [phone]

## 2023-08-23 NOTE — Progress Notes (Incomplete)
Hospital Admission History and Physical Service Pager: [phone]  Patient name: Katelyn Sparks Medical record number: [id number] Date of Birth: January 08, 1935 Age: 88 y.o. Gender: female  Primary Care Provider: Ivonne Andrew, NP Consultants: None Code Status: Full code - discussed with son [name] Preferred Emergency Contact:  Contact Information     Name Relation Home Work Mobile   [place],Cedic Other [phone]     [place], Ramus [phone]  [phone]      Other Contacts   None on File      Chief Complaint: Syncope  Assessment and Plan: Katelyn Sparks is a 88 y.o. female presenting after witnessed syncopal episode. Differential for this patient's presentation of this includes syncope due to dehydration (likely has poor p.o. intake in setting of dementia), orthostatic hypotension, arrhythmia (no arrhythmia noted on EKG), or vasovagal (possible as it was reported patient was upset about taking a shower).  EKG without concern for MI though trops are mildly elevated. Also considered electrolyte abnormalities, anemia, and hypoglycemia as contributing factors though less likely as labs were stable.  Will also consider seizure (no history of seizures) and intracranial abnormality (neuro exam reassuring, did not hit her head per report). Assessment & Plan Syncope and collapse Most likely causes include dehydration and orthostatic hypotension.  EKG nonconcerning for MI but Troponin is elevated 31>39, could be d/t hypovolemia.  Vitals and physical exam are reassuring. -Admit to FMTS, attending Dr. Marina Goodell -S/p 1 L fluid bolus x 2,  mIVF overnight -Orthostatic vitals - trend trops -Continue cardiac monitoring to monitor for arrhythmias -Consider echocardiogram  -AM CBC and BMP -fall precautions  -PT/OT -CTM Dementia with agitation, unspecified dementia severity, unspecified dementia type (HCC) Declining over past few years per family. Received haldol in ED d/t agitation/trying  to get out of bed.  -need to have GOC conversation with family  -fall precautions -delirium precautions -need to confirm medications with family (memantine and trazodone listed in computer)  Chronic and stable conditions:  GERD Osteoarthrosis   FEN/GI: Regular diet VTE Prophylaxis: Lovenox  Disposition: Med telemetry  History of Present Illness:  Katelyn Sparks is a 88 y.o. female presenting with syncope.  Pt is unable to provide history d/t dementia. Denies being in any pain but is cold.  I spoke on the phone with pt's son, [name] (lives out of town), who reports she had syncopal episode wittnessed by her grandson. [name] was told family members were trying to get her into the shower, she didn't want to and she passed out.  He is unsure how long she was out, if she completely lost consciousness.  He does states she did not hit her head per family's report to him.  [name] was told by family members that similar events have happened before.  States it would be better to talk with his brother, Cedic who lives in [place] for more detailed information. I tried calling him but he did not pick up.  Per ED note, it was reported that patient felt lightheaded prior to falling. [name] has noticed both mental and physical decline in his mother over past few years.  [name] wishes her to be full code for now until he and his brother can further discuss.    In the ED, vitals stable, BMP w/ Cr 1.12 (base line ~0.9), GFR 48, CBC w/ diff WNL w/ exception of ANC 9.2., trop 31>39, EKG without ST elevation or arrhythmia. She was treated with 1 L NS bolus x2 and haldol  2 mg IV for agitation (continuously trying to get out of the bed)  Review Of Systems: Per HPI   Pertinent Past Medical History: Dementia GERD Osteoarthrosis Remainder reviewed in history tab.   Pertinent Past Surgical History: None pertinent Remainder reviewed in history tab.  Pertinent Social History: Tobacco use: need to talk to  family Alcohol use: need to talk to family Other Substance use: Need to talk to family Lives with son Cedic Bi  Pertinent Family History: Hypertension, diabetes  Remainder reviewed in history tab.   Important Outpatient Medications: Unclear, need to discuss with family Remainder reviewed in medication history.   Objective: BP 120/60 (BP Location: Right Arm)   Pulse 63   Temp (!) 97.5 F (36.4 C) (Oral)   Resp 19   Ht 5\' 5"  (1.651 m)   Wt 60.1 kg   SpO2 95%   BMI 22.05 kg/m  Exam: General: 88 year old female, lying in bed, shivering, NAD Eyes: White sclera, clear conjunctiva ENTM: Dry MM Neck: Supple Cardiovascular: RRR, no S1/S2 Respiratory: CTAB, normal effort Gastrointestinal: Bowel sounds present, soft, nontender palpation, nondistended MSK: 5/5 strength of BUEs and BLEs Derm: Warm and dry Neuro: Alert, oriented to person only. cranial nerves II through XII intact, sensation intact, finger-nose-finger test normal Psych: Pleasant and appropriate  Labs:  CBC BMET  Recent Labs  Lab 08/23/23 1535  WBC 10.2  HGB 13.1  HCT 38.1  PLT 212   Recent Labs  Lab 08/23/23 1535  NA 143  K 4.2  CL 106  CO2 20*  BUN 20  CREATININE 1.12*  GLUCOSE 119*  CALCIUM 9.6     EKG: My own interpretation (not copied from electronic read)  Regular rate, NSR, no ST elevation , Qtc prolonged at 511 on EKG but 487 when I calculate it   Imaging Studies Performed: None   Erick Alley, DO 08/23/2023, 8:49 PM PGY-3, [facility]  FPTS Intern pager: [phone], text pages welcome Secure chat group [facility] Teaching Service

## 2023-08-23 NOTE — ED Notes (Signed)
Pt was stuck twice. Wasn't successful.

## 2023-08-24 ENCOUNTER — Observation Stay (HOSPITAL_COMMUNITY): Payer: Medicare Other

## 2023-08-24 DIAGNOSIS — R55 Syncope and collapse: Secondary | ICD-10-CM | POA: Diagnosis not present

## 2023-08-24 LAB — BLOOD GAS, VENOUS
Acid-base deficit: 2.9 mmol/L — ABNORMAL HIGH (ref 0.0–2.0)
Bicarbonate: 22.6 mmol/L (ref 20.0–28.0)
Drawn by: 59174
O2 Saturation: 60.1 %
Patient temperature: 36.3
pCO2, Ven: 40 mmHg — ABNORMAL LOW (ref 44–60)
pH, Ven: 7.36 (ref 7.25–7.43)
pO2, Ven: 36 mmHg (ref 32–45)

## 2023-08-24 LAB — TROPONIN I (HIGH SENSITIVITY)
Troponin I (High Sensitivity): 58 ng/L — ABNORMAL HIGH (ref <18)
Troponin I (High Sensitivity): 61 ng/L — ABNORMAL HIGH (ref <18)
Troponin I (High Sensitivity): 67 ng/L — ABNORMAL HIGH (ref <18)
Troponin I (High Sensitivity): 66 ng/L — ABNORMAL HIGH (ref <18)

## 2023-08-24 LAB — BASIC METABOLIC PANEL
Anion gap: 14 (ref 5–15)
Anion gap: 8 (ref 5–15)
BUN: 13 mg/dL (ref 8–23)
BUN: 14 mg/dL (ref 8–23)
CO2: 17 mmol/L — ABNORMAL LOW (ref 22–32)
CO2: 20 mmol/L — ABNORMAL LOW (ref 22–32)
Calcium: 8.4 mg/dL — ABNORMAL LOW (ref 8.9–10.3)
Calcium: 8.4 mg/dL — ABNORMAL LOW (ref 8.9–10.3)
Chloride: 112 mmol/L — ABNORMAL HIGH (ref 98–111)
Chloride: 115 mmol/L — ABNORMAL HIGH (ref 98–111)
Creatinine, Ser: 0.91 mg/dL (ref 0.44–1.00)
Creatinine, Ser: 0.96 mg/dL (ref 0.44–1.00)
GFR, Estimated: >60 mL/min (ref >60)
GFR, Estimated: 58 mL/min — ABNORMAL LOW (ref >60)
Glucose, Bld: 94 mg/dL (ref 70–99)
Glucose, Bld: 90 mg/dL (ref 70–99)
Potassium: 3.7 mmol/L (ref 3.5–5.1)
Potassium: 3.7 mmol/L (ref 3.5–5.1)
Sodium: 143 mmol/L (ref 135–145)
Sodium: 143 mmol/L (ref 135–145)

## 2023-08-24 LAB — ECHOCARDIOGRAM COMPLETE
AR max vel: 1.53 cm2
AV Peak grad: 10.8 mmHg
Ao pk vel: 1.64 m/s
Area-P 1/2: 2.65 cm2
Height: 65 in
S' Lateral: 2.2 cm
Weight: 2119.94 [oz_av]

## 2023-08-24 LAB — CBC
HCT: 32.1 % — ABNORMAL LOW (ref 36.0–46.0)
Hemoglobin: 11.1 g/dL — ABNORMAL LOW (ref 12.0–15.0)
MCH: 29.6 pg (ref 26.0–34.0)
MCHC: 34.6 g/dL (ref 30.0–36.0)
MCV: 85.6 fL (ref 80.0–100.0)
Platelets: 189 10*3/uL (ref 150–400)
RBC: 3.75 MIL/uL — ABNORMAL LOW (ref 3.87–5.11)
RDW: 13.8 % (ref 11.5–15.5)
WBC: 6.9 10*3/uL (ref 4.0–10.5)
nRBC: 0 % (ref 0.0–0.2)

## 2023-08-24 LAB — MAGNESIUM: Magnesium: 1.9 mg/dL (ref 1.7–2.4)

## 2023-08-24 MED ORDER — QUETIAPINE FUMARATE 25 MG PO TABS
50.0000 mg | ORAL_TABLET | Freq: Once | ORAL | Status: AC
  Administered 2023-08-24: 50 mg via ORAL
  Filled 2023-08-24: qty 2

## 2023-08-24 MED ORDER — TRAZODONE HCL 50 MG PO TABS: 50.0000 mg | ORAL_TABLET | Freq: Once | ORAL | Status: DC

## 2023-08-24 MED ORDER — HALOPERIDOL LACTATE 5 MG/ML IJ SOLN
1.0000 mg | Freq: Once | INTRAMUSCULAR | Status: AC
  Administered 2023-08-24: 1 mg via INTRAVENOUS
  Filled 2023-08-24: qty 1

## 2023-08-24 MED ORDER — LACTATED RINGERS IV SOLN: INTRAVENOUS | Status: DC

## 2023-08-24 NOTE — Progress Notes (Signed)
Patient's son, [name], called and notified of patient with 1:1 sitter and of current status. Patient's son to be here in the morning as well

## 2023-08-24 NOTE — Assessment & Plan Note (Signed)
Declining over past few years per family. Received haldol in ED d/t agitation/trying to get out of bed.  -need to have GOC conversation with family  -fall precautions -delirium precautions -need to confirm medications with family (memantine and trazodone listed in computer)

## 2023-08-24 NOTE — Progress Notes (Signed)
Patient is confused and wanting to get out of bed. She pulled PIV out. And removed cardiac monitoring device by herself.  Tried to redirect but still restless even after 1mg  IV Haldol. Made provider aware via secure chat. Order received for  PO Seroquel 50mg  .  Patient is still confused and got the order done for safety observation. Will continue to monitor.

## 2023-08-24 NOTE — Assessment & Plan Note (Deleted)
Declining over past few years per family. Received haldol in ED d/t agitation/trying to get out of bed.  -need to have GOC conversation with family  -fall precautions -delirium precautions -need to confirm medications with family (memantine and trazodone listed in computer)

## 2023-08-24 NOTE — TOC Initial Note (Signed)
Transition of Care [facility]) - Initial/Assessment Note    Patient Details  Name: Katelyn Sparks MRN: [id number] Date of Birth: [birth year]  Transition of Care [facility]) CM/SW Contact:    Katelyn Bridgeman, RN Phone Number: 08/24/2023, 2:04 PM  Clinical Narrative:                 Transition of Care [facility]) - Inpatient Brief Assessment   Patient Details  Name: Katelyn Sparks MRN: [id number] Date of Birth: Sep 28, 1935  Transition of Care [facility]) CM/SW Contact:    Katelyn Bridgeman, RN Phone Number: 08/24/2023, 2:04 PM   Clinical Narrative: CM attempted to meet with the patient in the room but patient has memory issues and I was unable to obtain history.  No family present in the room at this time.  I called and spoke with the patient'[s son, Katelyn Sparks by phone. The patient lives with family at the home - [address] [place], [place] [address].  Patient has no DME at the home.  Patient will discharge home with family when medically stable for discharge.  Senior assistance information provided to the son through [employer] that provides Tree [employer] Day Care programs.  [facility] information also provided for family to follow up.  Medicare Observation information provided to the patient's son by phone.  Medicare choice for home health company provided the son preferred [facility].  I called Katelyn Sparks, RNCM accepted for services.  The patient's family will provide transportation to home via car.   Transition of Care Asessment: Insurance and Status: Insurance coverage has been reviewed Patient has primary care physician: Yes Home environment has been reviewed: Home with Neice, Nephew Prior level of function:: Home with family assistance Prior/Current Home Services: No current home services Social Determinants of Health Reivew: SDOH reviewed interventions complete Readmission risk has been reviewed: Yes Transition of care needs: no transition  of care needs at this time         Patient Goals and CMS Choice            Expected Discharge Plan and Services                                              Prior Living Arrangements/Services                       Activities of Daily Living      Permission Sought/Granted                  Emotional Assessment              Admission diagnosis:  Syncope and collapse [R55] Dehydration [E86.0] Syncope [R55] Dementia with agitation, unspecified dementia severity, unspecified dementia type (HCC) [F03.911] Patient Active Problem List   Diagnosis Date Noted   Syncope and collapse 08/23/2023   Sundowning 12/10/2019   Agitation due to dementia (HCC) 12/10/2019   Rash of both hands 12/10/2019   Pre-diabetes 10/02/2019   Memory changes 10/02/2019   Anxiety 10/02/2019   Toenail fungus 10/02/2019   History of recent fall 04/04/2019   Insomnia due to medical condition 08/20/2014   Dementia (HCC) 08/20/2014   Chronic constipation 11/12/2013   Hyperglycemia 08/28/2013   Hyperlipidemia with target LDL less than 130 08/28/2013   Other screening mammogram 08/28/2013   Osteopenia  08/28/2013   Routine general medical examination at a health care facility 08/28/2013   GERD without esophagitis 07/26/2010   PCP:  Katelyn Andrew, NP Pharmacy:   [facility] [place], [place] - [address] [facility] [address] [place] [address] Phone: [phone] Fax: [phone]  [id number] [place], [place] - [address] AT [facility] [address] [place] [address] Phone: [phone] Fax: [phone]     Social Determinants of Health (SDOH) Social History: SDOH Screenings   Depression (PHQ2-9): Low Risk  (08/20/2021)  Tobacco Use: Low Risk  (08/23/2023)   SDOH Interventions:     Readmission Risk Interventions     No data to display

## 2023-08-24 NOTE — Evaluation (Signed)
Physical Therapy Evaluation Patient Details Name: Katelyn Sparks MRN: [id number] DOB: 1935/08/24 Today's Date: 08/24/2023  History of Present Illness  Patient is a 88 year old female presenting after syncopal episode while walking to the shower, second syncopal episode once EMS arrived. History of dementia.   Clinical Impression  Patient is agreeable to PT evaluation. She is only oriented to self and is able to follow most single step commands with increased time and repetition. She lives with her grandson and ambulates around the home without assistance and without assistive device typically. She has supervision due to baseline dementia and family have cameras installed in the home per son report.  Orthostatic vitals taken during session with no dizziness reported with mobility. Standing balance is fair. Patient ambulated in hallway with occasional drifting that is self corrected with cues with contact guard assistance provided for safety. Patient does better with all mobility tasks when distractions are limited.  The son is interested in home health services at discharge. Recommend PT follow up while in the hospital to maximize independence, safety, and decrease caregiver burden.   Orthostatic vitals taken during PT evaluation:  BP- Lying Pulse- Lying BP- Sitting Pulse- Sitting BP- Standing at 0 minutes Pulse- Standing at 0 minutes  08/24/23 0850 128/68 67 152/70 65 139/76 74          If plan is discharge home, recommend the following: A little help with walking and/or transfers;A little help with bathing/dressing/bathroom;Supervision due to cognitive status;Help with stairs or ramp for entrance;Assist for transportation;Assistance with cooking/housework;Direct supervision/assist for medications management   Can travel by private vehicle        Equipment Recommendations None recommended by PT  Recommendations for Other Services       Functional Status Assessment Patient has had a  recent decline in their functional status and demonstrates the ability to make significant improvements in function in a reasonable and predictable amount of time.     Precautions / Restrictions Precautions Precautions: Fall Restrictions Weight Bearing Restrictions: No      Mobility  Bed Mobility Overal bed mobility: Needs Assistance Bed Mobility: Supine to Sit, Sit to Supine     Supine to sit: Supervision Sit to supine: Min assist   General bed mobility comments: cues for task initiation. increased time for processing and follow through    Transfers Overall transfer level: Needs assistance Equipment used: 1 person hand held assist Transfers: Sit to/from Stand Sit to Stand: Contact guard assist           General transfer comment: 2 standing bouts performed with cues for task initiation    Ambulation/Gait Ambulation/Gait assistance: Contact guard assist Gait Distance (Feet): 100 Feet Assistive device: 1 person hand held assist Gait Pattern/deviations: Step-through pattern, Decreased stance time - left, Decreased stride length, Drifts right/left Gait velocity: decreased     General Gait Details: patient has occasional drifting to right and left. patient does better when distractions are limited. cues for attention to task. no reported dizziness with upright activity  Stairs            Wheelchair Mobility     Tilt Bed    Modified Rankin (Stroke Patients Only)       Balance Overall balance assessment: Needs assistance Sitting-balance support: Feet supported Sitting balance-Leahy Scale: Good     Standing balance support: No upper extremity supported Standing balance-Leahy Scale: Fair  Pertinent Vitals/Pain Pain Assessment Pain Assessment: No/denies pain    Home Living Family/patient expects to be discharged to:: Private residence Living Arrangements: Other relatives (grandson) Available Help at  Discharge: Family;Available PRN/intermittently Type of Home: House Home Access: Stairs to enter   [employer] of Steps:  ("a few steps")       Additional Comments: patient has supervision most of the time but is occasionally in the home alone. they have cameras set-up to monitor the patient    Prior Function Prior Level of Function : Needs assist             Mobility Comments: son reports patient is independent with ambulation around the home without assistive device. no falls, but patient has history of syncopal episodes in the past ADLs Comments: presume at least supervision required with ADLs, medication management, food preparation for safety given cognitive deficits at baseline     Extremity/Trunk Assessment   Upper Extremity Assessment Upper Extremity Assessment: Defer to OT evaluation    Lower Extremity Assessment Lower Extremity Assessment: Generalized weakness       Communication   Communication Communication: No apparent difficulties  Cognition Arousal: Alert Behavior During Therapy: Restless Overall Cognitive Status: History of cognitive impairments - at baseline                                 General Comments: patient has history of dementia. she is oriented to person only. she is able to follow single step commands most of the time with increased time and repetition. patient is restless at times and needs cues for attention to task        General Comments General comments (skin integrity, edema, etc.): orthostatic vitals taken during session (see flowsheet for details). no dizziness reported with mobility    Exercises     Assessment/Plan    PT Assessment Patient needs continued PT services  PT Problem List Decreased balance;Decreased activity tolerance;Decreased mobility;Decreased strength;Decreased cognition;Decreased safety awareness       PT Treatment Interventions DME instruction;Gait training;Stair  training;Functional mobility training;Therapeutic activities;Therapeutic exercise;Neuromuscular re-education;Balance training;Cognitive remediation;Patient/family education    PT Goals (Current goals can be found in the Care Plan section)  Acute Rehab PT Goals Patient Stated Goal: family goal is to have home health services at home PT Goal Formulation: With family Time For Goal Achievement: 09/07/23 Potential to Achieve Goals: Fair    Frequency Min 1X/week     Co-evaluation               AM-PAC PT "6 Clicks" Mobility  Outcome Measure Help needed turning from your back to your side while in a flat bed without using bedrails?: None Help needed moving from lying on your back to sitting on the side of a flat bed without using bedrails?: A Little Help needed moving to and from a bed to a chair (including a wheelchair)?: A Little Help needed standing up from a chair using your arms (e.g., wheelchair or bedside chair)?: A Little Help needed to walk in hospital room?: A Little Help needed climbing 3-5 steps with a railing? : A Little 6 Click Score: 19    End of Session Equipment Utilized During Treatment: Gait belt Activity Tolerance: Patient tolerated treatment well Patient left: in bed;with call bell/phone within reach;with bed alarm set;with family/visitor present (son Cedric present throughout session) Nurse Communication: Mobility status PT Visit Diagnosis: Muscle weakness (generalized) (M62.81);Unsteadiness on feet (R26.81)  Time: 4098-1191 PT Time Calculation (min) (ACUTE ONLY): 21 min   Charges:   PT Evaluation $PT Eval Low Complexity: 1 Low   PT General Charges $$ ACUTE PT VISIT: 1 Visit         Donna Bernard, PT, MPT   Ina Homes 08/24/2023, 9:25 AM

## 2023-08-24 NOTE — Progress Notes (Addendum)
Daily Progress Note Intern Pager: [phone]  Patient name: Katelyn Sparks Medical record number: [id number] Date of birth: 11-09-1935 Age: 88 y.o. Gender: female  Primary Care Provider: Ivonne Andrew, NP Consultants: None Code Status: Full  Pt Overview and Major Events to Date:  9/11: Admitted   Assessment and Plan: Katelyn Sparks is an 88 y.o female who presented to ED last night post-syncopal episode. Fall was witnessed by grandson, who denied head trauma as patient was sitting on toilet during the episode and did not fall. Differential includes syncope secondary to dehydration/ hypovolemia, orthostatic hypotension, arrthymia (EKG largely unremarkable making this less likely), vasovagal syncope (patient family reports her being upset prior to episode), or multifactorial cause. CBC and CMP stable at this time. She has past medical history consists of dementia and osteoarthrosis. Patient is currently stable. Assessment & Plan Syncope and collapse Most likely causes include dehydration/ hypovolemia and/or orthostatic hypotension. EKG nonconcerning for MI, however did show evidence of prolonged Qtc (501). Troponin slightly elevated on admission but flattened (58 --> 67 --> 66), possibly secondary to hypovolemia.  Patient switched from NS IVF to [facility] IVF due to increased Cl levels. VBG revealed pH 7.36 and pCO2 40. Orthostatic vitals were unremarkable. Considering bicarb if acidosis develops, but patient currently stable. Head cT and echo ordered to workup other causes of syncope.  -Continue LR IVF -Continue cardiac monitoring to monitor for potential arrhythmias -f/u echocardiogram -Ordered head CT to evaluate for neurologic causes -Fall precautions in place -PT/OT consulted -Discontinue trending troponin Dementia (HCC) Mental decline over past few years per family. Received haldol in ED last night (9/11) due to agitation / trying to get out of bed.  -Delirium precautions and fall  precautions in place -Haldol PRN for agitation/ delirium   Chronic Stable conditions: GERD  FEN/GI: Regular diet PPx: Lovenox Dispo:Pending clinical improvement   Subjective:  Patient is pleasantly demented at baseline, appears to have returned to her baseline. Son Web designer) at bedside assisting with questions. Son reports that his mother has had several similar episodes of fainting in the past, most recently one month ago. The episodes "usually" occur when the patient is on the toilet. She continues to breathe normally through out fainting episodes, so he reports that he "does not feel THAT concerned." However, on most recent episode, the grandson noted a few "gasping" breaths, which led to family concern and patient was subsequently brought to ED. Patient's grandson denied any head injury and reported that patient was sitting on the toilet getting ready to shower, and did not fall off the toilet during the fainting episode. The patient reports regular bowel movements, however she is possibly poor historian secondary to dementia. The son stated that her skin looked more dry than her normal baseline, possibly suggesting dehydration.    Objective: Temp:  [97 F (36.1 C)-98.1 F (36.7 C)] 98.1 F (36.7 C) (09/12 0215) Pulse Rate:  [59-89] 75 (09/12 0608) Resp:  [14-23] 17 (09/12 0608) BP: (118-136)/(58-79) 134/75 (09/12 0608) SpO2:  [95 %-100 %] 99 % (09/12 0608) Weight:  [60.1 kg] 60.1 kg (09/11 1420)  Physical Exam: General: Alert, resting comfortably. Pleasantly demented  Neuro: Alert and oriented to self, but not to time or place.  Cardiovascular: Regular rate and rhythm. Skin warm and very dry Respiratory: Lungs clear to auscultation bilaterally. Normal WOB on room air Abdomen: Soft, non-tender Extremities: Spontaneous movement of all 4 extremities. No pitting edema  Laboratory: Most recent CBC Lab Results  Component Value Date   WBC 6.9 08/24/2023   HGB 11.1 (L) 08/24/2023    HCT 32.1 (L) 08/24/2023   MCV 85.6 08/24/2023   PLT 189 08/24/2023   Most recent BMP    Latest Ref Rng & Units 08/24/2023    4:42 AM  BMP  Glucose 70 - 99 mg/dL 90   BUN 8 - 23 mg/dL 14   Creatinine 4.09 - 1.00 mg/dL 8.11   Sodium 914 - 782 mmol/L 143   Potassium 3.5 - 5.1 mmol/L 3.7   Chloride 98 - 111 mmol/L 115   CO2 22 - 32 mmol/L 20   Calcium 8.9 - 10.3 mg/dL 8.4   Other pertinent labs: None  Imaging/Diagnostic Tests: None, awaiting head CT  Dayle Points, Medical Student 08/24/2023, 7:11 AM  Medical student, [facility] FPTS Intern pager: [phone], text pages welcome Secure chat group [facility] Teaching Service

## 2023-08-24 NOTE — H&P (Signed)
Hospital Admission History and Physical Service Pager: [phone]   Patient name: Katelyn Sparks         Medical record number: [id number] Date of Birth: 24-Nov-1935          Age: 88 y.o.    Gender: female   Primary Care Provider: Ivonne Andrew, NP Consultants: None Code Status: Full code - discussed with son Katelyn Sparks Preferred Emergency Contact:  Contact Information       Name Relation Home Work Mobile    Amescua,Katelyn Other [phone]        [place], Cassiano [phone]   [phone]         Other Contacts   None on File          Chief Complaint: Syncope   Assessment and Plan: Katelyn Sparks is a 88 y.o. female presenting after witnessed syncopal episode. Differential for this patient's presentation of this includes syncope due to dehydration (likely has poor p.o. intake in setting of dementia), orthostatic hypotension, arrhythmia (no arrhythmia noted on EKG), or vasovagal (possible as it was reported patient was upset about taking a shower).  EKG without concern for MI though trops are mildly elevated. Also considered electrolyte abnormalities, anemia, and hypoglycemia as contributing factors though less likely as labs were stable.  Will also consider seizure (no history of seizures) and intracranial abnormality (neuro exam reassuring, did not hit her head per report).  Assessment & Plan Syncope and collapse Most likely causes include dehydration and orthostatic hypotension.  EKG nonconcerning for MI but Troponin is elevated 31>39, could be d/t hypovolemia.  Vitals and physical exam are reassuring. -Admit to FMTS, attending Dr. Marina Goodell -S/p 1 L fluid bolus x 2,  mIVF overnight -Orthostatic vitals - trend trops -Continue cardiac monitoring to monitor for arrhythmias -Consider echocardiogram  -AM CBC and BMP -fall precautions  -PT/OT -CTM Dementia (HCC) Declining over past few years per family. Received haldol in ED d/t agitation/trying to get out of bed.  -need to  have GOC conversation with family  -fall precautions -delirium precautions -need to confirm medications with family (memantine and trazodone listed in computer)   Chronic and stable conditions:  GERD Osteoarthrosis    FEN/GI: Regular diet VTE Prophylaxis: Lovenox   Disposition: med tele  History of Present Illness:  Katelyn Sparks is a 88 y.o. female presenting with syncope  Pt is unable to provide history d/t dementia. Denies being in any pain but is cold.  I spoke on the phone with pt's son, Katelyn Sparks (lives out of town), who reports she had syncopal episode wittnessed by her grandson. Katelyn Sparks was told family members were trying to get her into the shower, she didn't want to and she passed out.  He is unsure how long she was out, if she completely lost consciousness.  He does states she did not hit her head per family's report to him.  Katelyn Sparks was told by family members that similar events have happened before.  States it would be better to talk with his brother, Katelyn who lives in [place] for more detailed information. I tried calling him but he did not pick up.  Per ED note, it was reported that patient felt lightheaded prior to falling. Katelyn Sparks has noticed both mental and physical decline in his mother over past few years.  Katelyn Sparks wishes her to be full code for now until he and his brother can further discuss.    In the ED, vitals stable, BMP w/ Cr  1.12 (base line ~0.9), GFR 48, CBC w/ diff WNL w/ exception of ANC 9.2., trop 31>39, EKG without ST elevation or arrhythmia. She was treated with 1 L NS bolus x2 and haldol 2 mg IV for agitation (continuously trying to get out of the bed)   Review Of Systems: Per HPI   Pertinent Past Medical History: Dementia GERD Osteoarthrosis Remainder reviewed in history tab.   Pertinent Past Surgical History: None pertinent   Remainder reviewed in history tab.   Pertinent Social History: Tobacco use: need to talk to family Alcohol use: need to  talk to family Other Substance use: Need to talk to family Lives with son Katelyn Sparks  Pertinent Family History: Hypertension, diabetes Remainder reviewed in history tab.    Important Outpatient Medications: Unclear, need to discuss with family Remainder reviewed in medication history.    Objective: BP 120/60 (BP Location: Right Arm)   Pulse 63   Temp (!) 97.5 F (36.4 C) (Oral)   Resp 19   Ht 5\' 5"  (1.651 m)   Wt 60.1 kg   SpO2 95%   BMI 22.05 kg/m  Exam: General: 88 year old female, lying in bed, shivering, NAD Eyes: White sclera, clear conjunctiva ENTM: Dry MM Neck: Supple Cardiovascular: RRR, no S1/S2 Respiratory: CTAB, normal effort Gastrointestinal: Bowel sounds present, soft, nontender palpation, nondistended MSK: 5/5 strength of BUEs and BLEs Derm: Warm and dry Neuro: Alert, oriented to person only. cranial nerves II through XII intact, sensation intact, finger-nose-finger test normal Psych: Pleasant and appropriate   Labs:  CBC BMET  Last Labs     Recent Labs  Lab 08/23/23 1535  WBC 10.2  HGB 13.1  HCT 38.1  PLT 212     Last Labs     Recent Labs  Lab 08/23/23 1535  NA 143  K 4.2  CL 106  CO2 20*  BUN 20  CREATININE 1.12*  GLUCOSE 119*  CALCIUM 9.6        EKG: My own interpretation (not copied from electronic read)  Regular rate, NSR, no ST elevation , Qtc prolonged at 511 on EKG but 487 when I calculate it     Imaging Studies Performed: None     Erick Alley, DO 08/23/2023, 8:49 PM PGY-3, [facility]   FPTS Intern pager: [phone], text pages welcome Secure chat group [facility] Teaching Service

## 2023-08-24 NOTE — Evaluation (Signed)
Occupational Therapy Evaluation Patient Details Name: Katelyn Sparks MRN: [id number] DOB: 1935-11-02 Today's Date: 08/24/2023   History of Present Illness Patient is a 88 year old female presenting after syncopal episode while walking to the shower, second syncopal episode once EMS arrived. History of dementia.   Clinical Impression   Pt admitted for above, presents with impaired cognition but has a hx of cognitive impairments at baseline. Pt requires consistent redirection and cueing to properly engage in ADLs, ambulating in room with min A HHA. She perseverates on tasks without the redirection, even then it requires several prompts to refocus the attention. Pt would benefit from continued acute skilled OT services to address above deficits and help transition to next level of care. Patient would benefit from post acute Home OT services to help maximize functional independence in natural environment        If plan is discharge home, recommend the following: A little help with walking and/or transfers;A little help with bathing/dressing/bathroom;Supervision due to cognitive status;Direct supervision/assist for financial management;Help with stairs or ramp for entrance;Direct supervision/assist for medications management;Assist for transportation;Assistance with cooking/housework    Functional Status Assessment  Patient has had a recent decline in their functional status and demonstrates the ability to make significant improvements in function in a reasonable and predictable amount of time.  Equipment Recommendations  None recommended by OT    Recommendations for Other Services       Precautions / Restrictions Precautions Precautions: Fall Restrictions Weight Bearing Restrictions: No      Mobility Bed Mobility Overal bed mobility: Needs Assistance Bed Mobility: Supine to Sit     Supine to sit: Supervision     General bed mobility comments: cues for initiation, pt left sitting in  recliner at end of session    Transfers Overall transfer level: Needs assistance Equipment used: 1 person hand held assist Transfers: Sit to/from Stand Sit to Stand: Contact guard assist           General transfer comment: STS from toilet CGA      Balance Overall balance assessment: Needs assistance Sitting-balance support: Feet supported Sitting balance-Leahy Scale: Good     Standing balance support: No upper extremity supported Standing balance-Leahy Scale: Fair                             ADL either performed or assessed with clinical judgement   ADL Overall ADL's : Needs assistance/impaired Eating/Feeding: Set up;Bed level   Grooming: Wash/dry hands;Standing;Contact guard assist;Cueing for sequencing Grooming Details (indicate cue type and reason): Max cues for redirection to task, pt would stop task to go back to the bathroom stating that she needed to pull up her briefs although they were already on, she just so happened to be pulling them down in the restroom Upper Body Bathing: Minimal assistance;Sitting   Lower Body Bathing: Sitting/lateral leans;Contact guard assist   Upper Body Dressing : Sitting;Minimal assistance;Cueing for sequencing   Lower Body Dressing: Sitting/lateral leans;Minimal assistance   Toilet Transfer: Minimal assistance;Ambulation Toilet Transfer Details (indicate cue type and reason): Pt continously places toilet seat up when therapist places it down, then pt proceeded to sit without the toilet seat down Toileting- Clothing Manipulation and Hygiene: Sitting/lateral lean;Minimal assistance Toileting - Clothing Manipulation Details (indicate cue type and reason): min A for thoroughness     Functional mobility during ADLs: Minimal assistance       Vision  Perception         Praxis         Pertinent Vitals/Pain Pain Assessment Pain Assessment: Faces Faces Pain Scale: No hurt     Extremity/Trunk Assessment  Upper Extremity Assessment Upper Extremity Assessment: Generalized weakness   Lower Extremity Assessment Lower Extremity Assessment: Generalized weakness       Communication Communication Communication: No apparent difficulties   Cognition Arousal: Alert Behavior During Therapy: Restless, Impulsive Overall Cognitive Status: History of cognitive impairments - at baseline                                 General Comments: patient has history of dementia. she is oriented to person only. she is able to follow single step commands most of the time with increased time and repetition. patient is restless at times and needs cues for attention to task. Perseverated on certain tasks such as toileting or washing hands     General Comments  VSS, son Cedric present during session    Exercises     Shoulder Instructions      Home Living Family/patient expects to be discharged to:: Private residence Living Arrangements: Other relatives (grandson) Available Help at Discharge: Family;Available PRN/intermittently Type of Home: House Home Access: Stairs to enter [employer] of Steps:  ("a few steps")   Home Layout: One level     Bathroom Shower/Tub: Tub/shower unit             Additional Comments: patient has supervision most of the time but is occasionally in the home alone. they have cameras set-up to monitor the patient. Family considering a PCA to provide more assistance      Prior Functioning/Environment Prior Level of Function : Needs assist             Mobility Comments: son reports patient is independent with ambulation around the home without assistive device. no falls, but patient has history of syncopal episodes in the past ADLs Comments: at least supervision required with ADLs, medication management, food preparation for safety given cognitive deficits at baseline.        OT Problem List: Decreased strength;Impaired balance (sitting and/or  standing);Other (comment) (sycnope)      OT Treatment/Interventions: Self-care/ADL training;Balance training;Therapeutic exercise;Therapeutic activities;Patient/family education    OT Goals(Current goals can be found in the care plan section) Acute Rehab OT Goals Patient Stated Goal: To go back home OT Goal Formulation: With family Time For Goal Achievement: 09/07/23 Potential to Achieve Goals: Good ADL Goals Pt Will Perform Grooming: sitting;with set-up Pt Will Perform Lower Body Bathing: with contact guard assist;sitting/lateral leans Pt Will Perform Upper Body Dressing: with contact guard assist;sitting Pt Will Perform Lower Body Dressing: sit to/from stand;with min assist Pt Will Transfer to Toilet: with contact guard assist;ambulating  OT Frequency: Min 1X/week    Co-evaluation              AM-PAC OT "6 Clicks" Daily Activity     Outcome Measure Help from another person eating meals?: A Little Help from another person taking care of personal grooming?: A Little Help from another person toileting, which includes using toliet, bedpan, or urinal?: A Little Help from another person bathing (including washing, rinsing, drying)?: A Little Help from another person to put on and taking off regular upper body clothing?: A Little Help from another person to put on and taking off regular lower body clothing?: A Little 6 Click  Score: 18   End of Session Equipment Utilized During Treatment: Gait belt Nurse Communication: Mobility status  Activity Tolerance: Patient tolerated treatment well Patient left: in chair;with call bell/phone within reach;with chair alarm set;with family/visitor present  OT Visit Diagnosis: Unsteadiness on feet (R26.81);Other (comment);Muscle weakness (generalized) (M62.81) (syncope)                Time: 4098-1191 OT Time Calculation (min): 46 min Charges:  OT General Charges $OT Visit: 1 Visit OT Evaluation $OT Eval Moderate Complexity: 1 Mod OT  Treatments $Self Care/Home Management : 8-22 mins $Therapeutic Activity: 8-22 mins  08/24/2023  AB, OTR/L  Acute Rehabilitation Services  Office: [phone]   Tristan Schroeder 08/24/2023, 11:54 AM

## 2023-08-24 NOTE — Assessment & Plan Note (Deleted)
Most likely causes include dehydration and orthostatic hypotension.  EKG nonconcerning for MI but Troponin is elevated 31>39, could be d/t hypovolemia.  Vitals and physical exam are reassuring. -Admit to FMTS, attending Dr. Marina Goodell -S/p 1 L fluid bolus x 2,  mIVF overnight -Orthostatic vitals - trend trops -Continue cardiac monitoring to monitor for arrhythmias -Consider echocardiogram  -AM CBC and BMP -fall precautions  -PT/OT -CTM

## 2023-08-24 NOTE — ED Notes (Addendum)
ED TO INPATIENT HANDOFF REPORT  ED Nurse Name and Phone #: [phone]  S Name/Age/Gender Katelyn Sparks 88 y.o. female Room/Bed: 006C/006C  Code Status   Code Status: Full Code  Home/SNF/Other Home Patient oriented to: self Is this baseline? Yes   Triage Complete: Triage complete  Chief Complaint Syncope [R55]  Triage Note Pt arrived via [facility] from home. Per EMS, pt's grandson was walking with pt to get to shower and pt had a syncopal episode. Pt had a second syncopal episode once EMS arrived in front of them. Per EMS, initial bp was 86 systolic. EMS gave NS .   Allergies No Known Allergies  Level of Care/Admitting Diagnosis ED Disposition     ED Disposition  Admit   Condition  --   Comment  Hospital Area: [facility] [[id number]]  Level of Care: Telemetry Medical [104]  May place patient in observation at [facility] or [facility] if equivalent level of care is available:: No  Covid Evaluation: Asymptomatic - no recent exposure (last 10 days) testing not required  Diagnosis: Syncope [206001]  Admitting Physician: Erick Alley [[id number]]  Attending Physician: Billey Co [[id number]]          B Medical/Surgery History Past Medical History:  Diagnosis Date   Blood in stool    Closed dislocation of left shoulder 03/2019   Dementia (HCC)    Esophageal reflux    Flatulence, eructation, and gas pain    Osteoarthrosis, unspecified whether generalized or localized, unspecified site    Unspecified hemorrhoids without mention of complication    Past Surgical History:  Procedure Laterality Date   ABDOMINAL HYSTERECTOMY     COLONOSCOPY  11/2010   UPPER GI ENDOSCOPY  11/2010     A IV Location/Drains/Wounds Patient Lines/Drains/Airways Status     Active Line/Drains/Airways     Name Placement date Placement time Site Days   Peripheral IV 08/23/23 22 G Posterior;Left Forearm 08/23/23  --  Forearm  1            Intake/Output Last 24  hours  Intake/Output Summary (Last 24 hours) at 08/24/2023 0452 Last data filed at 08/24/2023 0423 Gross per 24 hour  Intake 1369.67 ml  Output --  Net 1369.67 ml    Labs/Imaging Results for orders placed or performed during the hospital encounter of 08/23/23 (from the past 48 hour(s))  Basic metabolic panel     Status: Abnormal   Collection Time: 08/23/23  3:35 PM  Result Value Ref Range   Sodium 143 135 - 145 mmol/L   Potassium 4.2 3.5 - 5.1 mmol/L   Chloride 106 98 - 111 mmol/L   CO2 20 (L) 22 - 32 mmol/L   Glucose, Bld 119 (H) 70 - 99 mg/dL    Comment: Glucose reference range applies only to samples taken after fasting for at least 8 hours.   BUN 20 8 - 23 mg/dL   Creatinine, Ser 9.56 (H) 0.44 - 1.00 mg/dL   Calcium 9.6 8.9 - 21.3 mg/dL   GFR, Estimated 48 (L) >60 mL/min    Comment: (NOTE) Calculated using the CKD-EPI Creatinine Equation (2021)    Anion gap 17 (H) 5 - 15    Comment: Performed at [facility], [address] [place], [place] [address]  CBC WITH DIFFERENTIAL     Status: Abnormal   Collection Time: 08/23/23  3:35 PM  Result Value Ref Range   WBC 10.2 4.0 - 10.5 K/uL   RBC  4.50 3.87 - 5.11 MIL/uL   Hemoglobin 13.1 12.0 - 15.0 g/dL   HCT 16.1 09.6 - 04.5 %   MCV 84.7 80.0 - 100.0 fL   MCH 29.1 26.0 - 34.0 pg   MCHC 34.4 30.0 - 36.0 g/dL   RDW 40.9 81.1 - 91.4 %   Platelets 212 150 - 400 K/uL   nRBC 0.0 0.0 - 0.2 %   Neutrophils Relative % 90 %   Neutro Abs 9.2 (H) 1.7 - 7.7 K/uL   Lymphocytes Relative 7 %   Lymphs Abs 0.7 0.7 - 4.0 K/uL   Monocytes Relative 3 %   Monocytes Absolute 0.3 0.1 - 1.0 K/uL   Eosinophils Relative 0 %   Eosinophils Absolute 0.0 0.0 - 0.5 K/uL   Basophils Relative 0 %   Basophils Absolute 0.0 0.0 - 0.1 K/uL   Immature Granulocytes 0 %   Abs Immature Granulocytes 0.04 0.00 - 0.07 K/uL    Comment: Performed at [facility], [address] [place], [place] [address]  Troponin I (High Sensitivity)     Status:  Abnormal   Collection Time: 08/23/23  4:42 PM  Result Value Ref Range   Troponin I (High Sensitivity) 31 (H) <18 ng/L    Comment: (NOTE) Elevated high sensitivity troponin I (hsTnI) values and significant  changes across serial measurements may suggest ACS but many other  chronic and acute conditions are known to elevate hsTnI results.  Refer to the "Links" section for chest pain algorithms and additional  guidance. Performed at [facility], [address] [place], [place] [address]   Troponin I (High Sensitivity)     Status: Abnormal   Collection Time: 08/23/23  7:40 PM  Result Value Ref Range   Troponin I (High Sensitivity) 39 (H) <18 ng/L    Comment: (NOTE) Elevated high sensitivity troponin I (hsTnI) values and significant  changes across serial measurements may suggest ACS but many other  chronic and acute conditions are known to elevate hsTnI results.  Refer to the "Links" section for chest pain algorithms and additional  guidance. Performed at [facility], [address], [place], [place] [address]   Basic metabolic panel     Status: Abnormal   Collection Time: 08/24/23 12:36 AM  Result Value Ref Range   Sodium 143 135 - 145 mmol/L   Potassium 3.7 3.5 - 5.1 mmol/L   Chloride 112 (H) 98 - 111 mmol/L   CO2 17 (L) 22 - 32 mmol/L   Glucose, Bld 94 70 - 99 mg/dL    Comment: Glucose reference range applies only to samples taken after fasting for at least 8 hours.   BUN 13 8 - 23 mg/dL   Creatinine, Ser 4.69 0.44 - 1.00 mg/dL   Calcium 8.4 (L) 8.9 - 10.3 mg/dL   GFR, Estimated >62 >95 mL/min    Comment: (NOTE) Calculated using the CKD-EPI Creatinine Equation (2021)    Anion gap 14 5 - 15    Comment: Performed at [facility], [address], [place], [place] [address]  CBC     Status: Abnormal   Collection Time: 08/24/23 12:36 AM  Result Value Ref Range   WBC 6.9 4.0 - 10.5 K/uL   RBC 3.75 (L) 3.87 - 5.11 MIL/uL   Hemoglobin 11.1 (L) 12.0 - 15.0  g/dL   HCT 24.4 (L) 01.0 - 27.2 %   MCV 85.6 80.0 - 100.0 fL   MCH 29.6 26.0 -  34.0 pg   MCHC 34.6 30.0 - 36.0 g/dL   RDW 16.1 09.6 - 04.5 %   Platelets 189 150 - 400 K/uL   nRBC 0.0 0.0 - 0.2 %    Comment: Performed at [facility], [address] [place], [place] [address]  Troponin I (High Sensitivity)     Status: Abnormal   Collection Time: 08/24/23 12:36 AM  Result Value Ref Range   Troponin I (High Sensitivity) 58 (H) <18 ng/L    Comment: DELTA CHECK NOTED RESULT CALLED TO, READ BACK BY AND VERIFIED WITH: N.Quantel Mcinturff RN [phone] 08/24/2023 BY G.GANADEN (NOTE) Elevated high sensitivity troponin I (hsTnI) values and significant  changes across serial measurements may suggest ACS but many other  chronic and acute conditions are known to elevate hsTnI results.  Refer to the Links section for chest pain algorithms and additional  guidance. Performed at [facility], [address] [place], [place] [address]   Troponin I (High Sensitivity)     Status: Abnormal   Collection Time: 08/24/23  3:02 AM  Result Value Ref Range   Troponin I (High Sensitivity) 61 (H) <18 ng/L    Comment: (NOTE) Elevated high sensitivity troponin I (hsTnI) values and significant  changes across serial measurements may suggest ACS but many other  chronic and acute conditions are known to elevate hsTnI results.  Refer to the "Links" section for chest pain algorithms and additional  guidance. Performed at [facility], [address], [place], [place] [address]    No results found.  Pending Labs [employer] (From admission, onward)     Start     Ordered   08/24/23 0426  Basic metabolic panel  ONCE - STAT,   STAT        08/24/23 0425            Vitals/Pain Today's Vitals   08/24/23 0115 08/24/23 0130 08/24/23 0200 08/24/23 0215  BP: 124/72 136/67 136/75 121/68  Pulse: 81 72 72 73  Resp:    15  Temp:    98.1 F (36.7 C)  TempSrc:      SpO2: 99% 100% 100% 100%   Weight:      Height:        Isolation Precautions No active isolations  Medications Medications  enoxaparin (LOVENOX) injection 40 mg (has no administration in time range)  sodium chloride 0.9 % bolus 1,000 mL (0 mLs Intravenous Stopped 08/23/23 2040)  sodium chloride 0.9 % bolus 1,000 mL (0 mLs Intravenous Stopped 08/23/23 2259)  haloperidol lactate (HALDOL) injection 2 mg (2 mg Intravenous Given 08/23/23 2135)    Mobility Walks at home, has not ambulated in ED   Focused Assessments Increased trop , Syncopal episodes    R Recommendations: See Admitting Provider Note  Report given to:   Additional Notes:  Patient is restless/more confused at times, easy to redirect

## 2023-08-24 NOTE — Care Management Obs Status (Cosign Needed)
MEDICARE OBSERVATION STATUS NOTIFICATION   Patient Details  Name: Katelyn Sparks MRN: [id number] Date of Birth: Jun 04, 1935   Medicare Observation Status Notification Given:  Yes    Janae Bridgeman, RN 08/24/2023, 1:39 PM

## 2023-08-24 NOTE — Assessment & Plan Note (Addendum)
Most likely causes include dehydration/ hypovolemia and/or orthostatic hypotension. EKG nonconcerning for MI, however did show evidence of prolonged Qtc (501). Troponin slightly elevated on admission but flattened (58 --> 67 --> 66), possibly secondary to hypovolemia.  Patient switched from NS IVF to [facility] IVF due to increased Cl levels. VBG revealed pH 7.36 and pCO2 40. Orthostatic vitals were unremarkable. Considering bicarb if acidosis develops, but patient currently stable. Head cT and echo ordered to workup other causes of syncope.  -Continue LR IVF -Continue cardiac monitoring to monitor for potential arrhythmias -f/u echocardiogram -Ordered head CT to evaluate for neurologic causes -Fall precautions in place -PT/OT consulted -Discontinue trending troponin

## 2023-08-24 NOTE — Hospital Course (Addendum)
Katelyn Sparks is a 88 y.o.female with a history of dementia, osteoarthrosis, and GERD who was admitted to the [facility] Medicine Teaching Service at [facility] post-syncopal episode. Her hospital course is detailed below:  Syncope: Family reported patient experienced a syncopal episode of 9/11 while getting ready to take a shower. She was sitting on the toilet during the episode, and did not experience any fall or trauma as reported by grandson. Son was at bedside and assisted with questions due to patient dementia. Patient vitals remained stable throughout admission. CBC, VBG, and CMP largely unremarkable. Continued to trend troponins until flattening on 9/12, likely elevated secondary to hypovolemia. Orthostatic vitals revealed no evidence of orthostatic hypotension. Head CT w/o contrast revealed no intracranial pathology. Echocardiogram revealed findings consistent with hypertrophic obstructive cardiomyopathy and grade I diastolic dysfunction. Cardiology was consulted and ***  Other chronic conditions were medically managed with home medications and formulary alternatives as necessary:  PCP Follow-up Recommendations:  Cardiology follow-up

## 2023-08-24 NOTE — Progress Notes (Signed)
Notified by nurse that pt was becoming agitated and going into other people's room. Went to assess pt. She was confused and wanting to get out of bed, but not aggressive or violent. Tried to redirect pt, but still restless and trying to get out of bed. Will give 1mg  IV Haldol.

## 2023-08-24 NOTE — Assessment & Plan Note (Signed)
Most likely causes include dehydration and orthostatic hypotension.  EKG nonconcerning for MI but Troponin is elevated 31>39, could be d/t hypovolemia.  Vitals and physical exam are reassuring. -Admit to FMTS, attending Dr. Marina Goodell -S/p 1 L fluid bolus x 2,  mIVF overnight -Orthostatic vitals - trend trops -Continue cardiac monitoring to monitor for arrhythmias -Consider echocardiogram  -AM CBC and BMP -fall precautions  -PT/OT -CTM

## 2023-08-24 NOTE — Assessment & Plan Note (Addendum)
Mental decline over past few years per family. Received haldol in ED last night (9/11) due to agitation / trying to get out of bed.  -Delirium precautions and fall precautions in place -Haldol PRN for agitation/ delirium

## 2023-08-24 NOTE — Progress Notes (Signed)
Patient transferred to CT procedure at 1205PM via bed and back on the floor at 1235PM via bed. Patient is alert to self.

## 2023-08-25 ENCOUNTER — Other Ambulatory Visit: Payer: Self-pay

## 2023-08-25 DIAGNOSIS — R55 Syncope and collapse: Secondary | ICD-10-CM | POA: Diagnosis not present

## 2023-08-25 DIAGNOSIS — I421 Obstructive hypertrophic cardiomyopathy: Secondary | ICD-10-CM | POA: Diagnosis not present

## 2023-08-25 LAB — BASIC METABOLIC PANEL
Anion gap: 8 (ref 5–15)
BUN: 10 mg/dL (ref 8–23)
CO2: 22 mmol/L (ref 22–32)
Calcium: 8.5 mg/dL — ABNORMAL LOW (ref 8.9–10.3)
Chloride: 110 mmol/L (ref 98–111)
Creatinine, Ser: 0.77 mg/dL (ref 0.44–1.00)
GFR, Estimated: >60 mL/min (ref >60)
Glucose, Bld: 96 mg/dL (ref 70–99)
Potassium: 3.4 mmol/L — ABNORMAL LOW (ref 3.5–5.1)
Sodium: 140 mmol/L (ref 135–145)

## 2023-08-25 LAB — CBC
HCT: 30.8 % — ABNORMAL LOW (ref 36.0–46.0)
Hemoglobin: 11.1 g/dL — ABNORMAL LOW (ref 12.0–15.0)
MCH: 30.4 pg (ref 26.0–34.0)
MCHC: 36 g/dL (ref 30.0–36.0)
MCV: 84.4 fL (ref 80.0–100.0)
Platelets: 152 10*3/uL (ref 150–400)
RBC: 3.65 MIL/uL — ABNORMAL LOW (ref 3.87–5.11)
RDW: 13.9 % (ref 11.5–15.5)
WBC: 4.8 10*3/uL (ref 4.0–10.5)
nRBC: 0 % (ref 0.0–0.2)

## 2023-08-25 MED ORDER — TRAZODONE HCL 50 MG PO TABS
50.0000 mg | ORAL_TABLET | Freq: Once | ORAL | Status: AC
  Administered 2023-08-25: 50 mg via ORAL
  Filled 2023-08-25: qty 1

## 2023-08-25 MED ORDER — METOPROLOL TARTRATE 12.5 MG HALF TABLET
12.5000 mg | ORAL_TABLET | Freq: Two times a day (BID) | ORAL | Status: DC
  Administered 2023-08-25 – 2023-08-28 (×6): 12.5 mg via ORAL
  Filled 2023-08-25 (×6): qty 1

## 2023-08-25 MED ORDER — POTASSIUM CHLORIDE CRYS ER 20 MEQ PO TBCR
40.0000 meq | EXTENDED_RELEASE_TABLET | Freq: Once | ORAL | Status: AC
  Administered 2023-08-25: 40 meq via ORAL
  Filled 2023-08-25: qty 2

## 2023-08-25 MED ORDER — METOPROLOL TARTRATE 25 MG PO TABS: 25.0000 mg | ORAL_TABLET | Freq: Two times a day (BID) | ORAL | Status: DC

## 2023-08-25 NOTE — Consult Note (Signed)
0.77  CALCIUM 8.4* 8.4*  --  8.5*  MG  --   --  1.9  --   GFRNONAA >60 58*  --  >60  ANIONGAP 14 8  --  8    No results for input(s): "PROT", "ALBUMIN", "AST", "ALT", "ALKPHOS", "BILITOT" in the last 168 hours. Lipids No results for input(s): "CHOL", "TRIG", "HDL", "LABVLDL", "LDLCALC", "CHOLHDL" in the last 168 hours.  Hematology Recent Labs  Lab 08/23/23 1535 08/24/23 0036 08/25/23 0542  WBC 10.2 6.9 4.8  RBC 4.50 3.75* 3.65*  HGB 13.1 11.1* 11.1*  HCT 38.1 32.1* 30.8*  MCV 84.7 85.6 84.4  MCH 29.1 29.6 30.4  MCHC 34.4 34.6 36.0  RDW 13.8 13.8 13.9  PLT 212 189 152   Thyroid No results for input(s): "TSH", "FREET4" in the last 168 hours.  BNPNo results for input(s): "BNP", "PROBNP" in the last 168 hours.  DDimer No results for input(s): "DDIMER" in the last 168 hours.   Radiology/Studies:  CT HEAD WO CONTRAST ( )  Result Date: 08/24/2023 CLINICAL DATA:  Syncope/presyncope EXAM: CT HEAD WITHOUT CONTRAST TECHNIQUE: Contiguous axial images were obtained from the base of the skull through the vertex without intravenous contrast. RADIATION DOSE REDUCTION: This exam was performed according to the departmental dose-optimization program which includes automated exposure control, adjustment of the mA and/or kV according to patient size and/or use of iterative reconstruction technique. COMPARISON:  Brain MRI 01/08/2014, CT head 03/30/2019 FINDINGS: Brain: There is no acute intracranial hemorrhage, extra-axial fluid collection, or acute infarct There is mild for age parenchymal volume loss with prominence of the ventricular system and extra-axial CSF spaces. Gray-white differentiation is preserved. Patchy and confluent hypodensity in the supratentorial white matter likely reflects sequela of moderate underlying chronic small-vessel ischemic change The pituitary and suprasellar region are normal. There is  no mass lesion. There is no mass effect or midline shift. Vascular: There is calcification of the bilateral carotid siphons. Skull: Normal. Negative for fracture or focal lesion. Sinuses/Orbits: The imaged paranasal sinuses are clear. The globes and orbits are unremarkable. Other: The mastoid air cells and middle ear cavities are clear. IMPRESSION: No acute intracranial pathology. Electronically Signed   By: Lesia Hausen M.D.   On: 08/24/2023 16:19   ECHOCARDIOGRAM COMPLETE  Result Date: 08/24/2023    ECHOCARDIOGRAM REPORT   Patient Name:   Katelyn Sparks Date of Exam: 08/24/2023 Medical Rec #:  [id number]      Height:       65.0 in Accession #:    [id number]     Weight:       132.5 lb Date of Birth:  07-20-1935       BSA:          1.661 m Patient Age:    88 years       BP:           122/64 mmHg Patient Gender: F              HR:           72 bpm. Exam Location:  Inpatient Procedure: 2D Echo, Cardiac Doppler and Color Doppler Indications:    Syncope  History:        Patient has no prior history of Echocardiogram examinations.  Sonographer:    Darlys Gales Referring Phys: [id number] MARGARET E PRAY IMPRESSIONS  1. Left ventricular ejection fraction, by estimation, is 65 to 70%. The left ventricle has hyperdynamic function. The left ventricle has no regional wall motion  0.77  CALCIUM 8.4* 8.4*  --  8.5*  MG  --   --  1.9  --   GFRNONAA >60 58*  --  >60  ANIONGAP 14 8  --  8    No results for input(s): "PROT", "ALBUMIN", "AST", "ALT", "ALKPHOS", "BILITOT" in the last 168 hours. Lipids No results for input(s): "CHOL", "TRIG", "HDL", "LABVLDL", "LDLCALC", "CHOLHDL" in the last 168 hours.  Hematology Recent Labs  Lab 08/23/23 1535 08/24/23 0036 08/25/23 0542  WBC 10.2 6.9 4.8  RBC 4.50 3.75* 3.65*  HGB 13.1 11.1* 11.1*  HCT 38.1 32.1* 30.8*  MCV 84.7 85.6 84.4  MCH 29.1 29.6 30.4  MCHC 34.4 34.6 36.0  RDW 13.8 13.8 13.9  PLT 212 189 152   Thyroid No results for input(s): "TSH", "FREET4" in the last 168 hours.  BNPNo results for input(s): "BNP", "PROBNP" in the last 168 hours.  DDimer No results for input(s): "DDIMER" in the last 168 hours.   Radiology/Studies:  CT HEAD WO CONTRAST ( )  Result Date: 08/24/2023 CLINICAL DATA:  Syncope/presyncope EXAM: CT HEAD WITHOUT CONTRAST TECHNIQUE: Contiguous axial images were obtained from the base of the skull through the vertex without intravenous contrast. RADIATION DOSE REDUCTION: This exam was performed according to the departmental dose-optimization program which includes automated exposure control, adjustment of the mA and/or kV according to patient size and/or use of iterative reconstruction technique. COMPARISON:  Brain MRI 01/08/2014, CT head 03/30/2019 FINDINGS: Brain: There is no acute intracranial hemorrhage, extra-axial fluid collection, or acute infarct There is mild for age parenchymal volume loss with prominence of the ventricular system and extra-axial CSF spaces. Gray-white differentiation is preserved. Patchy and confluent hypodensity in the supratentorial white matter likely reflects sequela of moderate underlying chronic small-vessel ischemic change The pituitary and suprasellar region are normal. There is  no mass lesion. There is no mass effect or midline shift. Vascular: There is calcification of the bilateral carotid siphons. Skull: Normal. Negative for fracture or focal lesion. Sinuses/Orbits: The imaged paranasal sinuses are clear. The globes and orbits are unremarkable. Other: The mastoid air cells and middle ear cavities are clear. IMPRESSION: No acute intracranial pathology. Electronically Signed   By: Lesia Hausen M.D.   On: 08/24/2023 16:19   ECHOCARDIOGRAM COMPLETE  Result Date: 08/24/2023    ECHOCARDIOGRAM REPORT   Patient Name:   Katelyn Sparks Date of Exam: 08/24/2023 Medical Rec #:  [id number]      Height:       65.0 in Accession #:    [id number]     Weight:       132.5 lb Date of Birth:  07-20-1935       BSA:          1.661 m Patient Age:    88 years       BP:           122/64 mmHg Patient Gender: F              HR:           72 bpm. Exam Location:  Inpatient Procedure: 2D Echo, Cardiac Doppler and Color Doppler Indications:    Syncope  History:        Patient has no prior history of Echocardiogram examinations.  Sonographer:    Darlys Gales Referring Phys: [id number] MARGARET E PRAY IMPRESSIONS  1. Left ventricular ejection fraction, by estimation, is 65 to 70%. The left ventricle has hyperdynamic function. The left ventricle has no regional wall motion  0.77  CALCIUM 8.4* 8.4*  --  8.5*  MG  --   --  1.9  --   GFRNONAA >60 58*  --  >60  ANIONGAP 14 8  --  8    No results for input(s): "PROT", "ALBUMIN", "AST", "ALT", "ALKPHOS", "BILITOT" in the last 168 hours. Lipids No results for input(s): "CHOL", "TRIG", "HDL", "LABVLDL", "LDLCALC", "CHOLHDL" in the last 168 hours.  Hematology Recent Labs  Lab 08/23/23 1535 08/24/23 0036 08/25/23 0542  WBC 10.2 6.9 4.8  RBC 4.50 3.75* 3.65*  HGB 13.1 11.1* 11.1*  HCT 38.1 32.1* 30.8*  MCV 84.7 85.6 84.4  MCH 29.1 29.6 30.4  MCHC 34.4 34.6 36.0  RDW 13.8 13.8 13.9  PLT 212 189 152   Thyroid No results for input(s): "TSH", "FREET4" in the last 168 hours.  BNPNo results for input(s): "BNP", "PROBNP" in the last 168 hours.  DDimer No results for input(s): "DDIMER" in the last 168 hours.   Radiology/Studies:  CT HEAD WO CONTRAST ( )  Result Date: 08/24/2023 CLINICAL DATA:  Syncope/presyncope EXAM: CT HEAD WITHOUT CONTRAST TECHNIQUE: Contiguous axial images were obtained from the base of the skull through the vertex without intravenous contrast. RADIATION DOSE REDUCTION: This exam was performed according to the departmental dose-optimization program which includes automated exposure control, adjustment of the mA and/or kV according to patient size and/or use of iterative reconstruction technique. COMPARISON:  Brain MRI 01/08/2014, CT head 03/30/2019 FINDINGS: Brain: There is no acute intracranial hemorrhage, extra-axial fluid collection, or acute infarct There is mild for age parenchymal volume loss with prominence of the ventricular system and extra-axial CSF spaces. Gray-white differentiation is preserved. Patchy and confluent hypodensity in the supratentorial white matter likely reflects sequela of moderate underlying chronic small-vessel ischemic change The pituitary and suprasellar region are normal. There is  no mass lesion. There is no mass effect or midline shift. Vascular: There is calcification of the bilateral carotid siphons. Skull: Normal. Negative for fracture or focal lesion. Sinuses/Orbits: The imaged paranasal sinuses are clear. The globes and orbits are unremarkable. Other: The mastoid air cells and middle ear cavities are clear. IMPRESSION: No acute intracranial pathology. Electronically Signed   By: Lesia Hausen M.D.   On: 08/24/2023 16:19   ECHOCARDIOGRAM COMPLETE  Result Date: 08/24/2023    ECHOCARDIOGRAM REPORT   Patient Name:   Katelyn Sparks Date of Exam: 08/24/2023 Medical Rec #:  [id number]      Height:       65.0 in Accession #:    [id number]     Weight:       132.5 lb Date of Birth:  07-20-1935       BSA:          1.661 m Patient Age:    88 years       BP:           122/64 mmHg Patient Gender: F              HR:           72 bpm. Exam Location:  Inpatient Procedure: 2D Echo, Cardiac Doppler and Color Doppler Indications:    Syncope  History:        Patient has no prior history of Echocardiogram examinations.  Sonographer:    Darlys Gales Referring Phys: [id number] MARGARET E PRAY IMPRESSIONS  1. Left ventricular ejection fraction, by estimation, is 65 to 70%. The left ventricle has hyperdynamic function. The left ventricle has no regional wall motion  0.77  CALCIUM 8.4* 8.4*  --  8.5*  MG  --   --  1.9  --   GFRNONAA >60 58*  --  >60  ANIONGAP 14 8  --  8    No results for input(s): "PROT", "ALBUMIN", "AST", "ALT", "ALKPHOS", "BILITOT" in the last 168 hours. Lipids No results for input(s): "CHOL", "TRIG", "HDL", "LABVLDL", "LDLCALC", "CHOLHDL" in the last 168 hours.  Hematology Recent Labs  Lab 08/23/23 1535 08/24/23 0036 08/25/23 0542  WBC 10.2 6.9 4.8  RBC 4.50 3.75* 3.65*  HGB 13.1 11.1* 11.1*  HCT 38.1 32.1* 30.8*  MCV 84.7 85.6 84.4  MCH 29.1 29.6 30.4  MCHC 34.4 34.6 36.0  RDW 13.8 13.8 13.9  PLT 212 189 152   Thyroid No results for input(s): "TSH", "FREET4" in the last 168 hours.  BNPNo results for input(s): "BNP", "PROBNP" in the last 168 hours.  DDimer No results for input(s): "DDIMER" in the last 168 hours.   Radiology/Studies:  CT HEAD WO CONTRAST ( )  Result Date: 08/24/2023 CLINICAL DATA:  Syncope/presyncope EXAM: CT HEAD WITHOUT CONTRAST TECHNIQUE: Contiguous axial images were obtained from the base of the skull through the vertex without intravenous contrast. RADIATION DOSE REDUCTION: This exam was performed according to the departmental dose-optimization program which includes automated exposure control, adjustment of the mA and/or kV according to patient size and/or use of iterative reconstruction technique. COMPARISON:  Brain MRI 01/08/2014, CT head 03/30/2019 FINDINGS: Brain: There is no acute intracranial hemorrhage, extra-axial fluid collection, or acute infarct There is mild for age parenchymal volume loss with prominence of the ventricular system and extra-axial CSF spaces. Gray-white differentiation is preserved. Patchy and confluent hypodensity in the supratentorial white matter likely reflects sequela of moderate underlying chronic small-vessel ischemic change The pituitary and suprasellar region are normal. There is  no mass lesion. There is no mass effect or midline shift. Vascular: There is calcification of the bilateral carotid siphons. Skull: Normal. Negative for fracture or focal lesion. Sinuses/Orbits: The imaged paranasal sinuses are clear. The globes and orbits are unremarkable. Other: The mastoid air cells and middle ear cavities are clear. IMPRESSION: No acute intracranial pathology. Electronically Signed   By: Lesia Hausen M.D.   On: 08/24/2023 16:19   ECHOCARDIOGRAM COMPLETE  Result Date: 08/24/2023    ECHOCARDIOGRAM REPORT   Patient Name:   Katelyn Sparks Date of Exam: 08/24/2023 Medical Rec #:  [id number]      Height:       65.0 in Accession #:    [id number]     Weight:       132.5 lb Date of Birth:  07-20-1935       BSA:          1.661 m Patient Age:    88 years       BP:           122/64 mmHg Patient Gender: F              HR:           72 bpm. Exam Location:  Inpatient Procedure: 2D Echo, Cardiac Doppler and Color Doppler Indications:    Syncope  History:        Patient has no prior history of Echocardiogram examinations.  Sonographer:    Darlys Gales Referring Phys: [id number] MARGARET E PRAY IMPRESSIONS  1. Left ventricular ejection fraction, by estimation, is 65 to 70%. The left ventricle has hyperdynamic function. The left ventricle has no regional wall motion  Cardiology Consultation   Patient ID: Katelyn Sparks MRN: [id number]; DOB: 02-10-1935  Admit date: 08/23/2023 Date of Consult: 08/25/2023  PCP:  Ivonne Andrew, NP   [facility] Providers Cardiologist:   Click here to update MD or APP on Care Team, Refresh:1}     Patient Profile:   Katelyn Sparks is a 88 y.o. female with a hx of dementia who is being seen 08/25/2023 for the evaluation of syncope at the request of Dr. Miquel Dunn.  History of Present Illness:   Ms. Koetz is an 88 year old female with a history of dementia.  She apparently had an episode of syncope.  Echocardiogram was performed yesterday and reveals hyperdynamic left ventricular systolic function with EF of 65 to 70% There is moderate asymmetric LVH.  There is LVOT gradient of 42 mmHg.  She has grade 1 diastolic dysfunction.  She has a very caring family at her bedside  Has near constant care  Last Tuesday while on the commode she had an episode of syncope.  The son at the bedside did not know if she was straining to have a bowel movement or what the circumstances were.  He also does not know if she had had much to eat or drink that day.  They see her for every meal and try to make sure that she get something to eat and drink on a regular basis.  They do not recall any other episodes of syncope.     Past Medical History:  Diagnosis Date   Blood in stool    Closed dislocation of left shoulder 03/2019   Dementia (HCC)    Esophageal reflux    Flatulence, eructation, and gas pain    Osteoarthrosis, unspecified whether generalized or localized, unspecified site    Unspecified hemorrhoids without mention of complication     Past Surgical History:  Procedure Laterality Date   ABDOMINAL HYSTERECTOMY     COLONOSCOPY  11/2010   UPPER GI ENDOSCOPY  11/2010     Home Medications:  Prior to Admission medications   Medication Sig Start Date End Date Taking? Authorizing Provider  memantine (NAMENDA XR)  28 MG CP24 24 hr capsule TAKE ONE CAPSULE BY MOUTH DAILY 07/27/23  Yes Ivonne Andrew, NP  omeprazole (PRILOSEC) 40 MG capsule Take 1 capsule (40 mg total) by mouth daily. Patient not taking: Reported on 12/10/2019 10/17/18 05/31/20  Kallie Locks, FNP    Inpatient Medications: Scheduled Meds:  enoxaparin (LOVENOX) injection  40 mg Subcutaneous Q24H   traZODone  50 mg Oral Once   Continuous Infusions:  PRN Meds:   Allergies:   No Known Allergies  Social History:   Social History   Socioeconomic History   Marital status: Widowed    Spouse name: Not on file   Number of children: 2   Years of education: 47   Highest education level: Not on file  Occupational History   Occupation: Retired    Associate Professor: RETIRED  Tobacco Use   Smoking status: Never   Smokeless tobacco: Never  Vaping Use   Vaping status: Never Used  Substance and Sexual Activity   Alcohol use: No    Alcohol/week: 0.0 standard drinks of alcohol   Drug use: No   Sexual activity: Not Currently  Other Topics Concern   Not on file  Social History Narrative   She is a retired from a tobacco company.   Her husband passed away in 09/03/00.  She has two son.

## 2023-08-25 NOTE — Progress Notes (Signed)
Per nursing request, went to patient room to speak with the family members about the patient's problems.  They had questions about the patient's echocardiogram. I described what hypertrophic obstructive cardiomyopathy (HOCM) is and the basics of management approaches. Provided them with a verbal overview, then gave them some more detailed information about medical management via a handout from [facility].  They had additional questions about the patient's needs for dental care. Told them that if a dentist recommended she have dental extractions, then that would be an important procedure not only for her comfort, but also to reduce the likelihood of oral bacteria from seeding new infections via hematogenous spread.  Spent about 30 minutes doing patient education.

## 2023-08-25 NOTE — Assessment & Plan Note (Addendum)
Mental decline over past few years with increased deterioration in the past few months per family. Received haldol yesterday (9/12) at ~13:30 and Seroquel at ~15:30 due to increasing agitation. TraZODone 50 mg given at ~20:30 due to delirium and sleep disturbance.  -Delirium precautions and fall precautions in place -Trazodone 50 mg PRN for agitation/ delirium -Sitter at bedside -Continue calling son to verify medication list

## 2023-08-25 NOTE — Assessment & Plan Note (Addendum)
Most likely causes include dehydration/ hypovolemia and/or orthostatic hypotension. EKG nonconcerning for MI, however did show evidence of prolonged Qtc (501). Troponin levels flattened. Orthostatic vitals were unremarkable. Head CT did not reveal any acute pathology. Echocardiogram revealed findings consistent with hypertrophic obstructive cardiomyopathy and grade I diastolic dysfunction. Cardiology consulted to further evaluate cardiac pathology as cause of syncope. -Cardiology consulted, appreciate recommendations -Discontinue LR IVF -Discontinued cardiac monitoring  -Fall precautions in place -PT/OT consulted -Potassium 40 ordered

## 2023-08-25 NOTE — Plan of Care (Signed)

## 2023-08-25 NOTE — Progress Notes (Addendum)
Daily Progress Note Intern Pager: [phone]  Patient name: Katelyn Sparks Medical record number: [id number] Date of birth: June 20, 1935 Age: 88 y.o. Gender: female  Primary Care Provider: Ivonne Andrew, NP Consultants: Cardiology  Code Status: Full  Pt Overview and Major Events to Date:  9/11: Admitted  Assessment and Plan: Katelyn Sparks is an 88 y.o female who presented on 9/11 post-syncopal episode of unspecified cause. Differentials include syncope secondary to dehydration/ hypovolemia, arrythmia (EKG continues to be largely unremarkable making this less likely), vasovagal syncope (patient was on toilet when event occurred) or multifactorial cause. She has past medical history of dementia and osetoarthrosis. Patient is currently stable and resting comfortably. Assessment & Plan Syncope and collapse Most likely causes include dehydration/ hypovolemia and/or orthostatic hypotension. EKG nonconcerning for MI, however did show evidence of prolonged Qtc (501). Troponin levels flattened. Orthostatic vitals were unremarkable. Head CT did not reveal any acute pathology. Echocardiogram revealed findings consistent with hypertrophic obstructive cardiomyopathy and grade I diastolic dysfunction. Cardiology consulted to further evaluate cardiac pathology as cause of syncope. -Cardiology consulted, appreciate recommendations -Discontinue LR IVF -Discontinued cardiac monitoring  -Fall precautions in place -PT/OT consulted -Potassium 40 ordered  Dementia (HCC) Mental decline over past few years with increased deterioration in the past few months per family. Received haldol yesterday (9/12) at ~13:30 and Seroquel at ~15:30 due to increasing agitation. TraZODone 50 mg given at ~20:30 due to delirium and sleep disturbance.  -Delirium precautions and fall precautions in place -Trazodone 50 mg PRN for agitation/ delirium -Sitter at bedside -Continue calling son to verify medication list    Chronic, stable conditions: GERD  FEN/GI: Full, regular diet PPx: Lovenox Dispo: Pending clinical improvement. Barriers include advanced dementia   Subjective:  Patient spoken to at bedside. Resting with sitter at bedside, patient appears calm and pleasatnly demented. She is oriented to self. Patient rested comfortably last night per nursing staff. She is slightly more groggy this AM, likely secondary to traZODone administration.  Objective: Temp:  [97.3 F (36.3 C)-98.6 F (37 C)] 98.6 F (37 C) (09/13 0527) Pulse Rate:  [66-71] 66 (09/13 0527) Resp:  [16-18] 18 (09/13 0527) BP: (122-152)/(62-84) 138/74 (09/13 0527) SpO2:  [97 %-100 %] 100 % (09/13 0527) Weight:  [56.3 kg] 56.3 kg (09/13 0527)  Physical Exam: General: Alert, resting comfortably. Oriented to self. Cardiovascular: Regular rate and rhythm. Skin warm and dry Respiratory: Lungs clear to auscultation bilaterally. Normal WOB on room air Abdomen: Soft, non-tender, non-distended Extremities: Spontaneous movement of all 4 extremities. No pitting edema  Laboratory: Most recent CBC Lab Results  Component Value Date   WBC 6.9 08/24/2023   HGB 11.1 (L) 08/24/2023   HCT 32.1 (L) 08/24/2023   MCV 85.6 08/24/2023   PLT 189 08/24/2023   Most recent BMP    Latest Ref Rng & Units 08/25/2023    5:42 AM  BMP  Glucose 70 - 99 mg/dL 96   BUN 8 - 23 mg/dL 10   Creatinine 0.27 - 1.00 mg/dL 2.53   Sodium 664 - 403 mmol/L 140   Potassium 3.5 - 5.1 mmol/L 3.4   Chloride 98 - 111 mmol/L 110   CO2 22 - 32 mmol/L 22   Calcium 8.9 - 10.3 mg/dL 8.5   Other pertinent labs: None  Imaging/Diagnostic Tests: CT Head w/o contrast Impression: No acute intracranial pathology   Echocardiogram Impressions: LV EF 65-70% LV hyperdynamic function, asymmetric LV hypertrophy of septal segment. Outflow gradiant peak 42 mmHg.  Grade I diastolic dysfunction (impaired relaxation). Findings consistent with hypertrophic obstructive  cardiomyopathy.  Katelyn Sparks, Medical Student 08/25/2023, 7:10 AM  Medical Student, [facility] FPTS Intern pager: [phone], text pages welcome Secure chat group [facility] Teaching Service    Resident attestation: I agree with the documentation of Student Dr. Roselyn Bering above. I have made adjustments to her note as appropriate. I have seen the patient and performed physical exam on the patient consistent with her documented physical exam above.  Alfredo Martinez, MD

## 2023-08-26 DIAGNOSIS — Z515 Encounter for palliative care: Secondary | ICD-10-CM | POA: Diagnosis not present

## 2023-08-26 DIAGNOSIS — I421 Obstructive hypertrophic cardiomyopathy: Secondary | ICD-10-CM | POA: Diagnosis present

## 2023-08-26 DIAGNOSIS — R55 Syncope and collapse: Secondary | ICD-10-CM | POA: Diagnosis present

## 2023-08-26 DIAGNOSIS — Z79899 Other long term (current) drug therapy: Secondary | ICD-10-CM | POA: Diagnosis not present

## 2023-08-26 DIAGNOSIS — Z8249 Family history of ischemic heart disease and other diseases of the circulatory system: Secondary | ICD-10-CM | POA: Diagnosis not present

## 2023-08-26 DIAGNOSIS — K219 Gastro-esophageal reflux disease without esophagitis: Secondary | ICD-10-CM | POA: Diagnosis present

## 2023-08-26 DIAGNOSIS — F03C11 Unspecified dementia, severe, with agitation: Secondary | ICD-10-CM | POA: Diagnosis present

## 2023-08-26 DIAGNOSIS — Z7189 Other specified counseling: Secondary | ICD-10-CM | POA: Diagnosis not present

## 2023-08-26 DIAGNOSIS — Z833 Family history of diabetes mellitus: Secondary | ICD-10-CM | POA: Diagnosis not present

## 2023-08-26 DIAGNOSIS — E861 Hypovolemia: Secondary | ICD-10-CM | POA: Diagnosis present

## 2023-08-26 MED ORDER — QUETIAPINE FUMARATE 25 MG PO TABS
25.0000 mg | ORAL_TABLET | Freq: Once | ORAL | Status: AC
  Administered 2023-08-26: 25 mg via ORAL
  Filled 2023-08-26: qty 1

## 2023-08-26 MED ORDER — QUETIAPINE FUMARATE 25 MG PO TABS
50.0000 mg | ORAL_TABLET | Freq: Every day | ORAL | Status: DC
  Administered 2023-08-26: 50 mg via ORAL
  Filled 2023-08-26: qty 2

## 2023-08-26 NOTE — Assessment & Plan Note (Signed)
Noted on Echo. Per Cards, pt is not a candidate for implanted devices. Started metop yesterday, remains hemodynamically stable. - Cont Metop tartate 12.5 mg BID. If remains hemodynamically stable, consider transitioning to succinate 25mg  daily prior to DC.

## 2023-08-26 NOTE — Progress Notes (Addendum)
Daily Progress Note Intern Pager: [phone]  Patient name: Katelyn Sparks Medical record number: [id number] Date of birth: [birth year] Age: 88 y.o. Gender: female  Primary Care Provider: Ivonne Andrew, NP Consultants: Cardiology Code Status: Full  Pt Overview and Major Events to Date:  9/11 Admitted  Assessment and Plan: [name] is an 88 y.o female w/ hx of dementia, OA, GERD that p/w syncope. Syncope is thought to be most likely vasovagal vs HOCM.  Assessment & Plan Syncope and collapse No further syncope events. -Cardiology consulted, appreciate recommendations -Fall precautions in place -PT/OT consulted Dementia (HCC) Required Seroquel 25 x 2 last night for agitation. Suspect that continued hospitalization will further worsen dementia/confusion. - Seroquel 50mg  nightly - Palliative consult. Will need GOC duscussion with family to guide dispo planning. - Delirium and fall precautions HOCM (hypertrophic obstructive cardiomyopathy) (HCC) Noted on Echo. Per Cards, pt is not a candidate for implanted devices. Started metop yesterday, remains hemodynamically stable. - Cont Metop tartate 12.5 mg BID. If remains hemodynamically stable, consider transitioning to succinate 25mg  daily prior to DC.   FEN/GI: Regular PPx: Lovenox Dispo: pending clinical improvement . Barriers include worsening AMS and need for syncope workup.   Subjective:  Spoke to son Katelyn Sparks at bedside.  Discussed HOCM findings, Cards recs, and current treatment plan. Son expressed understanding. He will be in town for this upcoming week (visiting from [place]) and hopes to be able to set up outpt care and services for pt. He is worried about her care at home and her worsening dementia.  Objective: Temp:  [97.4 F (36.3 C)-98.1 F (36.7 C)] 97.4 F (36.3 C) (09/14 0739) Pulse Rate:  [60-70] 60 (09/14 0739) Resp:  [18] 18 (09/14 0739) BP: (138-155)/(74-88) 155/88 (09/14 0739) SpO2:  [98 %-100 %] 98 %  (09/14 0739) Physical Exam: General: Alert, pleasantly demented woman laying in bed. Eating pancakes fed by son.  HEENT: NCAT. MMM. Neuro: A and oriented to self, not to time or place. Cardiovascular: RRR, no murmurs. Respiratory: Normal WOB on RA.  Abdomen: Soft, nontender, nondistended. Normal BS.   Laboratory: Most recent CBC Lab Results  Component Value Date   WBC 4.8 08/25/2023   HGB 11.1 (L) 08/25/2023   HCT 30.8 (L) 08/25/2023   MCV 84.4 08/25/2023   PLT 152 08/25/2023   Most recent BMP    Latest Ref Rng & Units 08/25/2023    5:42 AM  BMP  Glucose 70 - 99 mg/dL 96   BUN 8 - 23 mg/dL 10   Creatinine 1.30 - 1.00 mg/dL 8.65   Sodium 784 - 696 mmol/L 140   Potassium 3.5 - 5.1 mmol/L 3.4   Chloride 98 - 111 mmol/L 110   CO2 22 - 32 mmol/L 22   Calcium 8.9 - 10.3 mg/dL 8.5    Lincoln Brigham, MD 08/26/2023, 1:14 PM  PGY-2, [facility] FPTS Intern pager: [phone], text pages welcome Secure chat group [facility] Teaching Service

## 2023-08-26 NOTE — Assessment & Plan Note (Signed)
No further syncope events. -Cardiology consulted, appreciate recommendations -Fall precautions in place -PT/OT consulted

## 2023-08-26 NOTE — Progress Notes (Addendum)
   Dr Harvie Bridge consult note reviewed, no additional cardiology recs at this time. Finding of HOCM by echo in elderly patient with severe dementia. Would not be an ICD candidate so would not pursue risk stratification testing. Reasonable alternative to the etiology of the commode being a vasovagal event given it happened on the commode. Needs to stay hydrated, started on beta blocker therapy. We can arrange outpatient f/u.     For questions or updates, please contact [facility] Please consult [email or web address] for contact info under        Signed, Dina Rich, MD  08/26/2023, 11:26 AM

## 2023-08-26 NOTE — Assessment & Plan Note (Signed)
Stable overnight. Suspect that continued hospitalization will further worsen dementia/confusion. - Seroquel 50mg  nightly - Palliative consult. Will need GOC duscussion with family to guide dispo planning. - Delirium and fall precautions

## 2023-08-26 NOTE — Assessment & Plan Note (Addendum)
Stable overnight. Suspect that continued hospitalization will further worsen dementia/confusion. - Seroquel 50mg  nightly - Palliative consult. Will need GOC duscussion with family to guide dispo planning. - Delirium and fall precautions

## 2023-08-26 NOTE — Plan of Care (Signed)

## 2023-08-26 NOTE — Progress Notes (Signed)
Daily Progress Note Intern Pager: [phone]  Patient name: Katelyn Sparks Medical record number: [id number] Date of birth: [birth year] Age: 88 y.o. Gender: female  Primary Care Provider: Ivonne Andrew, NP Consultants: Cardiology Code Status: Full code   Pt Overview and Major Events to Date:  9/11: Admitted   Assessment and Plan:  Katelyn Sparks is an 88 y.o. female presenting with syncope. Pertinent PMH/PSH includes dementia, OA, and GERD.  Assessment & Plan Syncope and collapse No further syncope events. -Cardiology consulted, appreciate recommendations -Fall precautions in place -PT/OT consulted Dementia (HCC) Required Seroquel 25 x 2 last night for agitation. Suspect that continued hospitalization will further worsen dementia/confusion. - Seroquel 50mg  nightly - Palliative consult. Will need GOC duscussion with family to guide dispo planning. - Delirium and fall precautions HOCM (hypertrophic obstructive cardiomyopathy) (HCC) Noted on Echo. Per Cards, pt is not a candidate for implanted devices. Started metop yesterday, remains hemodynamically stable. - Cont Metop tartate 12.5 mg BID. If remains hemodynamically stable, consider transitioning to succinate 25mg  daily prior to DC.  Syncope   FEN/GI: regular PPx: Lovenox Dispo:{FPTSDISOLIST:27587} {FPTSDISOTIME:27588}. Barriers include ***.   Subjective:  ***  Objective: Temp:  [97.4 F (36.3 C)-98.1 F (36.7 C)] 98.1 F (36.7 C) (09/14 2000) Pulse Rate:  [60-67] 67 (09/14 2000) Resp:  [17-18] 17 (09/14 2000) BP: (147-156)/(78-88) 156/80 (09/14 2000) SpO2:  [98 %-100 %] 100 % (09/14 2000) Physical Exam: General: *** Cardiovascular: *** Respiratory: *** Abdomen: *** Extremities: ***  Laboratory: Most recent CBC Lab Results  Component Value Date   WBC 4.8 08/25/2023   HGB 11.1 (L) 08/25/2023   HCT 30.8 (L) 08/25/2023   MCV 84.4 08/25/2023   PLT 152 08/25/2023   Most recent BMP    Latest Ref Rng &  Units 08/25/2023    5:42 AM  BMP  Glucose 70 - 99 mg/dL 96   BUN 8 - 23 mg/dL 10   Creatinine 1.30 - 1.00 mg/dL 8.65   Sodium 784 - 696 mmol/L 140   Potassium 3.5 - 5.1 mmol/L 3.4   Chloride 98 - 111 mmol/L 110   CO2 22 - 32 mmol/L 22   Calcium 8.9 - 10.3 mg/dL 8.5     Katelyn Chard, DO 08/26/2023, 8:12 PM  PGY-2, [place] [facility] FPTS Intern pager: [phone], text pages welcome Secure chat group [facility] Teaching Service

## 2023-08-27 ENCOUNTER — Other Ambulatory Visit: Payer: Self-pay

## 2023-08-27 DIAGNOSIS — Z515 Encounter for palliative care: Secondary | ICD-10-CM

## 2023-08-27 DIAGNOSIS — R55 Syncope and collapse: Secondary | ICD-10-CM | POA: Diagnosis not present

## 2023-08-27 DIAGNOSIS — Z7189 Other specified counseling: Secondary | ICD-10-CM

## 2023-08-27 MED ORDER — HALOPERIDOL LACTATE 5 MG/ML IJ SOLN
2.0000 mg | Freq: Once | INTRAMUSCULAR | Status: AC
  Administered 2023-08-27: 2 mg via INTRAVENOUS
  Filled 2023-08-27: qty 1

## 2023-08-27 MED ORDER — QUETIAPINE FUMARATE 25 MG PO TABS
50.0000 mg | ORAL_TABLET | Freq: Every day | ORAL | Status: DC
  Administered 2023-08-27: 50 mg via ORAL
  Filled 2023-08-27: qty 2

## 2023-08-27 NOTE — Progress Notes (Signed)
Attended family meeting to discuss goals of care with palliative care NP Sarina Ser.   We provided an overview of the process of dementia and end of life care/decision making.  I provided a medical overview of what hypertrophic obstructive cardiomyopathy is. How it is managed medically and what the prognosis generally looks like. We described what it means for a patient to be full code vs limited intervention vs comfort care.   Ms Ileene Hutchinson then provided information on the stage of Ms Blanchet's dementia (currently stage 5) and what dementia looks like as it progresses (and how hospice services really only become available at stage [facility]).  She provided info on home health relating to PACE and Guide resources and explained some of the differences.  We provided ample opportunities for various family members to ask questions and answered all questions.  We mentioned that the social work / transitions of care team would be in touch to set up.   Signed: Christie Nottingham, MD [facility] Health Physician 08/27/2023 3:29 PM

## 2023-08-27 NOTE — Plan of Care (Signed)
Problem: Clinical Measurements: Goal: Ability to maintain clinical measurements within normal limits will improve Outcome: Progressing Goal: Will remain free from infection Outcome: Progressing Goal: Diagnostic test results will improve Outcome: Progressing Goal: Respiratory complications will improve Outcome: Progressing Goal: Cardiovascular complication will be avoided Outcome: Progressing   Problem: Nutrition: Goal: Adequate nutrition will be maintained Outcome: Progressing   Problem: Elimination: Goal: Will not experience complications related to bowel motility Outcome: Progressing Goal: Will not experience complications related to urinary retention Outcome: Progressing   Problem: Pain Managment: Goal: General experience of comfort will improve Outcome: Progressing   Problem: Skin Integrity: Goal: Risk for impaired skin integrity will decrease Outcome: Progressing

## 2023-08-27 NOTE — Consult Note (Signed)
Palliative Care Consult Note                                  Date: 08/27/2023   Patient Name: Katelyn Sparks  DOB: 1935-11-07  MRN: [id number]  Age / Sex: 88 y.o., female  PCP: Ivonne Andrew, NP Referring Physician: Billey Co, MD  Reason for Consultation: Establishing goals of care  HPI/Patient Profile: 88 y.o. female  with past medical history of dementia, OA, and GERD admitted on 08/23/2023 with syncope. She was found to have hypertrophic obstructive cardiomyopathy.  Past Medical History:  Diagnosis Date   Blood in stool    Closed dislocation of left shoulder 03/2019   Dementia (HCC)    Esophageal reflux    Flatulence, eructation, and gas pain    Osteoarthrosis, unspecified whether generalized or localized, unspecified site    Unspecified hemorrhoids without mention of complication     Subjective:   I have reviewed medical records including EPIC notes, labs and imaging, assessed the patient and then Dr. Weston Settle and I met in the patient's room with the patient's sons, daughter in law, and grandchildren to discuss diagnosis prognosis, GOC, EOL wishes, disposition and options. The patient was unable to participate in the conversation  I introduced Palliative Medicine as specialized medical care for people living with serious illness. It focuses on providing relief from symptoms and stress of a serious illness. The goal is to improve quality of life for both the patient and the family.  Today's Discussion: Brief life review of patient. She has two sons, grandchildren, and great grandchildren. She used to work at a tobacco plant. Her family has been helping her with ADLs and IADLs as her dementia has worsened. Her grandson CJ spends a lot of time with her assisting in her care.  The family share that they were surprised to hear the patient has hypertrophic obstructive cardiomyopathy. They understand this will put her at an increased  risk for falls and that this will be medically managed. We discussed that dementia is a terminal illness and what progression could look like for the patient.  A discussion was had today regarding advanced directives. Concepts specific to code status, artifical feeding, and rehospitalization was had.  The difference between a aggressive medical intervention path and a palliative comfort care path for this patient at this time was had. Recommended consideration of DNR status, understanding evidenced-based poor outcomes in similar hospitalized patients, as the cause of the arrest is likely associated with chronic/terminal disease rather than a reversible acute cardio-pulmonary event. Family chooses to keep the patient FULL code and FULL scope at this time.  We discussed support options at discharge such as PACE or GUIDE. The family plans to apply for Medicaid and look into these programs.  Discussed the importance of continued conversation with family and the medical providers regarding overall plan of care and treatment options, ensuring decisions are within the context of the patient's values and GOCs.  Questions and concerns were addressed. Hard Choices booklet left for review. The family was encouraged to call with questions or concerns. PMT will continue to support holistically.  Review of Systems  Unable to perform ROS   Objective:   Primary Diagnoses: Present on Admission:  Syncope and collapse  Dementia (HCC)  Syncope   Physical Exam Vitals reviewed.  HENT:     Head: Normocephalic and atraumatic.  Cardiovascular:  Rate and Rhythm: Normal rate.  Pulmonary:     Effort: Pulmonary effort is normal.  Skin:    General: Skin is warm and dry.  Neurological:     Mental Status: She is alert.  Psychiatric:        Mood and Affect: Mood normal.        Behavior: Behavior normal.     Vital Signs:  BP (!) 141/87 (BP Location: Left Arm)   Pulse 60   Temp 97.8 F (36.6 C) (Oral)    Resp 16   Ht 5\' 5"  (1.651 m)   Wt 56.3 kg   SpO2 100%   BMI 20.65 kg/m    Advanced Care Planning:   Existing Vynca/ACP Documentation: None  Primary Decision Maker: LEGAL GUARDIAN. Chris Leveque the patient's son.  Code Status/Advance Care Planning: Full code   Assessment & Plan:    SUMMARY OF RECOMMENDATIONS   FULL code FULL scope Continued conversation among family re: goals of care Patient will discharge home SW to answer family questions   Discussed with: Dr. Weston Settle  Time Total: 55 minutes  Thank you for allowing [place] to participate in the care of Katelyn Sparks PMT will continue to support holistically.   Signed by: Sarina Ser, NP Palliative Medicine Team  Team Phone # [phone] (Nights/Weekends)  08/27/2023, 1:32 PM

## 2023-08-27 NOTE — Plan of Care (Signed)

## 2023-08-27 NOTE — Progress Notes (Signed)
Notified by RN earlier this evening that patient was combative, agitated.  Advised RN to give bedtime dose of Seroquel 50 mg early.  Notified by RN again that this did not help.  Patient continues to wander out into the hallway, RNs on the floor are having to take turns watching her making it difficult for them to take care of their other patients.  1 dose of IV Haldol 2 mg ordered.

## 2023-08-28 ENCOUNTER — Other Ambulatory Visit (HOSPITAL_COMMUNITY): Payer: Self-pay

## 2023-08-28 DIAGNOSIS — R55 Syncope and collapse: Secondary | ICD-10-CM | POA: Diagnosis not present

## 2023-08-28 MED ORDER — METOPROLOL SUCCINATE ER 25 MG PO TB24
25.0000 mg | ORAL_TABLET | Freq: Every day | ORAL | 0 refills | Status: DC
  Filled 2023-08-28: qty 30, 30d supply, fill #0

## 2023-08-28 NOTE — Discharge Instructions (Addendum)
Dear Katelyn Sparks,   Thank you so much for allowing [place] to be part of your care!  You were admitted to [facility] for loss of consciousness. It was most likely exacerbated by dehydration. The Cardiologists also saw her and she was found  to have Hypertrophic Obstructive Cardiomyopathy, and started her on metoprolol.    POST-HOSPITAL & CARE INSTRUCTIONS Drink plenty of fluids and stay hydrated. Dehydration can increase your risk of losing consciousness.  Continue to take Metoprolol as directed. You have a follow-up with Cardiology 09/19/2023 to monitor her heart. Please let PCP/Specialists know of any changes that were made.  Please see medications section of this packet for any medication changes.   DOCTOR'S APPOINTMENT & FOLLOW UP CARE INSTRUCTIONS  Future Appointments  Date Time Provider Department Center  09/19/2023  2:20 PM Beatrice Lecher, PA-C [facility]    RETURN PRECAUTIONS: Please seek medical attention if: - you lose consciousness - you have difficulty breathing - you have chest pain  Take care and be well!  Family Medicine Teaching Service  [place]  [facility]  [address] [place], [place] [address] [phone]

## 2023-08-28 NOTE — Assessment & Plan Note (Signed)
Noted on Echo. Per Cards, pt is not a candidate for implanted devices. Started metop yesterday, remains hemodynamically stable. - Cont Metop tartate 12.5 mg BID. If remains hemodynamically stable, consider transitioning to succinate 25mg  daily prior to DC.

## 2023-08-28 NOTE — Assessment & Plan Note (Addendum)
Stable overnight. Suspect that continued hospitalization will further worsen dementia/confusion. - Seroquel 50mg  nightly - Palliative consult. Had GOC duscussion with family to guide dispo planning. - Delirium and fall precautions

## 2023-08-28 NOTE — Progress Notes (Signed)
Daily Progress Note Intern Pager: [phone]  Patient name: Katelyn Sparks Medical record number: [id number] Date of birth: 1935/02/23 Age: 88 y.o. Gender: female  Primary Care Provider: Ivonne Andrew, NP Consultants: Cardiology Code Status: Full code  Pt Overview and Major Events to Date:  9/11: Admitted   Assessment and Plan: CHAMEKA GUNNER is an 88 y.o. female presenting with syncope. Pertinent PMH/PSH includes dementia, OA, and GERD.  Assessment & Plan Syncope and collapse No further syncope events. - Cardiology consulted, appreciate recommendations and will follow up outpatient. - Fall precautions in place - PT/OT consulted and recommended home health PT/OT Dementia (HCC) Stable overnight. Suspect that continued hospitalization will further worsen dementia/confusion. - Seroquel 50mg  nightly - Palliative consult. Had GOC duscussion with family to guide dispo planning. - Delirium and fall precautions HOCM (hypertrophic obstructive cardiomyopathy) (HCC) Noted on Echo. Per Cards, pt is not a candidate for implanted devices. Started metop yesterday, remains hemodynamically stable. - Cont Metop tartate 12.5 mg BID. If remains hemodynamically stable, consider transitioning to succinate 25mg  daily prior to DC.    FEN/GI: Regular Diet PPx: Lovenox Dispo: Home with home health today pending son filling out the paperwork.  Subjective:  Patient is resting comfortably in bed with son at bedside. No concerns today and reports she is doing fairly well. Son seems to be ok with going home with home health.   Objective: Temp:  [97.5 F (36.4 C)-98.2 F (36.8 C)] 97.7 F (36.5 C) (09/16 0949) Pulse Rate:  [64-69] 64 (09/16 0949) Resp:  [17] 17 (09/16 0949) BP: (125-142)/(68-129) 142/71 (09/16 0949) SpO2:  [99 %-100 %] 99 % (09/15 2029) Physical Exam: General: NAD, awake and alert HEENT: Normocephalic, atraumatic. Conjunctiva normal. No nasal discharge Cardiovascular: RRR. No  M/R/G Respiratory: CTAB, normal WOB on RA. No wheezing, crackles, rhonchi, or diminished breath sounds. Abdomen: Soft, non-tender, non-distended. Bowel sounds normoactive Extremities: No BLE edema, no deformities or significant joint findings. Skin: Warm and dry.  Laboratory: Most recent CBC Lab Results  Component Value Date   WBC 4.8 08/25/2023   HGB 11.1 (L) 08/25/2023   HCT 30.8 (L) 08/25/2023   MCV 84.4 08/25/2023   PLT 152 08/25/2023   Most recent BMP    Latest Ref Rng & Units 08/25/2023    5:42 AM  BMP  Glucose 70 - 99 mg/dL 96   BUN 8 - 23 mg/dL 10   Creatinine 1.30 - 1.00 mg/dL 8.65   Sodium 784 - 696 mmol/L 140   Potassium 3.5 - 5.1 mmol/L 3.4   Chloride 98 - 111 mmol/L 110   CO2 22 - 32 mmol/L 22   Calcium 8.9 - 10.3 mg/dL 8.5     Imaging/Diagnostic Tests: No new imaging.  Fortunato Curling, DO 08/28/2023, 10:57 AM  PGY-1, [facility] Family [facility] FPTS Intern pager: [phone], text pages welcome Secure chat group [facility] Teaching Service

## 2023-08-28 NOTE — Progress Notes (Signed)
Physical Therapy Treatment Patient Details Name: Katelyn Sparks MRN: [id number] DOB: 1935/02/04 Today's Date: 08/28/2023   History of Present Illness Patient is a 88 year old female presenting after syncopal episode while walking to the shower, second syncopal episode once EMS arrived. History of dementia.    PT Comments  Pt greeted up in chair, with son Katelyn Sparks present at bedside, and agreeable to session with continued progress towards acute goals. Pt BP stable throughout session and pt without c/o dizziness/lightheadedness while up and with all mobility. Pt benefiting from PRN HHA for stability during gait with pt demonstrating some distractibility and decreased stability with environmental scanning, drifting toward direction in which her head turns, however pt without overt LOB with CGA provided for safety. Current plan remains appropriate to address deficits and maximize functional independence and decrease caregiver burden. Pt continues to benefit from skilled PT services to progress toward functional mobility goals.      If plan is discharge home, recommend the following: A little help with walking and/or transfers;A little help with bathing/dressing/bathroom;Supervision due to cognitive status;Help with stairs or ramp for entrance;Assist for transportation;Assistance with cooking/housework;Direct supervision/assist for medications management   Can travel by private vehicle        Equipment Recommendations  None recommended by PT    Recommendations for Other Services       Precautions / Restrictions Precautions Precautions: Fall Restrictions Weight Bearing Restrictions: No     Mobility  Bed Mobility Overal bed mobility: Needs Assistance             General bed mobility comments: up in chair on arrival    Transfers Overall transfer level: Needs assistance Equipment used: None Transfers: Sit to/from Stand Sit to Stand: Contact guard assist           General  transfer comment: standing from recliner with fair stability, good hand placement    Ambulation/Gait Ambulation/Gait assistance: Contact guard assist Gait Distance (Feet): 400 Feet Assistive device: 1 person hand held assist, None Gait Pattern/deviations: Step-through pattern, Decreased stance time - left, Decreased stride length, Drifts right/left Gait velocity: decreased     General Gait Details: patient has occasional drifting to right and left. patient does better when distractions are limited as pt with tendency to drift toward where she is looking, cues for attention to task. no reported dizziness with upright activity   Stairs             Wheelchair Mobility     Tilt Bed    Modified Rankin (Stroke Patients Only)       Balance Overall balance assessment: Needs assistance Sitting-balance support: Feet supported Sitting balance-Leahy Scale: Good     Standing balance support: No upper extremity supported Standing balance-Leahy Scale: Fair                              Cognition Arousal: Alert Behavior During Therapy: Restless, Impulsive Overall Cognitive Status: History of cognitive impairments - at baseline                                 General Comments: patient has history of dementia. she is able to follow single step commands most of the time with increased time and repetition. patient is restless at times and needs cues for attention to task.        Exercises  General Comments General comments (skin integrity, edema, etc.): VSS on RA, pt son Katelyn Sparks present and supportive, BP sitting 120/63, standing 120/64, post ambulation 137/66      Pertinent Vitals/Pain Pain Assessment Pain Assessment: Faces Faces Pain Scale: No hurt Pain Intervention(s): Monitored during session    Home Living                          Prior Function            PT Goals (current goals can now be found in the care plan section)  Acute Rehab PT Goals Patient Stated Goal: family goal is to have home health services at home PT Goal Formulation: With family Time For Goal Achievement: 09/07/23 Progress towards PT goals: Progressing toward goals    Frequency    Min 1X/week      PT Plan      Co-evaluation              AM-PAC PT "6 Clicks" Mobility   Outcome Measure  Help needed turning from your back to your side while in a flat bed without using bedrails?: None Help needed moving from lying on your back to sitting on the side of a flat bed without using bedrails?: A Little Help needed moving to and from a bed to a chair (including a wheelchair)?: A Little Help needed standing up from a chair using your arms (e.g., wheelchair or bedside chair)?: A Little Help needed to walk in hospital room?: A Little Help needed climbing 3-5 steps with a railing? : A Little 6 Click Score: 19    End of Session Equipment Utilized During Treatment: Gait belt Activity Tolerance: Patient tolerated treatment well Patient left: with call bell/phone within reach;with family/visitor present;in chair (son Katelyn Sparks present throughout session) Nurse Communication: Mobility status PT Visit Diagnosis: Muscle weakness (generalized) (M62.81);Unsteadiness on feet (R26.81)     Time: 1610-9604 PT Time Calculation (min) (ACUTE ONLY): 20 min  Charges:    $Gait Training: 8-22 mins PT General Charges $$ ACUTE PT VISIT: 1 Visit                     Onyekachi Gathright R. PTA Acute Rehabilitation Services Office: [phone]   Catalina Antigua 08/28/2023, 10:40 AM

## 2023-08-28 NOTE — TOC Progression Note (Addendum)
Transition of Care [facility]) - Progression Note    Patient Details  Name: Katelyn Sparks MRN: [id number] Date of Birth: 04/02/1935  Transition of Care [facility]) CM/SW Contact  Rachel Samples A [place], [place] Phone Number: 08/28/2023, 11:42 AM  Clinical Narrative:     CSW met with pt at bedside along with son, [name] to provide requested resources for possible home care services at discharge. RNCM set up [facility] with [facility] at discharge, CSW reminded pt's son.   Per previous note, family given information to [facility] as well as [facility].   [name] stated that family member will provide transportation at discharge, possibly this afternoon/evening.   No other needs identified at this time. TOC will sign off, please consult again if TOC needs arise.        Expected Discharge Plan and Services         Expected Discharge Date: 08/28/23                                     Social Determinants of Health (SDOH) Interventions SDOH Screenings   Food Insecurity: No Food Insecurity (08/25/2023)  Housing: Low Risk  (08/25/2023)  Transportation Needs: No Transportation Needs (08/25/2023)  Utilities: Not At Risk (08/25/2023)  Depression (PHQ2-9): Low Risk  (08/20/2021)  Tobacco Use: Low Risk  (08/23/2023)    Readmission Risk Interventions     No data to display

## 2023-08-28 NOTE — Discharge Summary (Addendum)
Family Medicine Teaching [facility] Discharge Summary  Patient name: Katelyn Sparks Medical record number: [id number] Date of birth: 1935-02-21 Age: 88 y.o. Gender: female Date of Admission: 08/23/2023  Date of Discharge: 08/28/23  Admitting Physician: Erick Alley, DO  Primary Care Provider: Ivonne Andrew, NP Consultants: Cardiology  Indication for Hospitalization: post-syncopal episode  Discharge Diagnoses/Problem List:  Principal Problem for Admission: Syncope and Collapse Other Problems addressed during stay:  Principal Problem:   Syncope and collapse Active Problems:   Dementia (HCC)   HOCM (hypertrophic obstructive cardiomyopathy) [facility])   Syncope  Brief Hospital Course:  Katelyn Sparks is a 88 y.o.female with a history of dementia, osteoarthrosis, and GERD who was admitted to the [facility] Medicine Teaching Service at [facility] post-syncopal episode. Her hospital course is detailed below:  Syncope  Vasovagal  HOCM Admitted initially for workup of the syncopal episode that happened while getting ready to take a shower. Patient vitals remained stable throughout admission. CBC, VBG, and CMP largely unremarkable. Continued to trend troponins until flattening on 9/12, likely elevated secondary to hypovolemia. Orthostatic vitals revealed no evidence of orthostatic hypotension. Head CT w/o contrast revealed no intracranial pathology. Echocardiogram revealed findings consistent with hypertrophic obstructive cardiomyopathy and grade I diastolic dysfunction. Cardiology consulted who recommended starting metoprolol 12.5mg  BID, pt not a candidate for procedures. Syncope likely a vasovagal event. Transitioned to Metoprolol Succinate 25 mg daily upon discharge.  Dementia Family reports mental decline over the last few years with increased deterioration in the last few months. Required seroquel and haldol at night throughout admission for agitation/delirium. Goals of care discussion occurred  9/15 with family, palliative care, and primary team. Family chose to keep the patient full code and pt to be discharged home with home health PT.  Other chronic conditions were medically managed with home medications and formulary alternatives as necessary:  PCP Follow-up Recommendations: Cardiology follow-up  Continued conversation regarding goals of care  Disposition: Home with Home Health PT/OT  Discharge Condition: Stable  Discharge Exam:  Vitals:   08/28/23 0949 08/28/23 1131  BP: (!) 142/71 120/68  Pulse: 64 64  Resp: 17 18  Temp: 97.7 F (36.5 C) (!) 97.5 F (36.4 C)  SpO2:  100%   General: NAD, awake and alert HEENT: Normocephalic, atraumatic. Conjunctiva normal. No nasal discharge Cardiovascular: RRR. No M/R/G Respiratory: CTAB, normal WOB on RA. No wheezing, crackles, rhonchi, or diminished breath sounds. Abdomen: Soft, non-tender, non-distended. Bowel sounds normoactive Extremities: No BLE edema, no deformities or significant joint findings. Skin: Warm and dry.  Significant Procedures: none  Significant Labs and Imaging: none  Results/Tests Pending at Time of Discharge: none  Discharge Medications:  Allergies as of 08/28/2023   No Known Allergies      Medication List     TAKE these medications    memantine 28 MG Cp24 24 hr capsule Commonly known as: NAMENDA XR TAKE ONE CAPSULE BY MOUTH DAILY   metoprolol succinate 25 MG 24 hr tablet Commonly known as: Toprol XL Take 1 tablet (25 mg total) by mouth daily.        Discharge Instructions: Please refer to Patient Instructions section of EMR for full details.  Patient was counseled important signs and symptoms that should prompt return to medical care, changes in medications, dietary instructions, activity restrictions, and follow up appointments.   Follow-Up Appointments:  Follow-up Information     [facility] Follow up.   Specialty: Home Health Services Why: Frances Furbish will call you in  the next 24-48 hours to set up services. Contact information: [address] [place] [address] [phone]         Tereso Newcomer T, PA-C Follow up.   Specialties: Cardiology, Physician Assistant Why: Cardiology Hospital Follow-up on 09/19/2023 at 2:20 PM. Contact information: [address] [place] [address] [phone]                 Lincoln Brigham, MD 08/28/2023, 12:55 PM PGY-1, [facility]

## 2023-08-28 NOTE — Plan of Care (Signed)
Problem: Clinical Measurements: Goal: Ability to maintain clinical measurements within normal limits will improve Outcome: Progressing Goal: Will remain free from infection Outcome: Progressing Goal: Diagnostic test results will improve Outcome: Progressing Goal: Respiratory complications will improve Outcome: Progressing Goal: Cardiovascular complication will be avoided Outcome: Progressing   Problem: Activity: Goal: Risk for activity intolerance will decrease Outcome: Progressing   Problem: Nutrition: Goal: Adequate nutrition will be maintained Outcome: Progressing   Problem: Elimination: Goal: Will not experience complications related to bowel motility Outcome: Progressing Goal: Will not experience complications related to urinary retention Outcome: Progressing   Problem: Pain Managment: Goal: General experience of comfort will improve Outcome: Progressing   Problem: Skin Integrity: Goal: Risk for impaired skin integrity will decrease Outcome: Progressing

## 2023-08-28 NOTE — Assessment & Plan Note (Addendum)
No further syncope events. - Cardiology consulted, appreciate recommendations and will follow up outpatient. - Fall precautions in place - PT/OT consulted and recommended home health PT/OT

## 2023-09-01 ENCOUNTER — Other Ambulatory Visit: Payer: Medicare Other

## 2023-09-01 ENCOUNTER — Ambulatory Visit (INDEPENDENT_AMBULATORY_CARE_PROVIDER_SITE_OTHER): Payer: Medicare Other | Admitting: Nurse Practitioner

## 2023-09-01 ENCOUNTER — Encounter: Payer: Self-pay | Admitting: Nurse Practitioner

## 2023-09-01 VITALS — BP 130/64 | HR 52 | Temp 98.7°F | Ht 64.0 in | Wt 126.0 lb

## 2023-09-01 DIAGNOSIS — E876 Hypokalemia: Secondary | ICD-10-CM | POA: Diagnosis not present

## 2023-09-01 DIAGNOSIS — F0390 Unspecified dementia without behavioral disturbance: Secondary | ICD-10-CM

## 2023-09-01 DIAGNOSIS — Z09 Encounter for follow-up examination after completed treatment for conditions other than malignant neoplasm: Secondary | ICD-10-CM | POA: Diagnosis not present

## 2023-09-01 DIAGNOSIS — R55 Syncope and collapse: Secondary | ICD-10-CM | POA: Diagnosis not present

## 2023-09-01 DIAGNOSIS — G4701 Insomnia due to medical condition: Secondary | ICD-10-CM

## 2023-09-01 LAB — POCT URINALYSIS DIP (CLINITEK)
Bilirubin, UA: NEGATIVE
Blood, UA: NEGATIVE
Glucose, UA: NEGATIVE mg/dL
Ketones, POC UA: NEGATIVE mg/dL
Nitrite, UA: NEGATIVE
Spec Grav, UA: 1.025 (ref 1.010–1.025)
Urobilinogen, UA: 0.2 E.U./dL
pH, UA: 6.5 (ref 5.0–8.0)

## 2023-09-01 MED ORDER — METOPROLOL SUCCINATE ER 25 MG PO TB24: 25.0000 mg | ORAL_TABLET | Freq: Every day | ORAL | 1 refills | Status: DC

## 2023-09-01 MED ORDER — MEMANTINE HCL ER 28 MG PO CP24
28.0000 mg | ORAL_CAPSULE | Freq: Every day | ORAL | 1 refills | Status: DC
Start: 2023-09-01 — End: 2023-12-29

## 2023-09-01 NOTE — Assessment & Plan Note (Signed)
Hospital discharge summary, labs and imaging studies reviewed

## 2023-09-01 NOTE — Assessment & Plan Note (Addendum)
Continue Namenda 28 mg daily UA was negative for urinary tract infection I discussed with the patient's son  that dementia is a progressive disease, we will refer to neurology.

## 2023-09-01 NOTE — Progress Notes (Signed)
Established Patient Office Visit  Subjective:  Patient ID: Katelyn Sparks, female    DOB: 24-Nov-1935  Age: 88 y.o. MRN: [id number]  CC:  Chief Complaint  Patient presents with   Medical Management of Chronic Issues    HPI Katelyn Sparks is a 88 y.o. female  has a past medical history of Blood in stool, Closed dislocation of left shoulder (03/2019), Dementia (HCC), Esophageal reflux, Flatulence, eructation, and gas pain, Osteoarthrosis, unspecified whether generalized or localized, unspecified site, and Unspecified hemorrhoids without mention of complication.  Patient presents for hospital discharge follow-up Patient was on admission at the hospital from 08/23/2023 to 08/28/2023 for syncope and collapse.  Patient is accompanied by her son, patient lives at home with her grandson.  Syncope/vasovagal.  Patient was started on metoprolol 12.5 mg twice daily by cardiology while at the hospital and transition to metoprolol succinate 25 mg daily upon discharge.  They have upcoming appointment with cardiology in October  Dementia.  Takes Namenda 28 mg daily.  Patient's son stated that the patient has been deteriorating recently.  The palliative care team did meet with the family during her last hospitalization.  The patient's son  stated that he has paperwork from them  that he hopes to complete and return.  Patient is alert and oriented to self.  No form of distress noted today noted.  The patient saw thanks the patient may have a urinary tract infection but they denied fever, abdominal pain dysuria.    Home health PT OT was ordered at discharge, patient sounds stated that they have an appointment with physical therapy today.      Past Medical History:  Diagnosis Date   Blood in stool    Closed dislocation of left shoulder 03/2019   Dementia (HCC)    Esophageal reflux    Flatulence, eructation, and gas pain    Osteoarthrosis, unspecified whether generalized or localized, unspecified site     Unspecified hemorrhoids without mention of complication     Past Surgical History:  Procedure Laterality Date   ABDOMINAL HYSTERECTOMY     COLONOSCOPY  11/2010   UPPER GI ENDOSCOPY  11/2010    Family History  Problem Relation Age of Onset   Hypertension Mother    Diabetes Mother    Diabetes Sister    Diabetes Brother    Diabetes Other    Colon cancer Neg Hx    Cancer Neg Hx    Early death Neg Hx    Heart disease Neg Hx    Hyperlipidemia Neg Hx    Kidney disease Neg Hx    Learning disabilities Neg Hx    Stroke Neg Hx     Social History   Socioeconomic History   Marital status: Widowed    Spouse name: Not on file   Number of children: 2   Years of education: 68   Highest education level: Not on file  Occupational History   Occupation: Retired    Associate Professor: RETIRED  Tobacco Use   Smoking status: Never   Smokeless tobacco: Never  Vaping Use   Vaping status: Never Used  Substance and Sexual Activity   Alcohol use: No    Alcohol/week: 0.0 standard drinks of alcohol   Drug use: No   Sexual activity: Not Currently  Other Topics Concern   Not on file  Social History Narrative   She is a retired from a tobacco company.   Her husband passed away in November 15, 2000.  She has two  son.   Patient is right handed.   Her grandson lives with her.   Patient drinks 1 cup of caffeine daily   Social Determinants of Health   Financial Resource Strain: Not on file  Food Insecurity: No Food Insecurity (08/25/2023)   Hunger Vital Sign    Worried About Running Out of Food in the Last Year: Never true    Ran Out of Food in the Last Year: Never true  Transportation Needs: No Transportation Needs (08/25/2023)   PRAPARE - Administrator, Civil Service (Medical): No    Lack of Transportation (Non-Medical): No  Physical Activity: Not on file  Stress: Not on file  Social Connections: Not on file  Intimate Partner Violence: Not At Risk (08/25/2023)   Humiliation, Afraid, Rape, and  Kick questionnaire    Fear of Current or Ex-Partner: No    Emotionally Abused: No    Physically Abused: No    Sexually Abused: No    Outpatient Medications Prior to Visit  Medication Sig Dispense Refill   traZODone (DESYREL) 50 MG tablet Take 50 mg by mouth at bedtime.     memantine (NAMENDA XR) 28 MG CP24 24 hr capsule TAKE ONE CAPSULE BY MOUTH DAILY 90 capsule 3   metoprolol succinate (TOPROL XL) 25 MG 24 hr tablet Take 1 tablet (25 mg total) by mouth daily. 30 tablet 0   No facility-administered medications prior to visit.    No Known Allergies  ROS Review of Systems  Constitutional:  Negative for activity change, appetite change, fatigue and fever.  HENT:  Negative for congestion, ear discharge and ear pain.   Eyes:  Negative for pain, discharge, redness and itching.  Respiratory:  Negative for cough, chest tightness, shortness of breath and wheezing.   Cardiovascular:  Negative for chest pain, palpitations and leg swelling.  Gastrointestinal:  Negative for abdominal distention, abdominal pain, anal bleeding, constipation and diarrhea.  Endocrine: Negative for cold intolerance.  Genitourinary:  Negative for difficulty urinating, dysuria, flank pain, frequency, hematuria, menstrual problem, pelvic pain and vaginal bleeding.  Musculoskeletal:  Negative for arthralgias, back pain, gait problem, joint swelling and myalgias.  Skin:  Negative for color change, pallor, rash and wound.  Allergic/Immunologic: Negative for environmental allergies.  Neurological:  Negative for dizziness, tremors, facial asymmetry, weakness and headaches.  Hematological:  Negative for adenopathy. Does not bruise/bleed easily.  Psychiatric/Behavioral:  Positive for confusion. Negative for agitation, decreased concentration, hallucinations, self-injury and suicidal ideas.       Objective:    Physical Exam Vitals and nursing note reviewed.  Constitutional:      General: She is not in acute distress.     Appearance: Normal appearance. She is not ill-appearing, toxic-appearing or diaphoretic.  HENT:     Mouth/Throat:     Mouth: Mucous membranes are moist.     Pharynx: Oropharynx is clear. No oropharyngeal exudate or posterior oropharyngeal erythema.  Eyes:     General: No scleral icterus.       Right eye: No discharge.        Left eye: No discharge.     Extraocular Movements: Extraocular movements intact.     Conjunctiva/sclera: Conjunctivae normal.  Cardiovascular:     Rate and Rhythm: Normal rate and regular rhythm.     Pulses: Normal pulses.     Heart sounds: Normal heart sounds. No murmur heard.    No friction rub. No gallop.  Pulmonary:     Effort: Pulmonary effort is normal. No  respiratory distress.     Breath sounds: Normal breath sounds. No stridor. No wheezing, rhonchi or rales.  Chest:     Chest wall: No tenderness.  Abdominal:     General: There is no distension.     Palpations: Abdomen is soft.     Tenderness: There is no abdominal tenderness. There is no right CVA tenderness, left CVA tenderness or guarding.  Musculoskeletal:        General: No swelling, tenderness, deformity or signs of injury.     Right lower leg: No edema.     Left lower leg: No edema.  Skin:    General: Skin is warm and dry.     Capillary Refill: Capillary refill takes less than 2 seconds.     Coloration: Skin is not jaundiced or pale.     Findings: No bruising, erythema or lesion.  Neurological:     Mental Status: She is alert. Mental status is at baseline.     Motor: No weakness.     Coordination: Coordination normal.     Gait: Gait normal.     Comments: Patient sitting comfortably in a chair.  Alert and oriented to self.  No sign of distress noted  Psychiatric:        Mood and Affect: Mood normal.        Behavior: Behavior normal.        Thought Content: Thought content normal.        Judgment: Judgment normal.     BP 130/64   Pulse (!) 52   Temp 98.7 F (37.1 C) (Oral)   Ht 5'  4" (1.626 m)   Wt 126 lb (57.2 kg)   SpO2 100%   BMI 21.63 kg/m  Wt Readings from Last 3 Encounters:  09/01/23 126 lb (57.2 kg)  08/25/23 124 lb 1.9 oz (56.3 kg)  04/20/22 132 lb 9.6 oz (60.1 kg)    Lab Results  Component Value Date   TSH 1.070 11/24/2020   Lab Results  Component Value Date   WBC 4.8 08/25/2023   HGB 11.1 (L) 08/25/2023   HCT 30.8 (L) 08/25/2023   MCV 84.4 08/25/2023   PLT 152 08/25/2023   Lab Results  Component Value Date   NA 140 08/25/2023   K 3.4 (L) 08/25/2023   CO2 22 08/25/2023   GLUCOSE 96 08/25/2023   BUN 10 08/25/2023   CREATININE 0.77 08/25/2023   BILITOT 0.4 04/20/2022   ALKPHOS 99 04/20/2022   AST 19 04/20/2022   ALT 13 04/20/2022   PROT 7.7 04/20/2022   ALBUMIN 4.5 04/20/2022   CALCIUM 8.5 (L) 08/25/2023   ANIONGAP 8 08/25/2023   EGFR 61 04/20/2022   GFR 79.53 03/06/2017   Lab Results  Component Value Date   CHOL 306 (H) 05/26/2021   Lab Results  Component Value Date   HDL 72 05/26/2021   Lab Results  Component Value Date   LDLCALC 198 (H) 05/26/2021   Lab Results  Component Value Date   TRIG 192 (H) 05/26/2021   Lab Results  Component Value Date   CHOLHDL 4.3 05/26/2021   Lab Results  Component Value Date   HGBA1C 5.8 (A) 10/01/2019      Assessment & Plan:   Problem List Items Addressed This Visit       Nervous and Auditory   Dementia (HCC)    Continue Namenda 28 mg daily UA was negative for urinary tract infection I discussed with the patient's son  that  dementia is a progressive disease, we will refer to neurology.       Relevant Medications   traZODone (DESYREL) 50 MG tablet   memantine (NAMENDA XR) 28 MG CP24 24 hr capsule   Other Relevant Orders   POCT URINALYSIS DIP (CLINITEK) (Completed)   Ambulatory referral to Neurology     Other   Insomnia due to medical condition    Continue trazodone 50 mg at bedtime      Syncope and collapse - Primary    Encouraged to keep upcoming appointment  with cardiology and continue metoprolol 25 mg daily PT OT will be following the patient at home      Hospital discharge follow-up    Hospital discharge summary, labs and imaging studies reviewed      Hypokalemia    Lab Results  Component Value Date   NA 140 08/25/2023   K 3.4 (L) 08/25/2023   CO2 22 08/25/2023   GLUCOSE 96 08/25/2023   BUN 10 08/25/2023   CREATININE 0.77 08/25/2023   CALCIUM 8.5 (L) 08/25/2023   GFR 79.53 03/06/2017   EGFR 61 04/20/2022   GFRNONAA >60 08/25/2023  Rechecking potassium level today      Relevant Orders   Potassium    Meds ordered this encounter  Medications   metoprolol succinate (TOPROL XL) 25 MG 24 hr tablet    Sig: Take 1 tablet (25 mg total) by mouth daily.    Dispense:  90 tablet    Refill:  1   memantine (NAMENDA XR) 28 MG CP24 24 hr capsule    Sig: Take 1 capsule (28 mg total) by mouth daily.    Dispense:  90 capsule    Refill:  1    Follow-up: Return in about 3 months (around 12/01/2023) for HTN.    Donell Beers, FNP

## 2023-09-01 NOTE — Assessment & Plan Note (Signed)
Encouraged to keep upcoming appointment with cardiology and continue metoprolol 25 mg daily PT OT will be following the patient at home

## 2023-09-01 NOTE — Progress Notes (Signed)
Patient asking for referral to dentist to rule out infection.

## 2023-09-01 NOTE — Assessment & Plan Note (Addendum)
Lab Results  Component Value Date   NA 140 08/25/2023   K 3.4 (L) 08/25/2023   CO2 22 08/25/2023   GLUCOSE 96 08/25/2023   BUN 10 08/25/2023   CREATININE 0.77 08/25/2023   CALCIUM 8.5 (L) 08/25/2023   GFR 79.53 03/06/2017   EGFR 61 04/20/2022   GFRNONAA >60 08/25/2023  Rechecking potassium level today

## 2023-09-01 NOTE — Assessment & Plan Note (Signed)
Continue trazodone 50 mg at bedtime.

## 2023-09-01 NOTE — Patient Instructions (Signed)
Dementia without behavioral disturbance (HCC)  - Ambulatory referral to Neurology  It is important that you exercise regularly at least 30 minutes 5 times a week as tolerated  Think about what you will eat, plan ahead. Choose " clean, green, fresh or frozen" over canned, processed or packaged foods which are more sugary, salty and fatty. 70 to 75% of food eaten should be vegetables and fruit. Three meals at set times with snacks allowed between meals, but they must be fruit or vegetables. Aim to eat over a 12 hour period , example 7 am to 7 pm, and STOP after  your last meal of the day. Drink water,generally about 64 ounces per day, no other drink is as healthy. Fruit juice is best enjoyed in a healthy way, by EATING the fruit.  Thanks for choosing [facility] we consider it a privelige to serve you.

## 2023-09-02 LAB — POTASSIUM: Potassium: 4.6 mmol/L (ref 3.5–5.2)

## 2023-09-04 ENCOUNTER — Telehealth: Payer: Self-pay | Admitting: Nurse Practitioner

## 2023-09-04 NOTE — Telephone Encounter (Signed)
Amy Malen Gauze - Physical therapist with [facility] LVM on 09/01/23 @ 3:14pm stating she completed an assesment home health visit for physical therapy with the patient on 9/20.  She did not admit her for services due to her cognitive status and limited ability to participate and follow instructions.  Phone [phone]

## 2023-09-19 ENCOUNTER — Ambulatory Visit: Payer: Medicare Other | Attending: Physician Assistant | Admitting: Physician Assistant

## 2023-09-19 NOTE — Progress Notes (Deleted)
  Cardiology Office Note:    Date:  09/19/2023  ID:  EMMER LILLIBRIDGE, DOB 1935-08-21, MRN [id number] PCP: Ivonne Andrew, NP   HeartCare Providers Cardiologist:  None { Click to update primary MD,subspecialty MD or APP then REFRESH:1}    {Click to Open Review  :1}   Patient Profile:      Hypertrophic Obstructive Cardiomyopathy  TTE 08/24/2023: EF 65-70, no RWMA, moderate asymmetric LVH, peak LVOT gradient 42 mmHg, GR 1 DD, normal RVSF, normal PASP, RVSP 33.4, + SAM, mild MR, mild AV calcification, RAP 8 Hyperlipidemia   Dementia  GERD      {      :1}   History of Present Illness:  Discussed the use of AI scribe software for clinical note transcription with the patient, who gave verbal consent to proceed.  OTELIA HETTINGER is a 88 y.o. female who returns for posthospitalization follow-up.  She was admitted 9/11-9/16 for syncope.  Echocardiogram demonstrated asymmetric LVH, systolic anterior motion of the mitral valve leaflet, LVOT gradient 42 mmHg consistent with hypertrophic obstructive cardiomyopathy.  She was seen by cardiology.  Given her advanced dementia, she is not felt to be a candidate for ICD, advanced therapies.  She was placed on beta-blocker therapy.  Caution to avoid dehydration was also stressed.          ROS   See HPI ***    Studies Reviewed:       *** Risk Assessment/Calculations:   {Does this patient have ATRIAL FIBRILLATION?:[phone]} No BP recorded.  {Refresh Note OR Click here to enter BP  :1}***       Physical Exam:   VS:  There were no vitals taken for this visit.   Wt Readings from Last 3 Encounters:  09/01/23 126 lb (57.2 kg)  08/25/23 124 lb 1.9 oz (56.3 kg)  04/20/22 132 lb 9.6 oz (60.1 kg)    Physical Exam***     Assessment and Plan:   Assessment & Plan HOCM (hypertrophic obstructive cardiomyopathy) (HCC)   Assessment and Plan             {      :1}    {Are you ordering a CV Procedure (e.g. stress test, cath, DCCV, TEE, etc)?    Press F2        :[id number]}  Dispo:  No follow-ups on file.  Signed, Tereso Newcomer, PA-C

## 2023-12-01 ENCOUNTER — Other Ambulatory Visit: Payer: Self-pay

## 2023-12-01 ENCOUNTER — Ambulatory Visit: Payer: Self-pay | Admitting: Nurse Practitioner

## 2023-12-03 ENCOUNTER — Other Ambulatory Visit: Payer: Self-pay

## 2023-12-03 ENCOUNTER — Emergency Department (HOSPITAL_COMMUNITY)
Admission: EM | Admit: 2023-12-03 | Discharge: 2023-12-03 | Disposition: A | Discharge status: Home / Self Care | Payer: Medicare Other | Attending: Emergency Medicine | Admitting: Emergency Medicine

## 2023-12-03 ENCOUNTER — Encounter (HOSPITAL_COMMUNITY): Payer: Self-pay | Admitting: Emergency Medicine

## 2023-12-03 ENCOUNTER — Emergency Department (HOSPITAL_COMMUNITY): Payer: Medicare Other

## 2023-12-03 DIAGNOSIS — F0390 Unspecified dementia without behavioral disturbance: Secondary | ICD-10-CM | POA: Diagnosis not present

## 2023-12-03 DIAGNOSIS — R030 Elevated blood-pressure reading, without diagnosis of hypertension: Secondary | ICD-10-CM | POA: Diagnosis not present

## 2023-12-03 DIAGNOSIS — K5909 Other constipation: Secondary | ICD-10-CM | POA: Insufficient documentation

## 2023-12-03 DIAGNOSIS — Z79899 Other long term (current) drug therapy: Secondary | ICD-10-CM | POA: Insufficient documentation

## 2023-12-03 DIAGNOSIS — K5900 Constipation, unspecified: Secondary | ICD-10-CM | POA: Diagnosis present

## 2023-12-03 LAB — COMPREHENSIVE METABOLIC PANEL
ALT: 20 U/L (ref 0–44)
AST: 30 U/L (ref 15–41)
Albumin: 4 g/dL (ref 3.5–5.0)
Alkaline Phosphatase: 92 U/L (ref 38–126)
Anion gap: 12 (ref 5–15)
BUN: 16 mg/dL (ref 8–23)
CO2: 24 mmol/L (ref 22–32)
Calcium: 9.4 mg/dL (ref 8.9–10.3)
Chloride: 104 mmol/L (ref 98–111)
Creatinine, Ser: 0.85 mg/dL (ref 0.44–1.00)
GFR, Estimated: >60 mL/min (ref >60)
Glucose, Bld: 118 mg/dL — ABNORMAL HIGH (ref 70–99)
Potassium: 3.7 mmol/L (ref 3.5–5.1)
Sodium: 140 mmol/L (ref 135–145)
Total Bilirubin: 0.7 mg/dL (ref <1.2)
Total Protein: 7.9 g/dL (ref 6.5–8.1)

## 2023-12-03 LAB — CBC WITH DIFFERENTIAL/PLATELET
Abs Immature Granulocytes: 0.01 10*3/uL (ref 0.00–0.07)
Basophils Absolute: 0 10*3/uL (ref 0.0–0.1)
Basophils Relative: 0 %
Eosinophils Absolute: 0 10*3/uL (ref 0.0–0.5)
Eosinophils Relative: 0 %
HCT: 39.3 % (ref 36.0–46.0)
Hemoglobin: 13.8 g/dL (ref 12.0–15.0)
Immature Granulocytes: 0 %
Lymphocytes Relative: 15 %
Lymphs Abs: 0.8 10*3/uL (ref 0.7–4.0)
MCH: 30.2 pg (ref 26.0–34.0)
MCHC: 35.1 g/dL (ref 30.0–36.0)
MCV: 86 fL (ref 80.0–100.0)
Monocytes Absolute: 0.2 10*3/uL (ref 0.1–1.0)
Monocytes Relative: 4 %
Neutro Abs: 4.4 10*3/uL (ref 1.7–7.7)
Neutrophils Relative %: 81 %
Platelets: 190 10*3/uL (ref 150–400)
RBC: 4.57 MIL/uL (ref 3.87–5.11)
RDW: 14.1 % (ref 11.5–15.5)
WBC: 5.5 10*3/uL (ref 4.0–10.5)
nRBC: 0 % (ref 0.0–0.2)

## 2023-12-03 LAB — LIPASE, BLOOD: Lipase: 27 U/L (ref 11–51)

## 2023-12-03 MED ORDER — DOCUSATE SODIUM 100 MG PO CAPS: 100.0000 mg | ORAL_CAPSULE | Freq: Two times a day (BID) | ORAL | 0 refills | Status: AC

## 2023-12-03 MED ORDER — POLYETHYLENE GLYCOL 3350 17 G PO PACK: 17.0000 g | PACK | Freq: Every day | ORAL | 0 refills | Status: AC | PRN

## 2023-12-03 NOTE — ED Provider Triage Note (Signed)
Emergency Medicine Provider Triage Evaluation Note  Katelyn Sparks , a 88 y.o. female  was evaluated in triage.  Pt complains of constipation.  Patient sons report patient has been complaining of not having a bowel movement for the past several days despite trying to go to the bathroom often.  No report of nausea vomiting or fever.  Patient does have significant history of dementia.  Abdominal surgeries include abdominal hysterectomy in the past.  Review of Systems  Positive: As above Negative: As above  Physical Exam  BP (!) 152/78   Pulse 72   Temp 98.2 F (36.8 C)   Resp 18   Ht 5\' 4"  (1.626 m)   Wt 57 kg   SpO2 100%   BMI 21.57 kg/m  Gen:   Awake, no distress   Resp:  Normal effort  MSK:   Moves extremities without difficulty  Other:    Medical Decision Making  Medically screening exam initiated at 12:35 PM.  Appropriate orders placed.  ZYNIA BORGMAN was informed that the remainder of the evaluation will be completed by another provider, this initial triage assessment does not replace that evaluation, and the importance of remaining in the ED until their evaluation is complete.     Fayrene Helper, PA-C 12/03/23 1237

## 2023-12-03 NOTE — Discharge Instructions (Addendum)
It was our pleasure to provide your ER care today - we hope that you feel better.  Drink plenty of fluids/stay well hydrated. Get adequate fiber in diet. You may take colace (stool softener) 2x/day, and miralax (laxative) once per day as need - these medications are also available over the counter.   Follow up with primary care doctor in the coming week. Also have your blood pressure recheck then, as it is mildly high today.   Return to ER if worse, new symptoms, fevers, new or worsening or severe abdominal pain, persistent vomiting, or other concern.

## 2023-12-03 NOTE — ED Provider Notes (Addendum)
[place] EMERGENCY DEPARTMENT AT [facility] Provider Note   CSN: [id number] Arrival date & time: 12/03/23  1130     History  Chief Complaint  Patient presents with  . Constipation    Katelyn Sparks is a 88 y.o. female.  Pt with hx dementia, presenting with urge to have bm, with recurrent trips to bathroom at home without results.  Pt has been eating and drinking. No vomiting. No fevers. Pt limited historian, dementia, level 5 caveat.  Son visiting from [place], arrived yesterday.  No clear history on recent normal pattern of bowel movements. While in ED patient was able to have large bm and no current c/o of any pain.   The history is provided by the patient, medical records and a relative. The history is limited by the condition of the patient.  Constipation Associated symptoms: no abdominal pain, no back pain, no dysuria, no fever and no vomiting        Home Medications Prior to Admission medications   Medication Sig Start Date End Date Taking? Authorizing Provider  docusate sodium (COLACE) 100 MG capsule Take 1 capsule (100 mg total) by mouth every 12 (twelve) hours. 12/03/23  Yes Cathren Laine, MD  polyethylene glycol (MIRALAX) 17 g packet Take 17 g by mouth daily as needed. 12/03/23  Yes Cathren Laine, MD  memantine (NAMENDA XR) 28 MG CP24 24 hr capsule Take 1 capsule (28 mg total) by mouth daily. 09/01/23   Donell Beers, FNP  metoprolol succinate (TOPROL XL) 25 MG 24 hr tablet Take 1 tablet (25 mg total) by mouth daily. 09/01/23   Donell Beers, FNP  traZODone (DESYREL) 50 MG tablet Take 50 mg by mouth at bedtime.    [provider]  omeprazole (PRILOSEC) 40 MG capsule Take 1 capsule (40 mg total) by mouth daily. Patient not taking: Reported on 12/10/2019 10/17/18 05/31/20  Kallie Locks, FNP      Allergies    Patient has no known allergies.    Review of Systems   Review of Systems  Constitutional:  Negative for fever.  HENT:  Negative  for sore throat.   Respiratory:  Negative for cough.   Cardiovascular:  Negative for chest pain.  Gastrointestinal:  Positive for constipation. Negative for abdominal pain and vomiting.  Genitourinary:  Negative for dysuria and flank pain.  Musculoskeletal:  Negative for back pain.  Neurological:  Negative for headaches.    Physical Exam Updated Vital Signs BP 138/83 (BP Location: Right Arm)   Pulse 77   Temp 97.9 F (36.6 C) (Oral)   Resp 16   Ht 1.626 m (5\' 4" )   Wt 57 kg   SpO2 98%   BMI 21.57 kg/m  Physical Exam Vitals and nursing note reviewed.  Constitutional:      Appearance: Normal appearance. She is well-developed.  HENT:     Head: Atraumatic.     Nose: Nose normal.     Mouth/Throat:     Mouth: Mucous membranes are moist.     Pharynx: Oropharynx is clear. No oropharyngeal exudate or posterior oropharyngeal erythema.  Eyes:     General: No scleral icterus.    Conjunctiva/sclera: Conjunctivae normal.     Pupils: Pupils are equal, round, and reactive to light.  Neck:     Trachea: No tracheal deviation.  Cardiovascular:     Rate and Rhythm: Normal rate and regular rhythm.     Pulses: Normal pulses.     Heart sounds:  Normal heart sounds. No murmur heard.    No friction rub. No gallop.  Pulmonary:     Effort: Pulmonary effort is normal. No respiratory distress.     Breath sounds: Normal breath sounds.  Abdominal:     General: Bowel sounds are normal. There is no distension.     Palpations: Abdomen is soft. There is no mass.     Tenderness: There is no abdominal tenderness. There is no guarding or rebound.     Hernia: No hernia is present.  Genitourinary:    Comments: No cva tenderness.  Musculoskeletal:        General: No swelling or tenderness.     Cervical back: Normal range of motion and neck supple. No rigidity. No muscular tenderness.     Right lower leg: No edema.     Left lower leg: No edema.  Skin:    General: Skin is warm and dry.     Findings:  No rash.  Neurological:     Mental Status: She is alert.     Comments: Alert, speech normal. Motor/sens grossly intact. Steady gait.   Psychiatric:        Mood and Affect: Mood normal.    ED Results / Procedures / Treatments   Labs (all labs ordered are listed, but only abnormal results are displayed) Results for orders placed or performed during the hospital encounter of 12/03/23  CBC with Differential   Collection Time: 12/03/23 12:53 PM  Result Value Ref Range   WBC 5.5 4.0 - 10.5 K/uL   RBC 4.57 3.87 - 5.11 MIL/uL   Hemoglobin 13.8 12.0 - 15.0 g/dL   HCT 16.1 09.6 - 04.5 %   MCV 86.0 80.0 - 100.0 fL   MCH 30.2 26.0 - 34.0 pg   MCHC 35.1 30.0 - 36.0 g/dL   RDW 40.9 81.1 - 91.4 %   Platelets 190 150 - 400 K/uL   nRBC 0.0 0.0 - 0.2 %   Neutrophils Relative % 81 %   Neutro Abs 4.4 1.7 - 7.7 K/uL   Lymphocytes Relative 15 %   Lymphs Abs 0.8 0.7 - 4.0 K/uL   Monocytes Relative 4 %   Monocytes Absolute 0.2 0.1 - 1.0 K/uL   Eosinophils Relative 0 %   Eosinophils Absolute 0.0 0.0 - 0.5 K/uL   Basophils Relative 0 %   Basophils Absolute 0.0 0.0 - 0.1 K/uL   Immature Granulocytes 0 %   Abs Immature Granulocytes 0.01 0.00 - 0.07 K/uL  Comprehensive metabolic panel   Collection Time: 12/03/23  3:42 PM  Result Value Ref Range   Sodium 140 135 - 145 mmol/L   Potassium 3.7 3.5 - 5.1 mmol/L   Chloride 104 98 - 111 mmol/L   CO2 24 22 - 32 mmol/L   Glucose, Bld 118 (H) 70 - 99 mg/dL   BUN 16 8 - 23 mg/dL   Creatinine, Ser 7.82 0.44 - 1.00 mg/dL   Calcium 9.4 8.9 - 95.6 mg/dL   Total Protein 7.9 6.5 - 8.1 g/dL   Albumin 4.0 3.5 - 5.0 g/dL   AST 30 15 - 41 U/L   ALT 20 0 - 44 U/L   Alkaline Phosphatase 92 38 - 126 U/L   Total Bilirubin 0.7 <1.2 mg/dL   GFR, Estimated >21 >30 mL/min   Anion gap 12 5 - 15  Lipase, blood   Collection Time: 12/03/23  3:42 PM  Result Value Ref Range   Lipase 27 11 - 51  U/L    EKG None  Radiology DG ABD ACUTE 2+V W 1V CHEST Result Date:  12/03/2023 CLINICAL DATA:  Constipation. EXAM: DG ABDOMEN ACUTE WITH 1 VIEW CHEST COMPARISON:  Chest x-ray 01/10/2020 and abdominal films 10/30/2013 FINDINGS: Lungs are adequately inflated without focal airspace consolidation or effusion. Cardiomediastinal silhouette and remainder of the chest is unchanged. No free peritoneal air. Abdominopelvic images demonstrate air and stool throughout the colon with moderate fecal retention over the left colon and rectosigmoid colon. Few colonic air-fluid levels over the right abdomen no dilated small bowel loops or free peritoneal air. Degenerative change of the spine and hips. IMPRESSION: 1. No acute cardiopulmonary disease. 2. Nonobstructive bowel gas pattern with moderate fecal retention over the left colon and rectosigmoid colon. Electronically Signed   By: Elberta Fortis M.D.   On: 12/03/2023 14:29    Procedures Procedures    Medications Ordered in ED Medications - No data to display  ED Course/ Medical Decision Making/ A&P                                 Medical Decision Making Problems Addressed: Elevated blood pressure reading: acute illness or injury Other constipation: acute illness or injury with systemic symptoms  Amount and/or Complexity of Data Reviewed Independent Historian:     Details: Family, hx External Data Reviewed: notes. Labs: ordered. Decision-making details documented in ED Course. Radiology: ordered and independent interpretation performed. Decision-making details documented in ED Course.  Risk OTC drugs. Decision regarding hospitalization.   Iv ns. Continuous pulse ox and cardiac monitoring. Labs ordered/sent. Imaging ordered.   Differential diagnosis includes sbo, dehydration, constipation, etc. Dispo decision including potential need for admission considered - will get labs and imaging and reassess.   Reviewed nursing notes and prior charts for additional history. External reports reviewed. Additional history from:  family.   Cardiac monitor: sinus rhythm, rate 72.  Labs reviewed/interpreted by me - wbc and hgb normal. Chem normal.   Xrays reviewed/interpreted by me - no sbo.   Pt had large bm in ED, no current c/o. Po fluids. No emesis. No dysuria or gu c/o. No fevers.   Pt appears stable for d/c. Rec colace/miralax.   Rec close pcp f/u.  Return precautions provided.          Final Clinical Impression(s) / ED Diagnoses Final diagnoses:  Other constipation  Elevated blood pressure reading    Rx / DC Orders ED Discharge Orders          Ordered    docusate sodium (COLACE) 100 MG capsule  Every 12 hours        12/03/23 1511    polyethylene glycol (MIRALAX) 17 g packet  Daily PRN        12/03/23 1511               Cathren Laine, MD 12/03/23 1817

## 2023-12-03 NOTE — ED Triage Notes (Signed)
Per patient son pt hasn't had BM in several days and has complaining of having to go to the bathroom constantly. Son requesting scan to see if she is backed up.

## 2023-12-03 NOTE — ED Notes (Signed)
Pt son in triage states pt had somewhat large bm while in triage

## 2023-12-21 ENCOUNTER — Telehealth: Payer: Self-pay | Admitting: Nurse Practitioner

## 2023-12-21 NOTE — Telephone Encounter (Signed)
 Patients POA son [name] (legal guardian) would like someone to call to give him a follow up appointment for the patient.    Thank you Gabriel Cirri [facility] AW[facility] TEAM Direct Dial [phone]

## 2023-12-29 ENCOUNTER — Encounter: Payer: Self-pay | Admitting: Nurse Practitioner

## 2023-12-29 ENCOUNTER — Ambulatory Visit (INDEPENDENT_AMBULATORY_CARE_PROVIDER_SITE_OTHER): Payer: Medicare Other | Admitting: Nurse Practitioner

## 2023-12-29 VITALS — BP 130/69 | HR 79 | Temp 97.0°F | Wt 121.0 lb

## 2023-12-29 DIAGNOSIS — F0390 Unspecified dementia without behavioral disturbance: Secondary | ICD-10-CM | POA: Diagnosis not present

## 2023-12-29 DIAGNOSIS — R8290 Unspecified abnormal findings in urine: Secondary | ICD-10-CM

## 2023-12-29 DIAGNOSIS — I421 Obstructive hypertrophic cardiomyopathy: Secondary | ICD-10-CM | POA: Diagnosis not present

## 2023-12-29 DIAGNOSIS — G4700 Insomnia, unspecified: Secondary | ICD-10-CM | POA: Insufficient documentation

## 2023-12-29 DIAGNOSIS — G4701 Insomnia due to medical condition: Secondary | ICD-10-CM

## 2023-12-29 DIAGNOSIS — Z23 Encounter for immunization: Secondary | ICD-10-CM | POA: Diagnosis not present

## 2023-12-29 LAB — POCT URINALYSIS DIP (CLINITEK)
Bilirubin, UA: NEGATIVE
Blood, UA: NEGATIVE
Glucose, UA: NEGATIVE mg/dL
Ketones, POC UA: NEGATIVE mg/dL
Nitrite, UA: NEGATIVE
POC PROTEIN,UA: NEGATIVE
Spec Grav, UA: >=1.03 — AB (ref 1.010–1.025)
Urobilinogen, UA: 0.2 U/dL
pH, UA: 5.5 (ref 5.0–8.0)

## 2023-12-29 MED ORDER — METOPROLOL SUCCINATE ER 25 MG PO TB24: 25.0000 mg | ORAL_TABLET | Freq: Every day | ORAL | 1 refills | Status: DC

## 2023-12-29 MED ORDER — TRAZODONE HCL 50 MG PO TABS: 50.0000 mg | ORAL_TABLET | Freq: Every day | ORAL | 1 refills | Status: DC

## 2023-12-29 MED ORDER — MEMANTINE HCL ER 28 MG PO CP24: 28.0000 mg | ORAL_CAPSULE | Freq: Every day | ORAL | 1 refills | Status: DC

## 2023-12-29 NOTE — Assessment & Plan Note (Signed)
Lab Results  Component Value Date   COLORU straw (A) 12/29/2023   CLARITYU clear 12/29/2023   GLUCOSEUR negative 12/29/2023   BILIRUBINUR negative 12/29/2023   KETONESU neg 05/26/2021   SPECGRAV >=1.030 (A) 12/29/2023   RBCUR negative 12/29/2023   PHUR 5.5 12/29/2023   PROTEINUR Negative 05/26/2021   UROBILINOGEN 0.2 12/29/2023   LEUKOCYTESUR Trace (A) 12/29/2023  Urine culture ordered Encouraged to drink at least 64 ounces of water daily to maintain hydration

## 2023-12-29 NOTE — Assessment & Plan Note (Signed)
Continue Namenda 28 mg daily Her dementia might be progressing Encouraged to follow-up with neurology

## 2023-12-29 NOTE — Progress Notes (Signed)
New Patient Office Visit  Subjective:  Patient ID: Katelyn Sparks, female    DOB: 1934-12-19  Age: 89 y.o. MRN: [id number]  CC:  Chief Complaint  Patient presents with   Urinary Tract Infection    HPI Katelyn Sparks is a 89 y.o. female  has a past medical history of Blood in stool, Closed dislocation of left shoulder (03/2019), Dementia (HCC), Esophageal reflux, Flatulence, eructation, and gas pain, Osteoarthrosis, unspecified whether generalized or localized, unspecified site, and Unspecified hemorrhoids without mention of complication.    Patient  and her son presents for complaints of possible UTI.  She is accompanied by her son who she lives with The patient's son complaining of patient being more confused.  The patient denies fever, abdominal pain, nausea, vomiting , flank pain ,dysuria, urinary hesitancy,  Dementia.  Takes Namenda 10 mg daily.  Patient was referred to neurology during her last visit, neurology office had tried contacting them to schedule an appointment without success.  They were encouraged to follow-up with neurology due to  their complaints her increased confusion     Past Medical History:  Diagnosis Date   Blood in stool    Closed dislocation of left shoulder 03/2019   Dementia (HCC)    Esophageal reflux    Flatulence, eructation, and gas pain    Osteoarthrosis, unspecified whether generalized or localized, unspecified site    Unspecified hemorrhoids without mention of complication     Past Surgical History:  Procedure Laterality Date   ABDOMINAL HYSTERECTOMY     COLONOSCOPY  11/2010   UPPER GI ENDOSCOPY  11/2010    Family History  Problem Relation Age of Onset   Hypertension Mother    Diabetes Mother    Diabetes Sister    Diabetes Brother    Diabetes Other    Colon cancer Neg Hx    Cancer Neg Hx    Early death Neg Hx    Heart disease Neg Hx    Hyperlipidemia Neg Hx    Kidney disease Neg Hx    Learning disabilities Neg Hx    Stroke Neg  Hx     Social History   Socioeconomic History   Marital status: Widowed    Spouse name: Not on file   Number of children: 2   Years of education: 58   Highest education level: Not on file  Occupational History   Occupation: Retired    Associate Professor: RETIRED  Tobacco Use   Smoking status: Never   Smokeless tobacco: Never  Vaping Use   Vaping status: Never Used  Substance and Sexual Activity   Alcohol use: No    Alcohol/week: 0.0 standard drinks of alcohol   Drug use: No   Sexual activity: Not Currently  Other Topics Concern   Not on file  Social History Narrative   She is a retired from a tobacco company.   Her husband passed away in January 30, 2000.  She has two son.   Patient is right handed.   Her grandson lives with her.   Patient drinks 1 cup of caffeine daily   Social Drivers of Health   Financial Resource Strain: Not on file  Food Insecurity: No Food Insecurity (08/25/2023)   Hunger Vital Sign    Worried About Running Out of Food in the Last Year: Never true    Ran Out of Food in the Last Year: Never true  Transportation Needs: No Transportation Needs (08/25/2023)   PRAPARE - Transportation    Lack  of Transportation (Medical): No    Lack of Transportation (Non-Medical): No  Physical Activity: Not on file  Stress: Not on file  Social Connections: Not on file  Intimate Partner Violence: Not At Risk (08/25/2023)   Humiliation, Afraid, Rape, and Kick questionnaire    Fear of Current or Ex-Partner: No    Emotionally Abused: No    Physically Abused: No    Sexually Abused: No    ROS Review of Systems  Constitutional:  Negative for appetite change, chills, fatigue and fever.  HENT:  Negative for congestion, postnasal drip, rhinorrhea and sneezing.   Respiratory:  Negative for cough, shortness of breath and wheezing.   Cardiovascular:  Negative for chest pain, palpitations and leg swelling.  Gastrointestinal:  Negative for abdominal pain, constipation, nausea and vomiting.   Genitourinary:  Negative for difficulty urinating, dysuria, flank pain and frequency.  Musculoskeletal:  Negative for arthralgias, back pain, joint swelling and myalgias.  Skin:  Negative for color change, pallor, rash and wound.  Neurological:  Negative for dizziness, facial asymmetry, weakness, numbness and headaches.  Psychiatric/Behavioral:  Negative for behavioral problems, confusion, self-injury and suicidal ideas.        Patient sitting quietly in the chair    Objective:   Today's Vitals: BP 130/69   Pulse 79   Temp (!) 97 F (36.1 C)   Wt 121 lb (54.9 kg)   SpO2 98%   BMI 20.77 kg/m   Physical Exam Vitals and nursing note reviewed.  Constitutional:      General: She is not in acute distress.    Appearance: Normal appearance. She is not ill-appearing, toxic-appearing or diaphoretic.  HENT:     Mouth/Throat:     Mouth: Mucous membranes are moist.     Pharynx: Oropharynx is clear. No oropharyngeal exudate or posterior oropharyngeal erythema.  Eyes:     General: No scleral icterus.       Right eye: No discharge.        Left eye: No discharge.     Extraocular Movements: Extraocular movements intact.     Conjunctiva/sclera: Conjunctivae normal.  Cardiovascular:     Rate and Rhythm: Normal rate and regular rhythm.     Pulses: Normal pulses.     Heart sounds: Normal heart sounds. No murmur heard.    No friction rub. No gallop.  Pulmonary:     Effort: Pulmonary effort is normal. No respiratory distress.     Breath sounds: Normal breath sounds. No stridor. No wheezing, rhonchi or rales.  Chest:     Chest wall: No tenderness.  Abdominal:     General: There is no distension.     Palpations: Abdomen is soft.     Tenderness: There is no abdominal tenderness. There is no right CVA tenderness, left CVA tenderness or guarding.  Musculoskeletal:        General: No swelling, tenderness, deformity or signs of injury.     Right lower leg: No edema.     Left lower leg: No  edema.  Skin:    General: Skin is warm and dry.     Capillary Refill: Capillary refill takes less than 2 seconds.     Coloration: Skin is not jaundiced or pale.     Findings: No bruising, erythema or lesion.  Neurological:     Mental Status: She is alert. Mental status is at baseline.     Motor: No weakness.     Coordination: Coordination normal.     Gait: Gait  normal.  Psychiatric:        Mood and Affect: Mood normal.        Behavior: Behavior normal.        Thought Content: Thought content normal.        Judgment: Judgment normal.     Assessment & Plan:   Problem List Items Addressed This Visit       Cardiovascular and Mediastinum   HOCM (hypertrophic obstructive cardiomyopathy) (HCC)   Doing well on metoprolol 25 mg daily Has not followed up with cardiology, he was encouraged to call cardiology office to schedule an appointment for follow-up      Relevant Medications   metoprolol succinate (TOPROL XL) 25 MG 24 hr tablet     Nervous and Auditory   Dementia (HCC)   Continue Namenda 28 mg daily Her dementia might be progressing Encouraged to follow-up with neurology      Relevant Medications   memantine (NAMENDA XR) 28 MG CP24 24 hr capsule   traZODone (DESYREL) 50 MG tablet     Other   Insomnia due to medical condition    - traZODone (DESYREL) 50 MG tablet; Take 1 tablet (50 mg total) by mouth at bedtime.  Dispense: 90 tablet; Refill: 1       Relevant Medications   traZODone (DESYREL) 50 MG tablet   Abnormal urine findings - Primary   Lab Results  Component Value Date   COLORU straw (A) 12/29/2023   CLARITYU clear 12/29/2023   GLUCOSEUR negative 12/29/2023   BILIRUBINUR negative 12/29/2023   KETONESU neg 05/26/2021   SPECGRAV >=1.030 (A) 12/29/2023   RBCUR negative 12/29/2023   PHUR 5.5 12/29/2023   PROTEINUR Negative 05/26/2021   UROBILINOGEN 0.2 12/29/2023   LEUKOCYTESUR Trace (A) 12/29/2023  Urine culture ordered Encouraged to drink at least 64  ounces of water daily to maintain hydration      Relevant Orders   POCT URINALYSIS DIP (CLINITEK) (Completed)   Urine Culture    Outpatient Encounter Medications as of 12/29/2023  Medication Sig   docusate sodium (COLACE) 100 MG capsule Take 1 capsule (100 mg total) by mouth every 12 (twelve) hours.   polyethylene glycol (MIRALAX) 17 g packet Take 17 g by mouth daily as needed.   [DISCONTINUED] memantine (NAMENDA XR) 28 MG CP24 24 hr capsule Take 1 capsule (28 mg total) by mouth daily.   [DISCONTINUED] metoprolol succinate (TOPROL XL) 25 MG 24 hr tablet Take 1 tablet (25 mg total) by mouth daily.   [DISCONTINUED] traZODone (DESYREL) 50 MG tablet Take 50 mg by mouth at bedtime.   memantine (NAMENDA XR) 28 MG CP24 24 hr capsule Take 1 capsule (28 mg total) by mouth daily.   metoprolol succinate (TOPROL XL) 25 MG 24 hr tablet Take 1 tablet (25 mg total) by mouth daily.   traZODone (DESYREL) 50 MG tablet Take 1 tablet (50 mg total) by mouth at bedtime.   [DISCONTINUED] omeprazole (PRILOSEC) 40 MG capsule Take 1 capsule (40 mg total) by mouth daily. (Patient not taking: Reported on 12/10/2019)   No facility-administered encounter medications on file as of 12/29/2023.    Follow-up: Return in about 4 months (around 04/27/2024), or cardiomyopathy.   Donell Beers, FNP

## 2023-12-29 NOTE — Assessment & Plan Note (Signed)
-   traZODone (DESYREL) 50 MG tablet; Take 1 tablet (50 mg total) by mouth at bedtime.  Dispense: 90 tablet; Refill: 1

## 2023-12-29 NOTE — Patient Instructions (Addendum)
Please consider getting Shingrix , COVID-19 vaccine and Tdap vaccine and influenza vaccine at local pharmacy if not up-to-date    Please follow-up with a neurologist at [facility].  Phone #[phone]  He also needs to follow-up with cardiology please call the office and schedule an appointment  Please follow-up with cardiology for your cardiomyopathy phone #[phone]  1. Dementia without behavioral disturbance, psychotic disturbance, mood disturbance, or anxiety, unspecified dementia severity, unspecified dementia type (HCC)  - memantine (NAMENDA XR) 28 MG CP24 24 hr capsule; Take 1 capsule (28 mg total) by mouth daily.  Dispense: 90 capsule; Refill: 1 - traZODone (DESYREL) 50 MG tablet; Take 1 tablet (50 mg total) by mouth at bedtime.  Dispense: 90 tablet; Refill: 1 - POCT URINALYSIS DIP (CLINITEK)  2. Insomnia, unspecified type (Primary)   3. HOCM (hypertrophic obstructive cardiomyopathy) (HCC)  - metoprolol succinate (TOPROL XL) 25 MG 24 hr tablet; Take 1 tablet (25 mg total) by mouth daily.  Dispense: 90 tablet; Refill: 1    It is important that you exercise regularly at least 30 minutes 5 times a week as tolerated  Think about what you will eat, plan ahead. Choose " clean, green, fresh or frozen" over canned, processed or packaged foods which are more sugary, salty and fatty. 70 to 75% of food eaten should be vegetables and fruit. Three meals at set times with snacks allowed between meals, but they must be fruit or vegetables. Aim to eat over a 12 hour period , example 7 am to 7 pm, and STOP after  your last meal of the day. Drink water,generally about 64 ounces per day, no other drink is as healthy. Fruit juice is best enjoyed in a healthy way, by EATING the fruit.  Thanks for choosing [facility] we consider it a privelige to serve you.

## 2023-12-29 NOTE — Assessment & Plan Note (Signed)
Doing well on metoprolol 25 mg daily Has not followed up with cardiology, he was encouraged to call cardiology office to schedule an appointment for follow-up

## 2023-12-29 NOTE — Progress Notes (Signed)
Poc

## 2023-12-31 LAB — URINE CULTURE

## 2024-04-29 ENCOUNTER — Ambulatory Visit: Payer: Self-pay | Admitting: Nurse Practitioner

## 2024-07-03 ENCOUNTER — Encounter: Payer: Self-pay | Admitting: Nurse Practitioner

## 2024-07-03 ENCOUNTER — Telehealth: Payer: Self-pay

## 2024-07-03 ENCOUNTER — Ambulatory Visit (INDEPENDENT_AMBULATORY_CARE_PROVIDER_SITE_OTHER): Payer: Self-pay | Admitting: Nurse Practitioner

## 2024-07-03 VITALS — BP 138/81 | HR 100 | Wt 108.6 lb

## 2024-07-03 DIAGNOSIS — Z13220 Encounter for screening for lipoid disorders: Secondary | ICD-10-CM

## 2024-07-03 DIAGNOSIS — F0390 Unspecified dementia without behavioral disturbance: Secondary | ICD-10-CM | POA: Diagnosis not present

## 2024-07-03 DIAGNOSIS — E162 Hypoglycemia, unspecified: Secondary | ICD-10-CM

## 2024-07-03 LAB — POCT GLYCOSYLATED HEMOGLOBIN (HGB A1C): Hemoglobin A1C: 5.7 % — AB (ref 4.0–5.6)

## 2024-07-03 MED ORDER — BLOOD GLUCOSE TEST VI STRP
1.0000 | ORAL_STRIP | Freq: Two times a day (BID) | 0 refills | Status: AC
Start: 2024-07-03 — End: 2024-08-22

## 2024-07-03 MED ORDER — LANCET DEVICE MISC: 1.0000 | Freq: Three times a day (TID) | 0 refills | Status: AC

## 2024-07-03 MED ORDER — BLOOD GLUCOSE MONITORING SUPPL DEVI: 1.0000 | Freq: Two times a day (BID) | 0 refills | Status: AC

## 2024-07-03 MED ORDER — LANCETS MISC. MISC: 1.0000 | Freq: Three times a day (TID) | 0 refills | Status: AC

## 2024-07-03 NOTE — Progress Notes (Signed)
 Subjective   Patient ID: Katelyn Sparks, female    DOB: 07-21-1935, 89 y.o.   MRN: [id number]  Chief Complaint  Patient presents with   Medical Management of Chronic Issues    Low energy, difficult eating and when son checked her Blood sugar it was 57    Referring provider: Oley Bascom RAMAN, NP  Katelyn Sparks is a 89 y.o. female with Past Medical History: No date: Blood in stool 03/2019: Closed dislocation of left shoulder No date: Dementia (HCC) No date: Esophageal reflux No date: Flatulence, eructation, and gas pain No date: Osteoarthrosis, unspecified whether generalized or localized,  unspecified site No date: Unspecified hemorrhoids without mention of complication   HPI  Dementia.  Takes Namenda  10 mg daily.  Patient was referred to neurology during her last visit, neurology office had tried contacting them to schedule an appointment without success.  They were encouraged to follow-up with neurology due to  their complaints her increased confusion. Also, having decreased appetite. Discussed that she needs to eat 6 snacks through out the day and try ensure.  Patient did have low blood sugars other day.  We will try to order a glucometer.  Referrals have been placed for home health nursing and aide.  Referral has been placed for social worker because son is interested in possible group home setting for his mom.  Denies f/c/s, n/v/d, hemoptysis, PND, leg swelling Denies chest pain or edema    No Known Allergies  Immunization History  Administered Date(s) Administered   Influenza Split 08/23/2012   Influenza Whole 08/29/2010   Influenza, High Dose Seasonal PF 09/11/2016, 08/31/2019, 08/31/2019   Influenza,inj,Quad PF,6+ Mos 08/28/2013, 08/20/2014, 11/10/2015, 10/17/2018, 08/20/2021   Influenza-Unspecified 09/11/2019   PFIZER(Purple Top)SARS-COV-2 Vaccination 02/09/2020, 03/03/2020   Pneumococcal Conjugate-13 08/28/2013   Pneumococcal Polysaccharide-23 04/13/2015   Tdap  08/28/2013    Tobacco History: Social History   Tobacco Use  Smoking Status Never  Smokeless Tobacco Never   Counseling given: Not Answered   Outpatient Encounter Medications as of 07/03/2024  Medication Sig   Blood Glucose Monitoring Suppl DEVI 1 each by Does not apply route in the morning and at bedtime. May substitute to any manufacturer covered by patient's insurance.   docusate sodium  (COLACE) 100 MG capsule Take 1 capsule (100 mg total) by mouth every 12 (twelve) hours.   Glucose Blood (BLOOD GLUCOSE TEST STRIPS) STRP 1 each by In Vitro route 2 (two) times daily. May substitute to any manufacturer covered by patient's insurance.   Lancet Device MISC 1 each by Does not apply route in the morning, at noon, and at bedtime. May substitute to any manufacturer covered by patient's insurance.   Lancets Misc. MISC 1 each by Does not apply route in the morning, at noon, and at bedtime. May substitute to any manufacturer covered by patient's insurance.   memantine  (NAMENDA  XR) 28 MG CP24 24 hr capsule Take 1 capsule (28 mg total) by mouth daily.   metoprolol  succinate (TOPROL  XL) 25 MG 24 hr tablet Take 1 tablet (25 mg total) by mouth daily.   polyethylene glycol (MIRALAX ) 17 g packet Take 17 g by mouth daily as needed.   traZODone  (DESYREL ) 50 MG tablet Take 1 tablet (50 mg total) by mouth at bedtime.   [DISCONTINUED] omeprazole  (PRILOSEC) 40 MG capsule Take 1 capsule (40 mg total) by mouth daily. (Patient not taking: Reported on 12/10/2019)   No facility-administered encounter medications on file as of 07/03/2024.    Review of  Systems  Review of Systems  Constitutional: Negative.   HENT: Negative.    Cardiovascular: Negative.   Gastrointestinal: Negative.   Allergic/Immunologic: Negative.   Neurological: Negative.   Psychiatric/Behavioral: Negative.       Objective:   BP 138/81   Pulse 100   Wt 108 lb 9.6 oz (49.3 kg)   SpO2 (!) 85%   BMI 18.64 kg/m   Wt Readings from  Last 5 Encounters:  07/03/24 108 lb 9.6 oz (49.3 kg)  12/29/23 121 lb (54.9 kg)  12/03/23 125 lb 10.6 oz (57 kg)  09/01/23 126 lb (57.2 kg)  08/25/23 124 lb 1.9 oz (56.3 kg)     Physical Exam Vitals and nursing note reviewed.  Constitutional:      General: She is not in acute distress.    Appearance: She is well-developed.  Cardiovascular:     Rate and Rhythm: Normal rate and regular rhythm.  Pulmonary:     Effort: Pulmonary effort is normal.     Breath sounds: Normal breath sounds.  Neurological:     Mental Status: She is alert and oriented to person, place, and time.       Assessment & Plan:   Dementia without behavioral disturbance, psychotic disturbance, mood disturbance, or anxiety, unspecified dementia severity, unspecified dementia type (HCC) -     Ambulatory referral to Home Health -     AMB Referral VBCI Care Management  Hypoglycemia -     Blood Glucose Monitoring Suppl; 1 each by Does not apply route in the morning and at bedtime. May substitute to any manufacturer covered by patient's insurance.  Dispense: 1 each; Refill: 0 -     Blood Glucose Test; 1 each by In Vitro route 2 (two) times daily. May substitute to any manufacturer covered by patient's insurance.  Dispense: 100 strip; Refill: 0 -     Lancet Device; 1 each by Does not apply route in the morning, at noon, and at bedtime. May substitute to any manufacturer covered by patient's insurance.  Dispense: 1 each; Refill: 0 -     Lancets Misc.; 1 each by Does not apply route in the morning, at noon, and at bedtime. May substitute to any manufacturer covered by patient's insurance.  Dispense: 100 each; Refill: 0 -     CBC -     Comprehensive metabolic panel with GFR -     POCT glycosylated hemoglobin (Hb A1C)  Lipid screening -     Lipid panel     Return in about 4 weeks (around 07/31/2024) for follow up on home health referral and appetite.   Bascom GORMAN Borer, NP 07/03/2024

## 2024-07-03 NOTE — Progress Notes (Signed)
 Complex Care Management Note Care Guide Note  07/03/2024 Name: Katelyn Sparks MRN: [id number] DOB: 04-29-1935   Complex Care Management Outreach Attempts: An unsuccessful telephone outreach was attempted today to offer the patient information about available complex care management services.  Follow Up Plan:  Additional outreach attempts will be made to offer the patient complex care management information and services.   Encounter Outcome:  No Answer  [facility]  [facility], [facility] Health Care Management Assistant Direct Dial: [phone]  Fax: [phone]

## 2024-07-03 NOTE — Telephone Encounter (Signed)
 Copied from CRM [id number]. Topic: Clinical - Medical Advice >> Jul 03, 2024  3:27 PM Everette C wrote: Reason for CRM: Damien with [facility] has called to follow up on a request for home health services for the patient/ [facility] will be unable to see the patient for services, their team is currently at capacity. Please contact further if needed at [phone]

## 2024-07-03 NOTE — Progress Notes (Signed)
 Complex Care Management Note  Care Guide Note 07/03/2024 Name: Katelyn Sparks MRN: [id number] DOB: [birth year]  Katelyn Sparks is a 89 y.o. year old female who sees Oley Bascom RAMAN, NP for primary care. I reached out to [facility] [employer] by phone today to offer complex care management services.  [name] was given information about Complex Care Management services today including:   The Complex Care Management services include support from the care team which includes your Nurse Care Manager, Clinical Social Worker, or Pharmacist.  The Complex Care Management team is here to help remove barriers to the health concerns and goals most important to you. Complex Care Management services are voluntary, and the patient may decline or stop services at any time by request to their care team member.   Complex Care Management Consent Status: Patient agreed to services and verbal consent obtained.   Follow up plan:  Telephone appointment with complex care management team member scheduled for:  07/15/24 at 9:00 a.m.   Encounter Outcome:  Patient Scheduled  [facility]  [facility], [facility] Health Care Management Assistant Direct Dial: [phone]  Fax: [phone]

## 2024-07-04 ENCOUNTER — Ambulatory Visit: Payer: Self-pay | Admitting: Nurse Practitioner

## 2024-07-04 LAB — COMPREHENSIVE METABOLIC PANEL WITH GFR
ALT: 8 IU/L (ref 0–32)
AST: 14 IU/L (ref 0–40)
Albumin: 4.2 g/dL (ref 3.7–4.7)
Alkaline Phosphatase: 84 IU/L (ref 44–121)
BUN/Creatinine Ratio: 13 (ref 12–28)
BUN: 12 mg/dL (ref 8–27)
Bilirubin Total: 0.4 mg/dL (ref 0.0–1.2)
CO2: 24 mmol/L (ref 20–29)
Calcium: 9.8 mg/dL (ref 8.7–10.3)
Chloride: 105 mmol/L (ref 96–106)
Creatinine, Ser: 0.96 mg/dL (ref 0.57–1.00)
Globulin, Total: 3.3 g/dL (ref 1.5–4.5)
Glucose: 109 mg/dL — ABNORMAL HIGH (ref 70–99)
Potassium: 4.1 mmol/L (ref 3.5–5.2)
Sodium: 145 mmol/L — ABNORMAL HIGH (ref 134–144)
Total Protein: 7.5 g/dL (ref 6.0–8.5)
eGFR: 57 mL/min/1.73 — ABNORMAL LOW (ref >59)

## 2024-07-04 LAB — CBC
Hematocrit: 41.8 % (ref 34.0–46.6)
Hemoglobin: 13.7 g/dL (ref 11.1–15.9)
MCH: 29.3 pg (ref 26.6–33.0)
MCHC: 32.8 g/dL (ref 31.5–35.7)
MCV: 89 fL (ref 79–97)
Platelets: 172 x10E3/uL (ref 150–450)
RBC: 4.68 x10E6/uL (ref 3.77–5.28)
RDW: 14.7 % (ref 11.7–15.4)
WBC: 4.2 x10E3/uL (ref 3.4–10.8)

## 2024-07-04 LAB — LIPID PANEL
Chol/HDL Ratio: 4.4 ratio (ref 0.0–4.4)
Cholesterol, Total: 300 mg/dL — ABNORMAL HIGH (ref 100–199)
HDL: 68 mg/dL (ref >39)
LDL Chol Calc (NIH): 215 mg/dL — ABNORMAL HIGH (ref 0–99)
Triglycerides: 102 mg/dL (ref 0–149)
VLDL Cholesterol Cal: 17 mg/dL (ref 5–40)

## 2024-07-04 NOTE — Telephone Encounter (Signed)
 Thank you for letting me know.  Referral has been sent to [facility] in [place].

## 2024-07-15 ENCOUNTER — Encounter: Payer: Self-pay | Admitting: Licensed Clinical Social Worker

## 2024-07-15 ENCOUNTER — Telehealth: Payer: Self-pay | Admitting: Licensed Clinical Social Worker

## 2024-07-15 NOTE — Patient Instructions (Signed)
 [facility] [employer] - I am sorry I was unable to reach you today for our scheduled appointment. I work with Oley Bascom RAMAN, NP and am calling to support your healthcare needs. Please contact me at [phone] at your earliest convenience. I look forward to speaking with you soon.   Thank you,  Rolin Kerns, LCSW [place]  [facility], [facility] Clinical Social Worker Direct Dial: [phone]  Fax: [phone] Website: [email or web address] 9:34 AM

## 2024-07-25 ENCOUNTER — Emergency Department (HOSPITAL_BASED_OUTPATIENT_CLINIC_OR_DEPARTMENT_OTHER)
Admission: EM | Admit: 2024-07-25 | Discharge: 2024-07-25 | Disposition: A | Discharge status: Home / Self Care | Attending: Emergency Medicine | Admitting: Emergency Medicine

## 2024-07-25 ENCOUNTER — Ambulatory Visit: Payer: Self-pay

## 2024-07-25 ENCOUNTER — Encounter (HOSPITAL_BASED_OUTPATIENT_CLINIC_OR_DEPARTMENT_OTHER): Payer: Self-pay | Admitting: Emergency Medicine

## 2024-07-25 ENCOUNTER — Other Ambulatory Visit: Payer: Self-pay

## 2024-07-25 DIAGNOSIS — R82998 Other abnormal findings in urine: Secondary | ICD-10-CM | POA: Diagnosis not present

## 2024-07-25 DIAGNOSIS — F03C3 Unspecified dementia, severe, with mood disturbance: Secondary | ICD-10-CM | POA: Insufficient documentation

## 2024-07-25 DIAGNOSIS — N3000 Acute cystitis without hematuria: Secondary | ICD-10-CM

## 2024-07-25 DIAGNOSIS — R456 Violent behavior: Secondary | ICD-10-CM | POA: Diagnosis present

## 2024-07-25 LAB — URINALYSIS, ROUTINE W REFLEX MICROSCOPIC
Bilirubin Urine: NEGATIVE
Glucose, UA: NEGATIVE mg/dL
Hgb urine dipstick: NEGATIVE
Ketones, ur: NEGATIVE mg/dL
Nitrite: NEGATIVE
Protein, ur: NEGATIVE mg/dL
Specific Gravity, Urine: 1.023 (ref 1.005–1.030)
pH: 6 (ref 5.0–8.0)

## 2024-07-25 MED ORDER — SODIUM CHLORIDE 0.9 % IV BOLUS
500.0000 mL | Freq: Once | INTRAVENOUS | Status: AC
  Administered 2024-07-25: 500 mL via INTRAVENOUS

## 2024-07-25 MED ORDER — FOSFOMYCIN TROMETHAMINE 3 G PO PACK
3.0000 g | PACK | Freq: Once | ORAL | Status: AC
  Administered 2024-07-25: 3 g via ORAL

## 2024-07-25 NOTE — ED Notes (Signed)
 Unable to provide urine sample

## 2024-07-25 NOTE — Telephone Encounter (Signed)
 FYI Only or Action Required?: FYI only for provider.  Patient was last seen in primary care on 07/03/2024 by Oley Bascom RAMAN, NP.  Called Nurse Triage reporting Urinary Tract Infection.  Symptoms began son thinks x 2 weeks.  Interventions attempted: Other: unable to answer.  Symptoms are: son unsure and unable to answer (he thinks urinary odor) unsure if improved or worsening.  Triage Disposition: See Physician Within 24 Hours (overriding Call PCP Now)  Patient/caregiver understands and will follow disposition?: Yes          Copied from CRM #[id number]. Topic: Clinical - Medical Advice >> Jul 25, 2024 11:23 AM Ahlexyia S wrote: Reason for CRM: Pt son Franky called in to get next steps on pt diagnosis. Pt was recently diagnosed with UTI and Franky wants to know what will be his next steps for pt so this diagnosis can be resolved. Warm transferred to nurse triage. Reason for Disposition  [1] Caller demands to speak with the PCP AND [2] about sick adult (or sick caller)  Answer Assessment - Initial Assessment Questions Patient's son, Franky, calling in for triage. He is not with the patient or in [place]. He became very upset when RN asked if patient or caregiver could also be called to help answer assessment symptoms. He states he thinks maybe she has odor or maybe no symptoms. He states the caregiver did a home urinalysis test that was positive. He states we just want a cure, that's all I'm asking for. He can only describe symptoms as: forgetting to go to the bathroom over the past 2 weeks. Myles Franky that it is best practice if we get first hand account of what symptoms the patient is experiencing from the caregiver or his nephew. He states he would prefer to schedule the patient for a visit. Advised to have caregiver call back for any worsening or emergent symptoms. Patient scheduled for tomorrow morning.   1. SYMPTOM: What's the main symptom you're concerned about? (e.g.,  frequency, incontinence)     Son unable to answer.  2. ONSET: When did the  N/A  start?     Son unable to answer.  3. PAIN: Is there any pain? If Yes, ask: How bad is it? (Scale: 1-10; mild, moderate, severe)     Son unable to answer.  4. CAUSE: What do you think is causing the symptoms?     Son unable to answer.  5. OTHER SYMPTOMS: Do you have any other symptoms? (e.g., blood in urine, fever, flank pain, pain with urination)     Son unable to answer.  6. PREGNANCY: Is there any chance you are pregnant? When was your last menstrual period?     N/A.  Protocols used: Urinary Symptoms-A-AH, Difficult Call-A-AH

## 2024-07-25 NOTE — ED Provider Notes (Signed)
 [place] EMERGENCY DEPARTMENT AT [facility] Provider Note   CSN: [id number] Arrival date & time: 07/25/24  1256     Patient presents with: Dysuria   Katelyn Sparks is a 89 y.o. female.   HPI Patient has significant dementia.  She is cared for at home by family members.  Patient's grandson who is caregiver with home nursing reports that the patient did have malodorous urine and they did a home UTI test which was positive.  He reports that she has had some behavioral change and been more aggressive.  He reports this has been typical with UTIs.  He reports patient probably is not hydrating enough orally but she is eating and drinking.  He reports sometimes she forgets that she has not eaten and will refuse food stating that she just ate.  No vomiting, no shortness of breath, no cough.  No fevers.    Prior to Admission medications   Medication Sig Start Date End Date Taking? Authorizing Provider  Blood Glucose Monitoring Suppl DEVI 1 each by Does not apply route in the morning and at bedtime. May substitute to any manufacturer covered by patient's insurance. 07/03/24   Oley Bascom RAMAN, NP  docusate sodium  (COLACE) 100 MG capsule Take 1 capsule (100 mg total) by mouth every 12 (twelve) hours. 12/03/23   Steinl, Kevin, MD  Glucose Blood (BLOOD GLUCOSE TEST STRIPS) STRP 1 each by In Vitro route 2 (two) times daily. May substitute to any manufacturer covered by patient's insurance. 07/03/24 08/22/24  Oley Bascom RAMAN, NP  Lancet Device MISC 1 each by Does not apply route in the morning, at noon, and at bedtime. May substitute to any manufacturer covered by patient's insurance. 07/03/24 08/02/24  Oley Bascom RAMAN, NP  Lancets Misc. MISC 1 each by Does not apply route in the morning, at noon, and at bedtime. May substitute to any manufacturer covered by patient's insurance. 07/03/24 08/02/24  Oley Bascom RAMAN, NP  memantine  (NAMENDA  XR) 28 MG CP24 24 hr capsule Take 1 capsule (28 mg total) by  mouth daily. 12/29/23   Paseda, Folashade R, FNP  metoprolol  succinate (TOPROL  XL) 25 MG 24 hr tablet Take 1 tablet (25 mg total) by mouth daily. 12/29/23   Paseda, Folashade R, FNP  polyethylene glycol (MIRALAX ) 17 g packet Take 17 g by mouth daily as needed. 12/03/23   Steinl, Kevin, MD  traZODone  (DESYREL ) 50 MG tablet Take 1 tablet (50 mg total) by mouth at bedtime. 12/29/23   Paseda, Folashade R, FNP  omeprazole  (PRILOSEC) 40 MG capsule Take 1 capsule (40 mg total) by mouth daily. Patient not taking: Reported on 12/10/2019 10/17/18 05/31/20  Stroud, Natalie M, FNP    Allergies: Patient has no known allergies.    Review of Systems  Updated Vital Signs BP 113/66   Pulse (!) 53   Temp 97.7 F (36.5 C) (Oral)   Resp 18   SpO2 100%   Physical Exam Constitutional:      Comments: Patient is alert.  No signs of distress.  No respiratory distress.  Cooperative with some verbal commentary  HENT:     Head: Normocephalic and atraumatic.     Mouth/Throat:     Pharynx: Oropharynx is clear.  Eyes:     Extraocular Movements: Extraocular movements intact.  Cardiovascular:     Rate and Rhythm: Normal rate and regular rhythm.  Pulmonary:     Effort: Pulmonary effort is normal.     Breath sounds: Normal breath sounds.  Abdominal:     General: There is no distension.     Palpations: Abdomen is soft.     Tenderness: There is no abdominal tenderness. There is no guarding.  Musculoskeletal:        General: No swelling or tenderness. Normal range of motion.     Right lower leg: No edema.     Left lower leg: No edema.  Skin:    General: Skin is warm and dry.  Neurological:     General: No focal deficit present.     Comments: Patient is awake and alert she is picking slightly at the bed covers or the blood pressure cuff.  She is calm.  Her verbal interactions are not situationally apropos.  The patient is somewhat helpful in performing the physical exam.  No focal motor deficits.     (all labs  ordered are listed, but only abnormal results are displayed) Labs Reviewed  URINALYSIS, ROUTINE W REFLEX MICROSCOPIC    EKG: None  Radiology: No results found.   Procedures   Medications Ordered in the ED - No data to display                                  Medical Decision Making Amount and/or Complexity of Data Reviewed Labs: ordered.   Patient presents as outlined.  She is seen with her grandson at bedside.  She is alert and in no distress.  Physical exam is unremarkable.  Family members have noted some additional amount of aggression with dementia and identified a positive urine specimen by home testing.  At this time we will proceed with UA.  Clinically patient is nontoxic and well in appearance.  Blood pressures are normotensive.  Heart rate is regular and oxygen saturation 100%.  Clinically no signs of specific illness.  Patient is not having any vomiting or diarrhea.  No clinical indication of volume loss.  If UA negative anticipate discharge home.  If positive anticipate treatment with antibiotics.  Dr. Patsey to follow-up on urine results for final disposition.     Final diagnoses:  Severe dementia with mood disturbance, unspecified dementia type [facility])    ED Discharge Orders     None          Armenta Canning, MD 07/25/24 1534

## 2024-07-25 NOTE — ED Notes (Signed)
 Patient and  grandson notified of need for urine sample to complete eval/assessment. Specimen cup provided and instructions for clean catch given. Patient will notify staff when able to provide.

## 2024-07-25 NOTE — Discharge Instructions (Signed)
 She has been given fosfomycin which should treat the UTI.  Follow-up with her doctor if symptoms are not improving.

## 2024-07-25 NOTE — ED Triage Notes (Addendum)
 Malodorous urine and + home UTI test Noticed yesterday Brought by grandson Dx dementia at baseline per grandson  Also reports last BM 3 days ago

## 2024-07-25 NOTE — ED Notes (Signed)
 Ambulatory to restroom

## 2024-07-25 NOTE — ED Provider Notes (Signed)
  Physical Exam  BP 113/66   Pulse (!) 53   Temp 97.7 F (36.5 C) (Oral)   Resp 18   SpO2 100%   Physical Exam  Procedures  Procedures  ED Course / MDM    Medical Decision Making Amount and/or Complexity of Data Reviewed Labs: ordered.  Risk Prescription drug management.   Received in signout.  Possible UTI.  Some change in mental status and change in her urination.  History of dementia.  Urine does show likely infection.  Discussed with patient's grandson who is with her.  Sometimes has some difficulty taking medicines.  Will give fosfomycin here to eliminate that as a complication.  States she had not been drinking very much.  Urine does not show severe dehydration.  However given a small fluid bolus here.  Will discharge.       Patsey Lot, MD 07/25/24 1729

## 2024-07-26 ENCOUNTER — Encounter: Payer: Self-pay | Admitting: Nurse Practitioner

## 2024-07-26 ENCOUNTER — Telehealth: Payer: Self-pay

## 2024-07-26 ENCOUNTER — Ambulatory Visit (INDEPENDENT_AMBULATORY_CARE_PROVIDER_SITE_OTHER): Admitting: Nurse Practitioner

## 2024-07-26 VITALS — BP 118/61 | HR 55 | Temp 97.3°F | Wt 108.0 lb

## 2024-07-26 DIAGNOSIS — N3000 Acute cystitis without hematuria: Secondary | ICD-10-CM | POA: Insufficient documentation

## 2024-07-26 DIAGNOSIS — Z09 Encounter for follow-up examination after completed treatment for conditions other than malignant neoplasm: Secondary | ICD-10-CM | POA: Insufficient documentation

## 2024-07-26 DIAGNOSIS — G4701 Insomnia due to medical condition: Secondary | ICD-10-CM

## 2024-07-26 DIAGNOSIS — F0390 Unspecified dementia without behavioral disturbance: Secondary | ICD-10-CM

## 2024-07-26 NOTE — Assessment & Plan Note (Signed)
  Dementia with medication adherence issues. Darden is primary caregiver. - Continue Memantine  28 MG oral daily. caregiver needs documentation for FMLA purposes. He will bring forms to the office for completion

## 2024-07-26 NOTE — Patient Instructions (Signed)

## 2024-07-26 NOTE — Assessment & Plan Note (Signed)
  Insomnia due to medical condition Insomnia managed with Trazodone   - Continue Trazodone  50 MG oral at bedtime.

## 2024-07-26 NOTE — Assessment & Plan Note (Signed)
 Hospital chart reviewed, including discharge summary Medications reconciled and reviewed with the patient in detail

## 2024-07-26 NOTE — Assessment & Plan Note (Signed)
  Urinary tract infection, recently treated with fosfomycin 3 mg one-time dose in hospital. No further antibiotics needed. - Ensure adequate hydration to prevent recurrence of UTI.

## 2024-07-26 NOTE — Progress Notes (Signed)
 Established Patient Office Visit  Subjective:  Patient ID: Katelyn Sparks, female    DOB: [birth year]  Age: 89 y.o. MRN: [id number]  CC:  Chief Complaint  Patient presents with   Hospitalization Follow-up    HPI    Discussed the use of AI scribe software for clinical note transcription with the patient, who gave verbal consent to proceed.  History of Present Illness An 89 year old female  has a past medical history of Blood in stool, Closed dislocation of left shoulder (03/2019), Dementia (HCC), Esophageal reflux, Flatulence, eructation, and gas pain, Osteoarthrosis, unspecified whether generalized or localized, unspecified site, and Unspecified hemorrhoids without mention of complication.  who presents for follow-up after hospitalization for a urinary tract infection. She is accompanied by her grandson,  who is her primary caregiver.  She was at the ER yesterday for a urinary tract infection and received fosfomycin 3 mg one-time dose so eliminate compliance ,she has a history of difficulty maintaining adequate hydration and some difficulty taking medications  Her history of dementia complicates her ability to manage medications independently. Her grandson,  has been her primary caregiver since grade school and assists with her care. He is seeking documentation to formalize his role as her caregiver for work-related purposes.    No chest pain or shortness of breath.     Past Medical History:  Diagnosis Date   Blood in stool    Closed dislocation of left shoulder 03/2019   Dementia (HCC)    Esophageal reflux    Flatulence, eructation, and gas pain    Osteoarthrosis, unspecified whether generalized or localized, unspecified site    Unspecified hemorrhoids without mention of complication     Past Surgical History:  Procedure Laterality Date   ABDOMINAL HYSTERECTOMY     COLONOSCOPY  11/2010   UPPER GI ENDOSCOPY  11/2010    Family History  Problem Relation Age of Onset    Hypertension Mother    Diabetes Mother    Diabetes Sister    Diabetes Brother    Lung cancer Son    Diabetes Other    Colon cancer Neg Hx    Cancer Neg Hx    Early death Neg Hx    Heart disease Neg Hx    Hyperlipidemia Neg Hx    Kidney disease Neg Hx    Learning disabilities Neg Hx    Stroke Neg Hx     Social History   Socioeconomic History   Marital status: Widowed    Spouse name: Not on file   Number of children: 2   Years of education: 78   Highest education level: Not on file  Occupational History   Occupation: Retired    Associate Professor: RETIRED  Tobacco Use   Smoking status: Never   Smokeless tobacco: Never  Vaping Use   Vaping status: Never Used  Substance and Sexual Activity   Alcohol use: No    Alcohol/week: 0.0 standard drinks of alcohol   Drug use: No   Sexual activity: Not Currently  Other Topics Concern   Not on file  Social History Narrative   She is a retired from a tobacco company.   Her husband passed away in 2000/08/30.  She has two son.   Patient is right handed.   Her grandson lives with her.   Patient drinks 1 cup of caffeine daily   Social Drivers of Corporate investment banker Strain: Not on file  Food Insecurity: No Food Insecurity (08/25/2023)  Hunger Vital Sign    Worried About Running Out of Food in the Last Year: Never true    Ran Out of Food in the Last Year: Never true  Transportation Needs: No Transportation Needs (08/25/2023)   PRAPARE - Administrator, Civil Service (Medical): No    Lack of Transportation (Non-Medical): No  Physical Activity: Not on file  Stress: Not on file  Social Connections: Not on file  Intimate Partner Violence: Not At Risk (08/25/2023)   Humiliation, Afraid, Rape, and Kick questionnaire    Fear of Current or Ex-Partner: No    Emotionally Abused: No    Physically Abused: No    Sexually Abused: No    Outpatient Medications Prior to Visit  Medication Sig Dispense Refill   Blood Glucose Monitoring  Suppl DEVI 1 each by Does not apply route in the morning and at bedtime. May substitute to any manufacturer covered by patient's insurance. 1 each 0   docusate sodium  (COLACE) 100 MG capsule Take 1 capsule (100 mg total) by mouth every 12 (twelve) hours. 40 capsule 0   Glucose Blood (BLOOD GLUCOSE TEST STRIPS) STRP 1 each by In Vitro route 2 (two) times daily. May substitute to any manufacturer covered by patient's insurance. 100 strip 0   Lancet Device MISC 1 each by Does not apply route in the morning, at noon, and at bedtime. May substitute to any manufacturer covered by patient's insurance. 1 each 0   Lancets Misc. MISC 1 each by Does not apply route in the morning, at noon, and at bedtime. May substitute to any manufacturer covered by patient's insurance. 100 each 0   memantine  (NAMENDA  XR) 28 MG CP24 24 hr capsule Take 1 capsule (28 mg total) by mouth daily. 90 capsule 1   metoprolol  succinate (TOPROL  XL) 25 MG 24 hr tablet Take 1 tablet (25 mg total) by mouth daily. 90 tablet 1   polyethylene glycol (MIRALAX ) 17 g packet Take 17 g by mouth daily as needed. 14 each 0   traZODone  (DESYREL ) 50 MG tablet Take 1 tablet (50 mg total) by mouth at bedtime. 90 tablet 1   No facility-administered medications prior to visit.    No Known Allergies  ROS Review of Systems  Constitutional:  Negative for appetite change, chills, fatigue and fever.  Respiratory:  Negative for cough, shortness of breath and wheezing.   Cardiovascular:  Negative for chest pain, palpitations and leg swelling.  Gastrointestinal:  Negative for abdominal pain, constipation, nausea and vomiting.  Genitourinary:  Negative for difficulty urinating, dysuria, flank pain and frequency.  Musculoskeletal:  Negative for arthralgias, back pain and joint swelling.  Skin:  Negative for pallor, rash and wound.  Neurological:  Negative for facial asymmetry, weakness, numbness and headaches.  Psychiatric/Behavioral:  Positive for  behavioral problems. Negative for agitation. The patient is not nervous/anxious.       Objective:    Physical Exam Vitals and nursing note reviewed.  Constitutional:      General: She is not in acute distress.    Appearance: Normal appearance. She is not ill-appearing, toxic-appearing or diaphoretic.     Comments: Patient is alert and calm no focal motor deficits noted no sign of distress noted  Cardiovascular:     Rate and Rhythm: Normal rate and regular rhythm.     Pulses: Normal pulses.     Heart sounds: Normal heart sounds. No murmur heard.    No friction rub. No gallop.  Pulmonary:  Effort: Pulmonary effort is normal. No respiratory distress.     Breath sounds: Normal breath sounds. No stridor. No wheezing, rhonchi or rales.  Chest:     Chest wall: No tenderness.  Abdominal:     General: There is no distension.     Palpations: Abdomen is soft.     Tenderness: There is no abdominal tenderness. There is no right CVA tenderness, left CVA tenderness or guarding.  Musculoskeletal:        General: No swelling, tenderness, deformity or signs of injury.     Right lower leg: No edema.     Left lower leg: No edema.  Skin:    General: Skin is warm and dry.     Capillary Refill: Capillary refill takes 2 to 3 seconds.     Coloration: Skin is not jaundiced or pale.     Findings: No bruising, erythema or lesion.  Neurological:     Mental Status: She is alert. Mental status is at baseline.     Motor: No weakness.     Gait: Gait normal.     Comments: Patient is alert and calm no focal motor deficits  Psychiatric:        Mood and Affect: Mood normal.     BP 118/61   Pulse (!) 55   Temp (!) 97.3 F (36.3 C) (Oral)   Wt 108 lb (49 kg)   BMI 18.54 kg/m  Wt Readings from Last 3 Encounters:  07/26/24 108 lb (49 kg)  07/03/24 108 lb 9.6 oz (49.3 kg)  12/29/23 121 lb (54.9 kg)    Lab Results  Component Value Date   TSH 1.070 11/24/2020   Lab Results  Component Value Date    WBC 4.2 07/03/2024   HGB 13.7 07/03/2024   HCT 41.8 07/03/2024   MCV 89 07/03/2024   PLT 172 07/03/2024   Lab Results  Component Value Date   NA 145 (H) 07/03/2024   K 4.1 07/03/2024   CO2 24 07/03/2024   GLUCOSE 109 (H) 07/03/2024   BUN 12 07/03/2024   CREATININE 0.96 07/03/2024   BILITOT 0.4 07/03/2024   ALKPHOS 84 07/03/2024   AST 14 07/03/2024   ALT 8 07/03/2024   PROT 7.5 07/03/2024   ALBUMIN 4.2 07/03/2024   CALCIUM  9.8 07/03/2024   ANIONGAP 12 12/03/2023   EGFR 57 (L) 07/03/2024   GFR 79.53 03/06/2017   Lab Results  Component Value Date   CHOL 300 (H) 07/03/2024   Lab Results  Component Value Date   HDL 68 07/03/2024   Lab Results  Component Value Date   LDLCALC 215 (H) 07/03/2024   Lab Results  Component Value Date   TRIG 102 07/03/2024   Lab Results  Component Value Date   CHOLHDL 4.4 07/03/2024   Lab Results  Component Value Date   HGBA1C 5.7 (A) 07/03/2024      Assessment & Plan:  Assessment and Plan Assessment & Plan     Problem List Items Addressed This Visit       Nervous and Auditory   Dementia (HCC)    Dementia with medication adherence issues. Katelyn Sparks is primary caregiver. - Continue Memantine  28 MG oral daily. caregiver needs documentation for FMLA purposes. He will bring forms to the office for completion         Genitourinary   Acute cystitis without hematuria - Primary    Urinary tract infection, recently treated with fosfomycin 3 mg one-time dose in hospital. No further antibiotics  needed. - Ensure adequate hydration to prevent recurrence of UTI.        Other   Insomnia due to medical condition    Insomnia due to medical condition Insomnia managed with Trazodone   - Continue Trazodone  50 MG oral at bedtime.      Encounter for examination following treatment at hospital   Hospital chart reviewed, including discharge summary Medications reconciled and reviewed with the patient in detail        No orders  of the defined types were placed in this encounter.   Follow-up: No follow-ups on file.    Camreigh Michie R Ekansh Sherk, FNP

## 2024-07-26 NOTE — Transitions of Care (Post Inpatient/ED Visit) (Signed)
 07/26/2024  Name: Katelyn Sparks MRN: [id number] DOB: [birth year]  Today's TOC FU Call Status:   Patient's Name and Date of Birth confirmed.  Transition Care Management Follow-up Telephone Call Date of Discharge: 07/25/24 Discharge Facility: [facility] ([facility]) Type of Discharge: Emergency Department Reason for ED Visit: Other: How have you been since you were released from the hospital?: Better Any questions or concerns?: No  Items Reviewed: Did you receive and understand the discharge instructions provided?: Yes Medications obtained,verified, and reconciled?: No Any new allergies since your discharge?: No Dietary orders reviewed?: NA Do you have support at home?: Yes People in Home [RPT]: grandchild(ren) Name of Support/Comfort Primary Source: [place]  Medications Reviewed Today: Medications Reviewed Today     Reviewed by Starlene Charlynn BIRCH, CMA (Certified Medical Assistant) on 07/26/24 at [phone]  Med List Status: <None>   Medication Order Taking? Sig Documenting Provider Last Dose Status Informant  Blood Glucose Monitoring Suppl DEVI [id number] Yes 1 each by Does not apply route in the morning and at bedtime. May substitute to any manufacturer covered by patient's insurance. Oley Bascom RAMAN, NP  Active   docusate sodium  (COLACE) 100 MG capsule [id number] Yes Take 1 capsule (100 mg total) by mouth every 12 (twelve) hours. Steinl, Kevin, MD  Active   Glucose Blood (BLOOD GLUCOSE TEST STRIPS) STRP [id number] Yes 1 each by In Vitro route 2 (two) times daily. May substitute to any manufacturer covered by patient's insurance. Oley Bascom RAMAN, NP  Active   Lancet Device MISC [id number] Yes 1 each by Does not apply route in the morning, at noon, and at bedtime. May substitute to any manufacturer covered by patient's insurance. Oley Bascom RAMAN, NP  Active   Lancets Misc. MISC [id number] Yes 1 each by Does not apply route in the morning, at noon, and at bedtime. May substitute to any  manufacturer covered by patient's insurance. Oley Bascom RAMAN, NP  Active   memantine  (NAMENDA  XR) 28 MG CP24 24 hr capsule [id number] Yes Take 1 capsule (28 mg total) by mouth daily. Paseda, Folashade R, FNP  Active   metoprolol  succinate (TOPROL  XL) 25 MG 24 hr tablet [id number] Yes Take 1 tablet (25 mg total) by mouth daily. Paseda, Folashade R, FNP  Active    Patient not taking:   Discontinued 05/31/20 1550 polyethylene glycol (MIRALAX ) 17 g packet [id number] Yes Take 17 g by mouth daily as needed. Steinl, Kevin, MD  Active   traZODone  (DESYREL ) 50 MG tablet [id number] Yes Take 1 tablet (50 mg total) by mouth at bedtime. Paseda, Folashade R, FNP  Active   Med List Note Marisa Nathanel SAILOR, CPhT 03/30/19 2142): Cedric Novitski (Son who lives with the patient) [phone]            Home Care and Equipment/Supplies: Were Home Health Services Ordered?: NA Any new equipment or medical supplies ordered?: NA  Functional Questionnaire: Do you need assistance with bathing/showering or dressing?: No Do you need assistance with meal preparation?: No Do you need assistance with eating?: No Do you have difficulty maintaining continence: No Do you need assistance with getting out of bed/getting out of a chair/moving?: No Do you have difficulty managing or taking your medications?: No  Follow up appointments reviewed: PCP Follow-up appointment confirmed?: Yes Date of PCP follow-up appointment?: 07/26/24 Follow-up Provider: [facility] Follow-up appointment confirmed?: NA Do you need transportation to your follow-up appointment?: No Do you understand care options if your condition(s) worsen?: Yes-patient verbalized understanding    SIGNATURE  [employer], RMA

## 2024-07-30 ENCOUNTER — Other Ambulatory Visit

## 2024-07-30 ENCOUNTER — Telehealth: Payer: Self-pay

## 2024-07-30 NOTE — Transitions of Care (Post Inpatient/ED Visit) (Signed)
   07/30/2024  Name: ARACELIS ULREY MRN: [id number] DOB: 03/10/1935  Today's TOC FU Call Status: Today's TOC FU Call Status:: Unsuccessful Call (1st Attempt) Unsuccessful Call (1st Attempt) Date: 07/30/24  Attempted to reach the patient regarding the most recent Inpatient/ED visit.  Follow Up Plan: Additional outreach attempts will be made to reach the patient to complete the Transitions of Care (Post Inpatient/ED visit) call.   Signature  [employer], [place]

## 2024-07-30 NOTE — Patient Outreach (Signed)
 LCSW attempted to call patient's son Franky at scheduled and was unable to reach him and could leave voice mail. LCSW will send referral back to scheduling care guide to reschedule.  Olam Ally, MSW, LCSW [place]  [facility], [facility] Licensed Clinical Social Worker Direct Dial: [phone] it

## 2024-08-05 ENCOUNTER — Ambulatory Visit: Payer: Self-pay | Admitting: Nurse Practitioner

## 2024-08-05 ENCOUNTER — Telehealth: Payer: Self-pay | Admitting: *Deleted

## 2024-08-05 NOTE — Progress Notes (Signed)
 Complex Care Management Care Guide Note  08/05/2024 Name: Katelyn Sparks MRN: [id number] DOB: 1935-11-02  [name] is a 89 y.o. year old female who is a primary care patient of Oley Bascom RAMAN, NP and is actively engaged with the care management team. I reached out to [employer] by phone today to assist with re-scheduling  with the Licensed Clinical Child psychotherapist.  Follow up plan: Telephone appointment with complex care management team member scheduled for:  08/22/24  Harlene Satterfield  [facility]  [facility], [facility] Guide  Direct Dial: [phone]  Fax [phone]

## 2024-08-05 NOTE — Progress Notes (Signed)
 Complex Care Management Care Guide Note  08/05/2024 Name: Katelyn Sparks MRN: [id number] DOB: May 15, 1935  Katelyn Sparks is a 89 y.o. year old female who is a primary care patient of Oley Bascom RAMAN, NP and is actively engaged with the care management team. I reached out to Katelyn Sparks by phone today to assist with re-scheduling  with the Licensed Clinical Child psychotherapist.  Follow up plan: Unsuccessful telephone outreach attempt made.   Katelyn Sparks  [facility]  [facility], [facility] Guide  Direct Dial: [phone]  Fax [phone]

## 2024-08-06 ENCOUNTER — Telehealth: Payer: Self-pay

## 2024-08-06 NOTE — Telephone Encounter (Signed)
 error

## 2024-08-13 ENCOUNTER — Ambulatory Visit: Payer: Self-pay

## 2024-08-13 NOTE — Telephone Encounter (Signed)
 Pt want something for UTI. KH

## 2024-08-13 NOTE — Telephone Encounter (Signed)
 FYI Only or Action Required?: Action required by provider: request UTI.   Patient was last seen in primary care on 07/26/2024 by Paseda, Folashade R, FNP.   Called Nurse Triage reporting Dysuria.   Symptoms began 07/25/2024.   Interventions attempted: Prescription medications: ABX.   Symptoms are: gradually worsening.   Triage Disposition: See Physician Within 24 Hours   Patient/caregiver understands and will follow disposition?: No, wishes to speak with PCP     Copied from CRM #[id number]. Topic: Clinical - Red Word Triage >> Aug 13, 2024  8:41 AM Treva T wrote: [facility] that prompted transfer to Nurse Triage: Recevied call from patient son, Franky, states caregiver reports that patient may still have a UTI. Patient was recently seen at facility on 07/25/24, and treated at hospital urgent care, however caregiver states she still thinks she still has a UTI.     Symptoms patient having, Strong Vaginal odor, vaginal irritation, and some pain associated wihen urianting.     Reason for Disposition  Unusual vaginal discharge (e.g., bad smelling, yellow, green, or foamy-white)   UTI it improved lightly with ABX but now s/s are back where they previously were.  Answer Assessment - Initial Assessment Questions 1. SEVERITY: How bad is the pain?  (e.g., Scale 1-10; mild, moderate, or severe)     moderate 2. FREQUENCY: How many times have you had painful urination today?      unknown 3. PATTERN: Is pain present every time you urinate or just sometimes?      constant 4. ONSET: When did the painful urination start?      07/25/2024 5. FEVER: Do you have a fever? If Yes, ask: What is your temperature, how was it measured, and when did it start?     no 6. PAST UTI: Have you had a urine infection before? If Yes, ask: When was the last time? and What happened that time?      Yest - abx 7. CAUSE: What do you think is causing the painful urination?  (e.g., UTI, scratch, Herpes  sore)     UTI 8. OTHER SYMPTOMS: Do you have any other symptoms? (e.g., blood in urine, flank pain, genital sores, urgency, vaginal discharge)     Back pain, urgency, order 9. PREGNANCY: Is there any chance you are pregnant? When was your last menstrual period?     Na   Pt's son called to request ABX for UTI: stated pt had UTI on 07/25/2024: was given medication at the hospital for this - however son stated s/s never went away.  Protocols used: Abdominal Pain - Female-A-AH, Urination Pain - Female-A-AH

## 2024-08-13 NOTE — Telephone Encounter (Deleted)
 FYI Only or Action Required?: Action required by provider: request UTI.  Patient was last seen in primary care on 07/26/2024 by Paseda, Folashade R, FNP.  Called Nurse Triage reporting Dysuria.  Symptoms began 07/25/2024.  Interventions attempted: Prescription medications: ABX.  Symptoms are: gradually worsening.  Triage Disposition: See Physician Within 24 Hours  Patient/caregiver understands and will follow disposition?: No, wishes to speak with PCP   Copied from CRM #[id number]. Topic: Clinical - Red Word Triage >> Aug 13, 2024  8:41 AM Treva T wrote: [facility] that prompted transfer to Nurse Triage: Recevied call from patient son, Franky, states caregiver reports that patient may still have a UTI. Patient was recently seen at facility on 07/25/24, and treated at hospital urgent care, however caregiver states she still thinks she still has a UTI.    Symptoms patient having, Strong Vaginal odor, vaginal irritation, and some pain associated wihen urianting. Reason for Disposition  Unusual vaginal discharge (e.g., bad smelling, yellow, green, or foamy-white)  Answer Assessment - Initial Assessment Questions 1. LOCATION: Where does it hurt?      Burning with urination 2. RADIATION: Does the pain shoot anywhere else? (e.g., chest, back)     *No Answer* 3. ONSET: When did the pain begin? (e.g., minutes, hours or days ago)      07/25/2024 4. SUDDEN: Gradual or sudden onset?     *No Answer* 5. PATTERN Does the pain come and go, or is it constant?     *No Answer* 6. SEVERITY: How bad is the pain?  (e.g., Scale 1-10; mild, moderate, or severe)     *No Answer* 7. RECURRENT SYMPTOM: Have you ever had this type of stomach pain before? If Yes, ask: When was the last time? and What happened that time?      *No Answer* 8. CAUSE: What do you think is causing the stomach pain? (e.g., gallstones, recent abdominal surgery)     *No Answer* 9. RELIEVING/AGGRAVATING FACTORS: What  makes it better or worse? (e.g., antacids, bending or twisting motion, bowel movement)     *No Answer* 10. OTHER SYMPTOMS: Do you have any other symptoms? (e.g., back pain, diarrhea, fever, urination pain, vomiting)       Urination pain, frequency, urgency, vaginal odor  11. PREGNANCY: Is there any chance you are pregnant? When was your last menstrual period?       na  UTI it improved lightly but now s/s are back where they previously were.  Answer Assessment - Initial Assessment Questions 1. SEVERITY: How bad is the pain?  (e.g., Scale 1-10; mild, moderate, or severe)     moderate 2. FREQUENCY: How many times have you had painful urination today?      unknown 3. PATTERN: Is pain present every time you urinate or just sometimes?      constant 4. ONSET: When did the painful urination start?      07/25/2024 5. FEVER: Do you have a fever? If Yes, ask: What is your temperature, how was it measured, and when did it start?     no 6. PAST UTI: Have you had a urine infection before? If Yes, ask: When was the last time? and What happened that time?      Yest - abx 7. CAUSE: What do you think is causing the painful urination?  (e.g., UTI, scratch, Herpes sore)     UTI 8. OTHER SYMPTOMS: Do you have any other symptoms? (e.g., blood in urine, flank pain, genital sores, urgency, vaginal discharge)  Back pain, urgency, order 9. PREGNANCY: Is there any chance you are pregnant? When was your last menstrual period?     Na   Pt's son called to request ABX for UTI: stated pt had UTI on 07/25/2024: was given medication at the hospital for this - however son stated s/s never went away.  Protocols used: Abdominal Pain - Female-A-AH, Urination Pain - Female-A-AH

## 2024-08-15 ENCOUNTER — Ambulatory Visit: Payer: Self-pay

## 2024-08-15 NOTE — Telephone Encounter (Signed)
 FYI Only or Action Required?: FYI only for provider.  Patient was last seen in primary care on 07/26/2024 by Paseda, Folashade R, FNP.  Called Nurse Triage reporting urinary issues.  Symptoms began several days ago.  Interventions attempted: Prescription medications: son would not give medication names.  Symptoms are: gradually worsening.  Triage Disposition: See Physician Within 24 Hours  Patient/caregiver understands and will follow disposition?: Yes      Copied from CRM [id number]. Topic: Clinical - Red Word Triage >> Aug 15, 2024  9:29 AM Marissa P wrote: Red Word that prompted transfer to Nurse Triage: Son called Franky. Patient still has pain etc symptoms of the uti and wasn't given a prescription from urgent care. Needs some help getting rid of it. Reason for Disposition  Urinating more frequently than usual (i.e., frequency) OR new-onset of the feeling of an urgent need to urinate (i.e., urgency)  Answer Assessment - Initial Assessment Questions 1. SYMPTOM: What's the main symptom you're concerned about? (e.g., frequency, incontinence)     Pain, odor, and requested prescription for an antibiotic 2. ONSET: When did the  pain  start?     Few days ago 3. PAIN: Is there any pain? If Yes, ask: How bad is it? (Scale: 1-10; mild, moderate, severe)     Yes, son is on the phone and is upset because antibiotic was not called in. 4. CAUSE: What do you think is causing the symptoms?     Declined to answer 5. OTHER SYMPTOMS: Do you have any other symptoms? (e.g., blood in urine, fever, flank pain, pain with urination)     Declined to answer 6. PREGNANCY: Is there any chance you are pregnant? When was your last menstrual period?     na  Protocols used: Urinary Symptoms-A-AH

## 2024-08-15 NOTE — Telephone Encounter (Signed)
Patient not at this office

## 2024-08-15 NOTE — Telephone Encounter (Signed)
 Patient have appt. 08/16/24 at 1pm

## 2024-08-16 ENCOUNTER — Encounter: Payer: Self-pay | Admitting: Family

## 2024-08-16 ENCOUNTER — Ambulatory Visit (INDEPENDENT_AMBULATORY_CARE_PROVIDER_SITE_OTHER): Admitting: Family

## 2024-08-16 ENCOUNTER — Other Ambulatory Visit (HOSPITAL_COMMUNITY)
Admission: RE | Admit: 2024-08-16 | Discharge: 2024-08-16 | Disposition: A | Discharge status: Home / Self Care | Source: Ambulatory Visit | Attending: Family | Admitting: Family

## 2024-08-16 VITALS — BP 114/71 | HR 59 | Temp 97.6°F | Resp 16 | Ht 64.0 in | Wt 107.0 lb

## 2024-08-16 DIAGNOSIS — R399 Unspecified symptoms and signs involving the genitourinary system: Secondary | ICD-10-CM | POA: Insufficient documentation

## 2024-08-16 NOTE — Progress Notes (Signed)
Patient was unable to give urine sample today.

## 2024-08-16 NOTE — Progress Notes (Signed)
 Patient is unable to answer PHQ-9 or GAD -7 questions

## 2024-08-16 NOTE — Progress Notes (Signed)
 Patient ID: Katelyn Sparks, female    DOB: 1935-09-08  MRN: [id number]  CC: UTI  Subjective: Katelyn Sparks is a 89 y.o. female who presents for UTI. Patient is accompanied by caregiver.  Her concerns today include:  - Caregiver states patient was recently seen at Emergency Department for UTI and given medication which did not help. Today caregiver states patient has urine odor, holding urination, and pain with urination.  - Caregiver states primary provider Bascom Borer, NP manages dementia/balance and additional chronic conditions. Caregiver plans to schedule patient a follow-up appointment soon for additional information related to home health.  Patient Active Problem List   Diagnosis Date Noted   Acute cystitis without hematuria 07/26/2024   Encounter for examination following treatment at hospital 07/26/2024   Abnormal urine findings 12/29/2023   Insomnia 12/29/2023   Hospital discharge follow-up 09/01/2023   Hypokalemia 09/01/2023   HOCM (hypertrophic obstructive cardiomyopathy) (HCC) 08/26/2023   Syncope 08/26/2023   Syncope and collapse 08/23/2023   Sundowning 12/10/2019   Agitation due to dementia (HCC) 12/10/2019   Rash of both hands 12/10/2019   Pre-diabetes 10/02/2019   Memory changes 10/02/2019   Anxiety 10/02/2019   Toenail fungus 10/02/2019   History of recent fall 04/04/2019   Insomnia due to medical condition 08/20/2014   Dementia (HCC) 08/20/2014   Chronic constipation 11/12/2013   Hyperglycemia 08/28/2013   Hyperlipidemia with target LDL less than 130 08/28/2013   Other screening mammogram 08/28/2013   Osteopenia 08/28/2013   Routine general medical examination at a health care facility 08/28/2013   GERD without esophagitis 07/26/2010     Current Outpatient Medications on File Prior to Visit  Medication Sig Dispense Refill   Blood Glucose Monitoring Suppl DEVI 1 each by Does not apply route in the morning and at bedtime. May substitute to any  manufacturer covered by patient's insurance. 1 each 0   Glucose Blood (BLOOD GLUCOSE TEST STRIPS) STRP 1 each by In Vitro route 2 (two) times daily. May substitute to any manufacturer covered by patient's insurance. 100 strip 0   memantine  (NAMENDA  XR) 28 MG CP24 24 hr capsule Take 1 capsule (28 mg total) by mouth daily. 90 capsule 1   metoprolol  succinate (TOPROL  XL) 25 MG 24 hr tablet Take 1 tablet (25 mg total) by mouth daily. 90 tablet 1   polyethylene glycol (MIRALAX ) 17 g packet Take 17 g by mouth daily as needed. 14 each 0   traZODone  (DESYREL ) 50 MG tablet Take 1 tablet (50 mg total) by mouth at bedtime. 90 tablet 1   docusate sodium  (COLACE) 100 MG capsule Take 1 capsule (100 mg total) by mouth every 12 (twelve) hours. (Patient not taking: Reported on 08/16/2024) 40 capsule 0   [DISCONTINUED] omeprazole  (PRILOSEC) 40 MG capsule Take 1 capsule (40 mg total) by mouth daily. (Patient not taking: Reported on 12/10/2019) 90 capsule 3   No current facility-administered medications on file prior to visit.    No Known Allergies  Social History   Socioeconomic History   Marital status: Widowed    Spouse name: Not on file   Number of children: 2   Years of education: 79   Highest education level: Not on file  Occupational History   Occupation: Retired    Associate Professor: RETIRED  Tobacco Use   Smoking status: Never   Smokeless tobacco: Never  Vaping Use   Vaping status: Never Used  Substance and Sexual Activity   Alcohol use: No  Alcohol/week: 0.0 standard drinks of alcohol   Drug use: No   Sexual activity: Not Currently  Other Topics Concern   Not on file  Social History Narrative   She is a retired from a tobacco company.   Her husband passed away in 09/01/2000.  She has two son.   Patient is right handed.   Her grandson lives with her.   Patient drinks 1 cup of caffeine daily   Social Drivers of Corporate investment banker Strain: Not on file  Food Insecurity: No Food Insecurity  (08/25/2023)   Hunger Vital Sign    Worried About Running Out of Food in the Last Year: Never true    Ran Out of Food in the Last Year: Never true  Transportation Needs: No Transportation Needs (08/25/2023)   PRAPARE - Administrator, Civil Service (Medical): No    Lack of Transportation (Non-Medical): No  Physical Activity: Not on file  Stress: Not on file  Social Connections: Not on file  Intimate Partner Violence: Not At Risk (08/25/2023)   Humiliation, Afraid, Rape, and Kick questionnaire    Fear of Current or Ex-Partner: No    Emotionally Abused: No    Physically Abused: No    Sexually Abused: No    Family History  Problem Relation Age of Onset   Hypertension Mother    Diabetes Mother    Diabetes Sister    Diabetes Brother    Lung cancer Son    Diabetes Other    Colon cancer Neg Hx    Cancer Neg Hx    Early death Neg Hx    Heart disease Neg Hx    Hyperlipidemia Neg Hx    Kidney disease Neg Hx    Learning disabilities Neg Hx    Stroke Neg Hx     Past Surgical History:  Procedure Laterality Date   ABDOMINAL HYSTERECTOMY     COLONOSCOPY  11/2010   UPPER GI ENDOSCOPY  11/2010    ROS: Review of Systems Negative except as stated above  PHYSICAL EXAM: BP 114/71   Pulse (!) 59   Temp 97.6 F (36.4 C) (Oral)   Resp 16   Ht 5' 4 (1.626 m)   Wt 107 lb (48.5 kg)   SpO2 94%   BMI 18.37 kg/m   Physical Exam HENT:     Head: Normocephalic and atraumatic.     Nose: Nose normal.     Mouth/Throat:     Mouth: Mucous membranes are moist.     Pharynx: Oropharynx is clear.  Eyes:     Extraocular Movements: Extraocular movements intact.     Conjunctiva/sclera: Conjunctivae normal.     Pupils: Pupils are equal, round, and reactive to light.  Cardiovascular:     Rate and Rhythm: Bradycardia present.     Pulses: Normal pulses.     Heart sounds: Normal heart sounds.  Pulmonary:     Effort: Pulmonary effort is normal.     Breath sounds: Normal breath  sounds.  Abdominal:     General: Bowel sounds are normal.     Palpations: Abdomen is soft.  Musculoskeletal:        General: Normal range of motion.     Cervical back: Normal range of motion and neck supple.  Neurological:     General: No focal deficit present.     Mental Status: She is alert and oriented to person, place, and time.  Psychiatric:  Mood and Affect: Mood normal.        Behavior: Behavior normal.    ASSESSMENT AND PLAN: 1. Lower urinary tract symptoms (LUTS) (Primary) - Routine screening.  - POCT URINALYSIS DIP (CLINITEK); Future - Urine Culture - Cervicovaginal ancillary only   Patient was given the opportunity to ask questions.  Patient verbalized understanding of the plan and was able to repeat key elements of the plan. Patient was given clear instructions to go to Emergency Department or return to medical center if symptoms don't improve, worsen, or new problems develop.The patient verbalized understanding.   Orders Placed This Encounter  Procedures   Urine Culture   POCT URINALYSIS DIP (CLINITEK)    Return for Follow-up as needed with Bascom Borer, NP.  Greig JINNY Drones, NP

## 2024-08-16 NOTE — Progress Notes (Signed)
 Patient balance is really off, patient may not be able to give urine sample,patient wants more information on getting more home health because her dementia is getting worst, patient keeps having recurrent UTI

## 2024-08-17 ENCOUNTER — Other Ambulatory Visit: Payer: Self-pay

## 2024-08-17 ENCOUNTER — Emergency Department (HOSPITAL_COMMUNITY)
Admission: EM | Admit: 2024-08-17 | Discharge: 2024-08-17 | Disposition: A | Discharge status: Home / Self Care | Attending: Emergency Medicine | Admitting: Emergency Medicine

## 2024-08-17 DIAGNOSIS — F0390 Unspecified dementia without behavioral disturbance: Secondary | ICD-10-CM | POA: Diagnosis not present

## 2024-08-17 DIAGNOSIS — R630 Anorexia: Secondary | ICD-10-CM | POA: Diagnosis present

## 2024-08-17 LAB — URINALYSIS, W/ REFLEX TO CULTURE (INFECTION SUSPECTED)
Bacteria, UA: NONE SEEN
Bilirubin Urine: NEGATIVE
Glucose, UA: NEGATIVE mg/dL
Hgb urine dipstick: NEGATIVE
Ketones, ur: NEGATIVE mg/dL
Nitrite: NEGATIVE
Protein, ur: NEGATIVE mg/dL
Specific Gravity, Urine: 1.023 (ref 1.005–1.030)
pH: 5 (ref 5.0–8.0)

## 2024-08-17 LAB — CBC
HCT: 34.9 % — ABNORMAL LOW (ref 36.0–46.0)
Hemoglobin: 12.3 g/dL (ref 12.0–15.0)
MCH: 30 pg (ref 26.0–34.0)
MCHC: 35.2 g/dL (ref 30.0–36.0)
MCV: 85.1 fL (ref 80.0–100.0)
Platelets: 173 K/uL (ref 150–400)
RBC: 4.1 MIL/uL (ref 3.87–5.11)
RDW: 14.9 % (ref 11.5–15.5)
WBC: 4.7 K/uL (ref 4.0–10.5)
nRBC: 0 % (ref 0.0–0.2)

## 2024-08-17 LAB — BASIC METABOLIC PANEL WITH GFR
Anion gap: 13 (ref 5–15)
BUN: 18 mg/dL (ref 8–23)
CO2: 22 mmol/L (ref 22–32)
Calcium: 8.9 mg/dL (ref 8.9–10.3)
Chloride: 102 mmol/L (ref 98–111)
Creatinine, Ser: 0.91 mg/dL (ref 0.44–1.00)
GFR, Estimated: >60 mL/min (ref >60)
Glucose, Bld: 121 mg/dL — ABNORMAL HIGH (ref 70–99)
Potassium: 4.1 mmol/L (ref 3.5–5.1)
Sodium: 137 mmol/L (ref 135–145)

## 2024-08-17 MED ORDER — PANTOPRAZOLE SODIUM 20 MG PO TBEC: 20.0000 mg | DELAYED_RELEASE_TABLET | Freq: Every day | ORAL | 0 refills | Status: AC

## 2024-08-17 MED ORDER — LACTATED RINGERS IV BOLUS
500.0000 mL | Freq: Once | INTRAVENOUS | Status: AC
  Administered 2024-08-17: 500 mL via INTRAVENOUS

## 2024-08-17 NOTE — ED Notes (Signed)
 NT attempted to get Pt's O2 Sat 3 times. Pt's hands are cold and will not read above 72%. Pt is breathing normally and does not show signs of distress.

## 2024-08-17 NOTE — ED Notes (Signed)
 Attempted in and out cath, no urine return Randol MD notified

## 2024-08-17 NOTE — ED Provider Notes (Signed)
 [facility] AT [facility] Provider Note   CSN: [id number] Arrival date & time: 08/17/24  1620     Patient presents with: UTI   Katelyn Sparks is a 89 y.o. female.   HPI   Patient has a history of dementia osteoarthritis urinary tract infection.  Family states that she was diagnosed with UTI few weeks ago.  They are not sure it has cleared up.  Patient has a caregiver assist her when she goes to the restroom and apparently when she is urinating she seems to be having some pain and discomfort.  They have heard her grunt or groan when urinating.  Patient herself denies any complaints but the son states she has some memory issues.  They also report some decreased appetite recently.  There is not been any vomiting or diarrhea.  No fevers.  They tried to get her urine checked at the doctor's office on Friday but she was not able to give a sample.  Family states they were going out of town so they brought her to the ED to get checked before they left  Prior to Admission medications   Medication Sig Start Date End Date Taking? Authorizing Provider  pantoprazole  (PROTONIX ) 20 MG tablet Take 1 tablet (20 mg total) by mouth daily. 08/17/24  Yes Randol Simmonds, MD  Blood Glucose Monitoring Suppl DEVI 1 each by Does not apply route in the morning and at bedtime. May substitute to any manufacturer covered by patient's insurance. 07/03/24   Oley Bascom RAMAN, NP  docusate sodium  (COLACE) 100 MG capsule Take 1 capsule (100 mg total) by mouth every 12 (twelve) hours. Patient not taking: Reported on 08/16/2024 12/03/23   Steinl, Kevin, MD  Glucose Blood (BLOOD GLUCOSE TEST STRIPS) STRP 1 each by In Vitro route 2 (two) times daily. May substitute to any manufacturer covered by patient's insurance. 07/03/24 08/22/24  Oley Bascom RAMAN, NP  memantine  (NAMENDA  XR) 28 MG CP24 24 hr capsule Take 1 capsule (28 mg total) by mouth daily. 12/29/23   Paseda, Folashade R, FNP  metoprolol  succinate  (TOPROL  XL) 25 MG 24 hr tablet Take 1 tablet (25 mg total) by mouth daily. 12/29/23   Paseda, Folashade R, FNP  polyethylene glycol (MIRALAX ) 17 g packet Take 17 g by mouth daily as needed. 12/03/23   Bernard Drivers, MD  traZODone  (DESYREL ) 50 MG tablet Take 1 tablet (50 mg total) by mouth at bedtime. 12/29/23   Paseda, Folashade R, FNP  omeprazole  (PRILOSEC) 40 MG capsule Take 1 capsule (40 mg total) by mouth daily. Patient not taking: Reported on 12/10/2019 10/17/18 05/31/20  Stroud, Natalie M, FNP    Allergies: Patient has no known allergies.    Review of Systems  Updated Vital Signs BP (!) 175/61 (BP Location: Right Arm)   Pulse 68   Temp 97.7 F (36.5 C) (Oral)   Resp (!) 21   Ht 1.626 m (5' 4)   Wt 48.5 kg   SpO2 93%   BMI 18.37 kg/m   Physical Exam Vitals and nursing note reviewed.  Constitutional:      Appearance: She is well-developed. She is not diaphoretic.  HENT:     Head: Normocephalic and atraumatic.     Right Ear: External ear normal.     Left Ear: External ear normal.  Eyes:     General: No scleral icterus.       Right eye: No discharge.        Left eye:  No discharge.     Conjunctiva/sclera: Conjunctivae normal.  Neck:     Trachea: No tracheal deviation.  Cardiovascular:     Rate and Rhythm: Normal rate and regular rhythm.  Pulmonary:     Effort: Pulmonary effort is normal. No respiratory distress.     Breath sounds: Normal breath sounds. No stridor. No wheezing or rales.  Abdominal:     General: Bowel sounds are normal. There is no distension.     Palpations: Abdomen is soft.     Tenderness: There is no abdominal tenderness. There is no guarding or rebound.  Musculoskeletal:        General: No tenderness or deformity.     Cervical back: Neck supple.  Skin:    General: Skin is warm and dry.     Findings: No rash.  Neurological:     General: No focal deficit present.     Mental Status: She is alert.     Cranial Nerves: No cranial nerve deficit,  dysarthria or facial asymmetry.     Sensory: No sensory deficit.     Motor: No abnormal muscle tone or seizure activity.     Coordination: Coordination normal.  Psychiatric:        Mood and Affect: Mood normal.     (all labs ordered are listed, but only abnormal results are displayed) Labs Reviewed  CBC - Abnormal; Notable for the following components:      Result Value   HCT 34.9 (*)    All other components within normal limits  BASIC METABOLIC PANEL WITH GFR - Abnormal; Notable for the following components:   Glucose, Bld 121 (*)    All other components within normal limits  URINALYSIS, W/ REFLEX TO CULTURE (INFECTION SUSPECTED) - Abnormal; Notable for the following components:   Leukocytes,Ua TRACE (*)    All other components within normal limits    EKG: None  Radiology: No results found.   Procedures   Medications Ordered in the ED  lactated ringers  bolus 500 mL (0 mLs Intravenous Stopped 08/17/24 1935)    Clinical Course as of 08/17/24 2048  Sat Aug 17, 2024  1825 CBC(!) nl [JK]  1940 Basic metabolic panel(!) Metabolic panel normal [JK]  2031 Color, Urine: YELLOW Urinalysis not suggestive of UTI [JK]    Clinical Course User Index [JK] Randol Simmonds, MD                                 Medical Decision Making Problems Addressed: Decreased appetite: acute illness or injury that poses a threat to life or bodily functions  Amount and/or Complexity of Data Reviewed Labs: ordered. Decision-making details documented in ED Course.   Patient presented to the ER for evaluation of possible UTI.  Family states she was diagnosed with UTI recently and they were concerned that this could still be going on.  The patient has dementia and has some difficulty expressing herself.  Family also noticed that she seemed to be grimacing at times when she is eating.  Unclear if she is having some pain with swallowing or having oropharyngeal pain.  On exam patient does not have any  obvious oropharyngeal lesions.  Her laboratory tests are reassuring.  No signs of dehydration.  Urinalysis does not suggest UTI.  Will try a course of antacids.  Recommend outpatient follow-up with her PCP.  Could also consider a dental evaluation as an outpatient  Final diagnoses:  Decreased appetite    ED Discharge Orders          Ordered    pantoprazole  (PROTONIX ) 20 MG tablet  Daily        08/17/24 2046               Randol Simmonds, MD 08/17/24 2048

## 2024-08-17 NOTE — ED Triage Notes (Signed)
 Patient family reports patient seen for UTI 3 weeks ago, unsure if it resolved d/t malodorous urine, grimacing while urinating, eating and drinking less. Patient went to dr on Friday and patient was unable to provide sample.

## 2024-08-17 NOTE — Discharge Instructions (Addendum)
 Take over-the-counter Tylenol as needed for pain and discomfort.    I have also written a prescription for an antacid medication in case she is having some acid reflux that is causing some of the discomfort with eating and drinking.  Follow-up with her doctor next week to be rechecked to see if the symptoms are improving

## 2024-08-19 ENCOUNTER — Ambulatory Visit: Payer: Self-pay | Admitting: Family

## 2024-08-19 LAB — CERVICOVAGINAL ANCILLARY ONLY
Bacterial Vaginitis (gardnerella): NEGATIVE
Candida Glabrata: NEGATIVE
Candida Vaginitis: NEGATIVE
Chlamydia: NEGATIVE
Comment: NEGATIVE
Comment: NEGATIVE
Comment: NEGATIVE
Comment: NEGATIVE
Comment: NEGATIVE
Comment: NORMAL
Neisseria Gonorrhea: NEGATIVE
Trichomonas: NEGATIVE

## 2024-08-20 ENCOUNTER — Telehealth: Payer: Self-pay

## 2024-08-20 NOTE — Transitions of Care (Post Inpatient/ED Visit) (Signed)
   08/20/2024  Name: Katelyn Sparks MRN: [id number] DOB: May 24, 1935  Today's TOC FU Call Status: Today's TOC FU Call Status:: Unsuccessful Call (1st Attempt) Unsuccessful Call (1st Attempt) Date: 08/20/24  Attempted to reach the patient regarding the most recent Inpatient/ED visit.  Follow Up Plan: Additional outreach attempts will be made to reach the patient to complete the Transitions of Care (Post Inpatient/ED visit) call.   Signature  [employer], [place]

## 2024-08-21 NOTE — Progress Notes (Signed)
 Hi Carlie,  Pt has scheduled appt with LCSW tomorrow no need for reschedule closed out RS.  Thank you, Shereen Gin [facility] Assistant Direct Dial: [phone]  Fax: [phone] Website: [email or web address]

## 2024-08-22 ENCOUNTER — Telehealth: Admitting: Licensed Clinical Social Worker

## 2024-08-22 ENCOUNTER — Other Ambulatory Visit: Payer: Self-pay | Admitting: Licensed Clinical Social Worker

## 2024-08-24 ENCOUNTER — Encounter (HOSPITAL_COMMUNITY): Payer: Self-pay

## 2024-08-24 ENCOUNTER — Emergency Department (HOSPITAL_COMMUNITY)

## 2024-08-24 ENCOUNTER — Other Ambulatory Visit: Payer: Self-pay

## 2024-08-24 ENCOUNTER — Emergency Department (HOSPITAL_COMMUNITY)
Admission: EM | Admit: 2024-08-24 | Discharge: 2024-08-24 | Disposition: A | Discharge status: Home / Self Care | Attending: Emergency Medicine | Admitting: Emergency Medicine

## 2024-08-24 DIAGNOSIS — I444 Left anterior fascicular block: Secondary | ICD-10-CM | POA: Insufficient documentation

## 2024-08-24 DIAGNOSIS — I440 Atrioventricular block, first degree: Secondary | ICD-10-CM | POA: Insufficient documentation

## 2024-08-24 DIAGNOSIS — K5730 Diverticulosis of large intestine without perforation or abscess without bleeding: Secondary | ICD-10-CM | POA: Insufficient documentation

## 2024-08-24 DIAGNOSIS — W19XXXA Unspecified fall, initial encounter: Secondary | ICD-10-CM | POA: Insufficient documentation

## 2024-08-24 DIAGNOSIS — F0390 Unspecified dementia without behavioral disturbance: Secondary | ICD-10-CM | POA: Insufficient documentation

## 2024-08-24 DIAGNOSIS — I700 Atherosclerosis of aorta: Secondary | ICD-10-CM | POA: Insufficient documentation

## 2024-08-24 DIAGNOSIS — I10 Essential (primary) hypertension: Secondary | ICD-10-CM | POA: Diagnosis not present

## 2024-08-24 DIAGNOSIS — I959 Hypotension, unspecified: Secondary | ICD-10-CM | POA: Diagnosis present

## 2024-08-24 LAB — CBC WITH DIFFERENTIAL/PLATELET
Abs Immature Granulocytes: 0.03 K/uL (ref 0.00–0.07)
Basophils Absolute: 0 K/uL (ref 0.0–0.1)
Basophils Relative: 0 %
Eosinophils Absolute: 0 K/uL (ref 0.0–0.5)
Eosinophils Relative: 0 %
HCT: 35.7 % — ABNORMAL LOW (ref 36.0–46.0)
Hemoglobin: 12.3 g/dL (ref 12.0–15.0)
Immature Granulocytes: 0 %
Lymphocytes Relative: 14 %
Lymphs Abs: 1.1 K/uL (ref 0.7–4.0)
MCH: 29.6 pg (ref 26.0–34.0)
MCHC: 34.5 g/dL (ref 30.0–36.0)
MCV: 85.8 fL (ref 80.0–100.0)
Monocytes Absolute: 0.4 K/uL (ref 0.1–1.0)
Monocytes Relative: 4 %
Neutro Abs: 6.7 K/uL (ref 1.7–7.7)
Neutrophils Relative %: 82 %
Platelets: 188 K/uL (ref 150–400)
RBC: 4.16 MIL/uL (ref 3.87–5.11)
RDW: 14.8 % (ref 11.5–15.5)
WBC: 8.2 K/uL (ref 4.0–10.5)
nRBC: 0 % (ref 0.0–0.2)

## 2024-08-24 LAB — RESP PANEL BY RT-PCR (RSV, FLU A&B, COVID)  RVPGX2
Influenza A by PCR: NEGATIVE
Influenza B by PCR: NEGATIVE
Resp Syncytial Virus by PCR: NEGATIVE
SARS Coronavirus 2 by RT PCR: NEGATIVE

## 2024-08-24 LAB — CK: Total CK: 52 U/L (ref 38–234)

## 2024-08-24 LAB — COMPREHENSIVE METABOLIC PANEL WITH GFR
ALT: 6 U/L (ref 0–44)
AST: 16 U/L (ref 15–41)
Albumin: 3.2 g/dL — ABNORMAL LOW (ref 3.5–5.0)
Alkaline Phosphatase: 67 U/L (ref 38–126)
Anion gap: 16 — ABNORMAL HIGH (ref 5–15)
BUN: 13 mg/dL (ref 8–23)
CO2: 14 mmol/L — ABNORMAL LOW (ref 22–32)
Calcium: 7.7 mg/dL — ABNORMAL LOW (ref 8.9–10.3)
Chloride: 112 mmol/L — ABNORMAL HIGH (ref 98–111)
Creatinine, Ser: 0.78 mg/dL (ref 0.44–1.00)
GFR, Estimated: >60 mL/min (ref >60)
Glucose, Bld: 73 mg/dL (ref 70–99)
Potassium: 3.5 mmol/L (ref 3.5–5.1)
Sodium: 142 mmol/L (ref 135–145)
Total Bilirubin: 0.6 mg/dL (ref 0.0–1.2)
Total Protein: 5.7 g/dL — ABNORMAL LOW (ref 6.5–8.1)

## 2024-08-24 NOTE — Discharge Instructions (Signed)
 The blood work and imaging that were done today were normal.  Continue to take all medications as prescribed.  Follow-up with your PCP.  Return to the emergency department as needed.

## 2024-08-24 NOTE — ED Provider Notes (Signed)
 [place] EMERGENCY DEPARTMENT AT [facility] Provider Note  CSN: [id number] Arrival date & time: 08/24/24 1714  Chief Complaint(s) Hypotension  HPI Katelyn Sparks is a 89 y.o. female who is here today after she is brought in by EMS.  Patient was found outside at home.  A neighbor reportedly saw her laying on the front porch on the ground.  Unclear how long she had been on the ground for.  Patient was initially hypotensive for EMS.  Patient does have a history of dementia, is more confused today than she is usually.   Past Medical History Past Medical History:  Diagnosis Date   Blood in stool    Closed dislocation of left shoulder 03/2019   Dementia (HCC)    Esophageal reflux    Flatulence, eructation, and gas pain    Osteoarthrosis, unspecified whether generalized or localized, unspecified site    Unspecified hemorrhoids without mention of complication    Patient Active Problem List   Diagnosis Date Noted   Acute cystitis without hematuria 07/26/2024   Encounter for examination following treatment at hospital 07/26/2024   Abnormal urine findings 12/29/2023   Insomnia 12/29/2023   Hospital discharge follow-up 09/01/2023   Hypokalemia 09/01/2023   HOCM (hypertrophic obstructive cardiomyopathy) (HCC) 08/26/2023   Syncope 08/26/2023   Syncope and collapse 08/23/2023   Sundowning 12/10/2019   Agitation due to dementia (HCC) 12/10/2019   Rash of both hands 12/10/2019   Pre-diabetes 10/02/2019   Memory changes 10/02/2019   Anxiety 10/02/2019   Toenail fungus 10/02/2019   History of recent fall 04/04/2019   Insomnia due to medical condition 08/20/2014   Dementia (HCC) 08/20/2014   Chronic constipation 11/12/2013   Hyperglycemia 08/28/2013   Hyperlipidemia with target LDL less than 130 08/28/2013   Other screening mammogram 08/28/2013   Osteopenia 08/28/2013   Routine general medical examination at a health care facility 08/28/2013   GERD without esophagitis  07/26/2010   Home Medication(s) Prior to Admission medications   Medication Sig Start Date End Date Taking? Authorizing Provider  memantine  (NAMENDA  XR) 28 MG CP24 24 hr capsule Take 1 capsule (28 mg total) by mouth daily. 12/29/23  Yes Paseda, Folashade R, FNP  metoprolol  succinate (TOPROL  XL) 25 MG 24 hr tablet Take 1 tablet (25 mg total) by mouth daily. 12/29/23  Yes Paseda, Folashade R, FNP  polyethylene glycol (MIRALAX ) 17 g packet Take 17 g by mouth daily as needed. 12/03/23  Yes Bernard Drivers, MD  traZODone  (DESYREL ) 50 MG tablet Take 1 tablet (50 mg total) by mouth at bedtime. 12/29/23  Yes Paseda, Folashade R, FNP  Blood Glucose Monitoring Suppl DEVI 1 each by Does not apply route in the morning and at bedtime. May substitute to any manufacturer covered by patient's insurance. 07/03/24   Oley Bascom RAMAN, NP  docusate sodium  (COLACE) 100 MG capsule Take 1 capsule (100 mg total) by mouth every 12 (twelve) hours. Patient not taking: Reported on 08/24/2024 12/03/23   Steinl, Kevin, MD  pantoprazole  (PROTONIX ) 20 MG tablet Take 1 tablet (20 mg total) by mouth daily. 08/17/24   Randol Simmonds, MD  omeprazole  (PRILOSEC) 40 MG capsule Take 1 capsule (40 mg total) by mouth daily. Patient not taking: Reported on 12/10/2019 10/17/18 05/31/20  Stroud, Natalie M, FNP  Past Surgical History Past Surgical History:  Procedure Laterality Date   ABDOMINAL HYSTERECTOMY     COLONOSCOPY  11/2010   UPPER GI ENDOSCOPY  11/2010   Family History Family History  Problem Relation Age of Onset   Hypertension Mother    Diabetes Mother    Diabetes Sister    Diabetes Brother    Lung cancer Son    Diabetes Other    Colon cancer Neg Hx    Cancer Neg Hx    Early death Neg Hx    Heart disease Neg Hx    Hyperlipidemia Neg Hx    Kidney disease Neg Hx    Learning disabilities Neg Hx     Stroke Neg Hx     Social History Social History   Tobacco Use   Smoking status: Never   Smokeless tobacco: Never  Vaping Use   Vaping status: Never Used  Substance Use Topics   Alcohol use: No    Alcohol/week: 0.0 standard drinks of alcohol   Drug use: No   Allergies Patient has no known allergies.  Review of Systems Review of Systems  Physical Exam Vital Signs  I have reviewed the triage vital signs BP 131/69   Pulse (!) 55   Temp 97.6 F (36.4 C) (Oral)   Resp 15   SpO2 100%   Physical Exam Vitals and nursing note reviewed.  HENT:     Head: Normocephalic and atraumatic.  Eyes:     Pupils: Pupils are equal, round, and reactive to light.  Pulmonary:     Effort: Pulmonary effort is normal.  Abdominal:     General: Abdomen is flat. There is no distension.     Palpations: Abdomen is soft.  Musculoskeletal:        General: No swelling or deformity. Normal range of motion.     Cervical back: Normal range of motion.  Neurological:     Mental Status: She is alert. Mental status is at baseline.     ED Results and Treatments Labs (all labs ordered are listed, but only abnormal results are displayed) Labs Reviewed  CBC WITH DIFFERENTIAL/PLATELET - Abnormal; Notable for the following components:      Result Value   HCT 35.7 (*)    All other components within normal limits  COMPREHENSIVE METABOLIC PANEL WITH GFR - Abnormal; Notable for the following components:   Chloride 112 (*)    CO2 14 (*)    Calcium  7.7 (*)    Total Protein 5.7 (*)    Albumin 3.2 (*)    Anion gap 16 (*)    All other components within normal limits  RESP PANEL BY RT-PCR (RSV, FLU A&B, COVID)  RVPGX2  CK  URINALYSIS, W/ REFLEX TO CULTURE (INFECTION SUSPECTED)                                                                                                                          Radiology CT ABDOMEN PELVIS WO  CONTRAST Result Date: 08/24/2024 CLINICAL DATA:  Small bowel obstruction  suspected. EXAM: CT ABDOMEN AND PELVIS WITHOUT CONTRAST TECHNIQUE: Multidetector CT imaging of the abdomen and pelvis was performed following the standard protocol without IV contrast. RADIATION DOSE REDUCTION: This exam was performed according to the departmental dose-optimization program which includes automated exposure control, adjustment of the mA and/or kV according to patient size and/or use of iterative reconstruction technique. COMPARISON:  None Available. FINDINGS: Evaluation of this exam is limited in the absence of intravenous contrast of the respiratory motion. Lower chest: The visualized lung bases are clear. No intra-abdominal free air or free fluid. Hepatobiliary: The liver is unremarkable. No biliary dilatation. The gallbladder is unremarkable Pancreas: Unremarkable. No pancreatic ductal dilatation or surrounding inflammatory changes. Spleen: Normal in size without focal abnormality. Adrenals/Urinary Tract: The adrenal glands are unremarkable. There is no hydronephrosis or obstructing stone on either side. Renal vascular calcifications noted. Possible tiny nonobstructing left renal upper pole calculus. The visualized ureters and urinary bladder appear unremarkable. Stomach/Bowel: There is sigmoid diverticulosis. There is moderate stool throughout the colon. There is no bowel obstruction or active inflammation. The appendix is not visualized with certainty. No inflammatory changes identified in the right lower quadrant. Vascular/Lymphatic: Mild aortoiliac atherosclerotic disease. The IVC is unremarkable. No portal venous gas. There is no adenopathy. Reproductive: Hysterectomy.  No suspicious adnexal masses. Other: None Musculoskeletal: Osteopenia with degenerative changes. No acute osseous pathology. IMPRESSION: 1. No acute intra-abdominal or pelvic pathology. No bowel obstruction. 2. Sigmoid diverticulosis. 3.  Aortic Atherosclerosis (ICD10-I70.0). Electronically Signed   By: Vanetta Chou M.D.    On: 08/24/2024 19:15   CT Head Wo Contrast Result Date: 08/24/2024 CLINICAL DATA:  Delirium EXAM: CT HEAD WITHOUT CONTRAST TECHNIQUE: Contiguous axial images were obtained from the base of the skull through the vertex without intravenous contrast. RADIATION DOSE REDUCTION: This exam was performed according to the departmental dose-optimization program which includes automated exposure control, adjustment of the mA and/or kV according to patient size and/or use of iterative reconstruction technique. COMPARISON:  08/24/2023 FINDINGS: Brain: Stable hypodensities throughout the periventricular white matter consistent with chronic small vessel ischemic changes. No evidence of acute infarct or hemorrhage. Lateral ventricles and midline structures are unremarkable. No acute extra-axial fluid collections. No mass effect. Vascular: No hyperdense vessel or unexpected calcification. Stable atherosclerosis. Skull: Normal. Negative for fracture or focal lesion. Sinuses/Orbits: No acute finding. Other: None. IMPRESSION: 1. No acute intracranial process. Electronically Signed   By: Ozell Daring M.D.   On: 08/24/2024 19:07   DG Chest Portable 1 View Result Date: 08/24/2024 CLINICAL DATA:  Found down, cough EXAM: PORTABLE CHEST 1 VIEW COMPARISON:  12/03/2023 FINDINGS: Single frontal view of the chest demonstrates an unremarkable cardiac silhouette. No acute airspace disease, effusion, or pneumothorax. No acute bony abnormalities. IMPRESSION: 1. No acute intrathoracic process. Electronically Signed   By: Ozell Daring M.D.   On: 08/24/2024 18:09    Pertinent labs & imaging results that were available during my care of the patient were reviewed by me and considered in my medical decision making (see MDM for details).  Medications Ordered in ED Medications - No data to display  Procedures Procedures  (including critical care time)  Medical Decision Making / ED Course   This patient presents to the ED for concern of being found down, this involves an extensive number of treatment options, and is a complaint that carries with it a high risk of complications and morbidity.  The differential diagnosis includes rhabdomyolysis, intracranial hemorrhage, dehydration, electrolyte abnormalities, bowel obstruction, UTI, dementia.  MDM: Patient not appear to be in acute distress.  He is confused, which is her baseline.  Patient seen here just over 1 week ago for concern for UTI, which had not been the case.  There is reported the patient exhibiting some discomfort after eating.  Will obtain imaging of patient's abdomen.  Blood work ordered, CK ordered.  Will image patient's head.  Reassessment 10:35 PM-patient's head CT negative for acute process.  CT abdomen pelvis also negative.  Blood pressure has been normal here in the ED.  CK not elevated.  Patient son has arrived.  States patient is back at her baseline.  He is planning on taking her to the beach this weekend.  He has no concerns about being able to manage her safety at home.  Will discharge patient.  Additional history obtained: -Additional history obtained from son at bedside -External records from outside source obtained and reviewed including: Chart review including previous notes, labs, imaging, consultation notes   Lab Tests: -I ordered, reviewed, and interpreted labs.   The pertinent results include:   Labs Reviewed  CBC WITH DIFFERENTIAL/PLATELET - Abnormal; Notable for the following components:      Result Value   HCT 35.7 (*)    All other components within normal limits  COMPREHENSIVE METABOLIC PANEL WITH GFR - Abnormal; Notable for the following components:   Chloride 112 (*)    CO2 14 (*)    Calcium  7.7 (*)    Total Protein 5.7 (*)    Albumin 3.2 (*)    Anion gap 16 (*)    All other components  within normal limits  RESP PANEL BY RT-PCR (RSV, FLU A&B, COVID)  RVPGX2  CK  URINALYSIS, W/ REFLEX TO CULTURE (INFECTION SUSPECTED)      EKG no ST segment depressions or elevations, no evidence of acute ischemia.  EKG Interpretation Date/Time:  Saturday August 24 2024 17:42:30 EDT Ventricular Rate:  52 PR Interval:  216 QRS Duration:  90 QT Interval:  503 QTC Calculation: 468 R Axis:   -52  Text Interpretation: Sinus rhythm Borderline prolonged PR interval Left anterior fascicular block Confirmed by Mannie Pac [id number]) on 08/24/2024 10:35:51 PM         Imaging Studies ordered: I ordered imaging studies including CT head, CT abdomen pelvis I independently visualized and interpreted imaging. I agree with the radiologist interpretation   Medicines ordered and prescription drug management: No orders of the defined types were placed in this encounter.   -I have reviewed the patients home medicines and have made adjustments as needed  Cardiac Monitoring: The patient was maintained on a cardiac monitor.  I personally viewed and interpreted the cardiac monitored which showed an underlying rhythm of: Normal sinus rhythm  Social Determinants of Health:  Factors impacting patients care include: Lack of access to primary care   Reevaluation: After the interventions noted above, I reevaluated the patient and found that they have :improved  Co morbidities that complicate the patient evaluation  Past Medical History:  Diagnosis Date   Blood in stool    Closed dislocation of left shoulder  03/2019   Dementia (HCC)    Esophageal reflux    Flatulence, eructation, and gas pain    Osteoarthrosis, unspecified whether generalized or localized, unspecified site    Unspecified hemorrhoids without mention of complication       Dispostion: I considered admission for this patient, however with the reassuring workup, no safe disposition, she is appropriate for  discharge.     Final Clinical Impression(s) / ED Diagnoses Final diagnoses:  Fall, initial encounter     @PCDICTATION @    Mannie Pac T, DO 08/24/24 2237

## 2024-08-24 NOTE — ED Notes (Signed)
 Pt assisted to bathroom and out to son's POV via wheelchair.

## 2024-08-24 NOTE — ED Triage Notes (Signed)
 Pt BIB EMS from outside at home. Pt was found by a neighbor just laying on the front porch on the ground. Per EMS and visitor, they are unaware if she fell or the duration of how long she's been on the ground. Pt was sweaty upon arrival and hypotensive. Per EMS pt did not take her blood pressure medications today and her first pressure showed 92/40. Per visitor, pt is more confused than normal.   Per family, she's more confused than normal. Pt is not o nblood thinners and she was given 500ml NS  Hx dementia and hypertension  18g L Forearm

## 2024-09-01 NOTE — Patient Instructions (Signed)
 Visit Information  Thank you for taking time to visit with me today. Please don't hesitate to contact me if I can be of assistance to you before our next scheduled appointment.  Our next appointment is by telephone on 10/10 at 9 AM Please call the care guide team at [phone] if you need to cancel or reschedule your appointment.   Following is a copy of your care plan:   Goals Addressed             This Visit's Progress    LCSW VBCI Social Work Care Plan   On track    Problems:   Care Coordination needs related to Dementia: Dementia with anxiety  CSW Clinical Goal(s):   Over the next 90 days the Patient will attend all scheduled medical appointments as evidenced by patient report and care team review of appointment completion in electronic MEDICAL RECORD NUMBER  demonstrate a reduction in symptoms related to Caregiver Stress .  Interventions:  Dementia Care:   Current level of care: Home with other family or significant other(s): family member: Adult Psychologist, forensic of patient safety in current living environment and review of Dementia resources and support (Pt resides with adult grandson and assists with ADLs) ADL's Assessed needs, level of care concerns, how currently meeting needs and barriers to care Family are paying for an independent aid to visit the home daily to assist with me Facility  Patient and family aren't interested  Active listening / Reflection utilized Caregiver stress acknowledged :  Consideration on in-home help encouraged : options discussed Discussed caregiver resources and support:   Emotional Support Provided Mindfulness or Relaxation training provided Provided general psycho-education for mental health needs  Patient Goals/Self-Care Activities:  Continue taking your medication as prescribed.   Increase coping skills, healthy habits, and stress reduction  Plan:   Telephone follow up appointment with care management team member scheduled  for:  2-4 weeks        Please call the Suicide and Crisis Lifeline: 988 go to [facility] Urgent [facility] [address] [place] [phone]) call 911 if you are experiencing a Mental Health or Behavioral Health Crisis or need someone to talk to.  The patient verbalized understanding of instructions, educational materials, and care plan provided today and DECLINED offer to receive copy of patient instructions, educational materials, and care plan.   Rolin Ezzard HUGHS [facility]  [facility], [facility] Clinical Social Worker Direct Dial: [phone]  Fax: [phone] Website: [email or web address] 10:47 AM

## 2024-09-01 NOTE — Patient Outreach (Signed)
 Complex Care Management   Visit Note  08/22/2024  Name:  Katelyn Sparks MRN: [id number] DOB: 09/05/1935  Situation: Referral received for Complex Care Management related to Dementia I obtained verbal consent from Caregiver.  Visit completed with Caregiver  on the phone  Background:   Past Medical History:  Diagnosis Date   Blood in stool    Closed dislocation of left shoulder 03/2019   Dementia (HCC)    Esophageal reflux    Flatulence, eructation, and gas pain    Osteoarthrosis, unspecified whether generalized or localized, unspecified site    Unspecified hemorrhoids without mention of complication     Assessment: Patient Reported Symptoms:  Cognitive Cognitive Status: No symptoms reported, Able to follow simple commands, Normal speech and language skills Cognitive/Intellectual Conditions Management [RPT]: None reported or documented in medical history or problem list   Health Maintenance Behaviors: Annual physical exam  Neurological Neurological Review of Symptoms: No symptoms reported Neurological Management Strategies: Coping strategies, Medication therapy  HEENT HEENT Symptoms Reported: No symptoms reported      Cardiovascular Cardiovascular Symptoms Reported: No symptoms reported    Respiratory Respiratory Symptoms Reported: No symptoms reported    Endocrine Endocrine Symptoms Reported: No symptoms reported Is patient diabetic?: Yes Is patient checking blood sugars at home?: Yes    Gastrointestinal Gastrointestinal Symptoms Reported: No symptoms reported      Genitourinary Genitourinary Symptoms Reported: Malodorous urine Additional Genitourinary Details: Caregiver reports hx of UTIs Genitourinary Management Strategies: Coping strategies, Medication therapy  Integumentary Integumentary Symptoms Reported: No symptoms reported    Musculoskeletal Musculoskelatal Symptoms Reviewed: Unsteady gait Musculoskeletal Management Strategies: Coping strategies, Routine  screening Falls in the past year?: No Number of falls in past year: 1 or less Was there an injury with Fall?: No Fall Risk Category Calculator: 0 Patient Fall Risk Level: Low Fall Risk    Psychosocial Psychosocial Symptoms Reported: Alteration in sleep habits, Report of significant loss, deaths, abandonment, traumatic incidents Behavioral Management Strategies: Adequate rest, Support system, Medication therapy, Coping strategies Major Change/Loss/Stressor/Fears (CP): Death of a loved one, Medical condition, self Techniques to Cope with Loss/Stress/Change: Diversional activities, Medication Quality of Family Relationships: involved, supportive, helpful Do you feel physically threatened by others?: No    09/01/2024    PHQ2-9 Depression Screening   Little interest or pleasure in doing things    Feeling down, depressed, or hopeless    PHQ-2 - Total Score    Trouble falling or staying asleep, or sleeping too much    Feeling tired or having little energy    Poor appetite or overeating     Feeling bad about yourself - or that you are a failure or have let yourself or your family down    Trouble concentrating on things, such as reading the newspaper or watching television    Moving or speaking so slowly that other people could have noticed.  Or the opposite - being so fidgety or restless that you have been moving around a lot more than usual    Thoughts that you would be better off dead, or hurting yourself in some way    PHQ2-9 Total Score    If you checked off any problems, how difficult have these problems made it for you to do your work, take care of things at home, or get along with other people    Depression Interventions/Treatment      There were no vitals filed for this visit.  Medications Reviewed Today     Reviewed by  Katelyn Celani Sparks, Katelyn Sparks (Social Worker) on 08/22/24 at 1010  Med List Status: <None>   Medication Order Taking? Sig Documenting Provider Last Dose Status Informant   Blood Glucose Monitoring Suppl DEVI [id number] Yes 1 each by Does not apply route in the morning and at bedtime. May substitute to any manufacturer covered by patient's insurance. Oley Bascom RAMAN, NP  Active   docusate sodium  (COLACE) 100 MG capsule [id number] Yes Take 1 capsule (100 mg total) by mouth every 12 (twelve) hours. Steinl, Kevin, MD  Active   Glucose Blood (BLOOD GLUCOSE TEST STRIPS) STRP [id number] Yes 1 each by In Vitro route 2 (two) times daily. May substitute to any manufacturer covered by patient's insurance. Oley Bascom RAMAN, NP  Active   memantine  (NAMENDA  XR) 28 MG CP24 24 hr capsule [id number] Yes Take 1 capsule (28 mg total) by mouth daily. Paseda, Folashade R, FNP  Active   metoprolol  succinate (TOPROL  XL) 25 MG 24 hr tablet [id number] Yes Take 1 tablet (25 mg total) by mouth daily. Paseda, Folashade R, FNP  Active    Patient not taking:   Discontinued 05/31/20 1550 pantoprazole  (PROTONIX ) 20 MG tablet [id number] Yes Take 1 tablet (20 mg total) by mouth daily. Randol Simmonds, MD  Active   polyethylene glycol (MIRALAX ) 17 g packet [id number] Yes Take 17 g by mouth daily as needed. Steinl, Kevin, MD  Active   traZODone  (DESYREL ) 50 MG tablet [id number] Yes Take 1 tablet (50 mg total) by mouth at bedtime. Paseda, Folashade R, FNP  Active   Med List Note Marisa Nathanel SAILOR, CPhT 03/30/19 2142): Cedric Chavers (Son who lives with the patient) [phone]            Recommendation:   Continue Current Plan of Care  Follow Up Plan:   Telephone follow-up in 1 month  Rolin Kerns, Katelyn Sparks [facility]  [facility], [facility] Clinical Social Worker Direct Dial: [phone]  Fax: [phone] Website: [email or web address] 10:45 AM

## 2024-09-05 ENCOUNTER — Telehealth: Admitting: Licensed Clinical Social Worker

## 2024-09-06 ENCOUNTER — Telehealth: Payer: Self-pay | Admitting: Nurse Practitioner

## 2024-09-06 NOTE — Telephone Encounter (Signed)
 Copied from CRM [id number]. Topic: Referral - Question >> Sep 05, 2024  3:43 PM Antony RAMAN wrote: Reason for CRM: kevin the pts son is requesting a referral for [facility] for dementia

## 2024-09-09 ENCOUNTER — Other Ambulatory Visit: Payer: Self-pay | Admitting: Nurse Practitioner

## 2024-09-09 DIAGNOSIS — F0390 Unspecified dementia without behavioral disturbance: Secondary | ICD-10-CM

## 2024-09-09 DIAGNOSIS — I421 Obstructive hypertrophic cardiomyopathy: Secondary | ICD-10-CM

## 2024-09-09 DIAGNOSIS — G4701 Insomnia due to medical condition: Secondary | ICD-10-CM

## 2024-09-09 NOTE — Telephone Encounter (Signed)
 Please advise [facility]

## 2024-09-11 ENCOUNTER — Telehealth: Payer: Self-pay | Admitting: Nurse Practitioner

## 2024-09-11 NOTE — Telephone Encounter (Signed)
 Copied from CRM #[id number]. Topic: Referral - Question >> Sep 10, 2024  4:09 PM Joesph NOVAK wrote: Reason for CRM: [facility] Feb. Patients son wants to know if theres anything that can be done to push appointment time? He states she needs to be soon rather than later. Or if another referral can be sent somewhere else?

## 2024-09-11 NOTE — Telephone Encounter (Signed)
 Referral updated as URGENT and note placed in referral requesting sooner appt if available.  Spoke with Franky [facility]) and advised we do not know scheduling time frames for other offices. He is more than welcome to contact outside neuro offices to see when they could see the patient, and he can contact us  to have referral updated.

## 2024-09-11 NOTE — Telephone Encounter (Signed)
 Pt son was advised of referral. Kh

## 2024-09-20 ENCOUNTER — Other Ambulatory Visit: Payer: Self-pay | Admitting: Licensed Clinical Social Worker

## 2024-09-20 NOTE — Patient Instructions (Signed)
 Visit Information  Thank you for taking time to visit with me today. Please don't hesitate to contact me if I can be of assistance to you before our next scheduled appointment.  Your next care management appointment is by telephone on 11/7 at 10 AM  Please call the care guide team at [phone] if you need to cancel, schedule, or reschedule an appointment.   Please call the Suicide and Crisis Lifeline: 988 go to [facility] Urgent [facility] [address] [place] [phone]) call 911 if you are experiencing a Mental Health or Behavioral Health Crisis or need someone to talk to.  Rolin Kerns, LCSW [place]  [facility], [facility] Clinical Social Worker Direct Dial: [phone]  Fax: [phone] Website: [email or web address] 9:33 AM

## 2024-09-20 NOTE — Patient Outreach (Signed)
 Complex Care Management   Visit Note  09/20/2024  Name:  Katelyn Sparks MRN: [id number] DOB: [birth year]  Situation: Referral received for Complex Care Management related to Dementia I obtained verbal consent from Caregiver.  Visit completed with Caregiver  on the phone  Background:   Past Medical History:  Diagnosis Date   Blood in stool    Closed dislocation of left shoulder 03/2019   Dementia (HCC)    Esophageal reflux    Flatulence, eructation, and gas pain    Osteoarthrosis, unspecified whether generalized or localized, unspecified site    Unspecified hemorrhoids without mention of complication     Assessment: Patient Reported Symptoms:  Cognitive Cognitive Status: No symptoms reported, Able to follow simple commands, Normal speech and language skills Cognitive/Intellectual Conditions Management [RPT]: Other Other: Dementia   Health Maintenance Behaviors: Annual physical exam  Neurological Neurological Review of Symptoms: Other: Oher Neurological Symptoms/Conditions [RPT]: Dementia Neurological Management Strategies: Coping strategies, Medication therapy, Routine screening Neurological Comment: Pt has an appt with Neurology Feb 2026. Caregiver agreed to inquire about other offices to see if they have sooner availability to establish services  HEENT HEENT Symptoms Reported: Not assessed      Cardiovascular Cardiovascular Symptoms Reported: Not assessed    Respiratory Respiratory Symptoms Reported: Not assesed    Endocrine Endocrine Symptoms Reported: Not assessed    Gastrointestinal Gastrointestinal Symptoms Reported: Not assessed      Genitourinary Genitourinary Symptoms Reported: No symptoms reported    Integumentary Integumentary Symptoms Reported: Not assessed    Musculoskeletal Musculoskelatal Symptoms Reviewed: Unsteady gait Musculoskeletal Management Strategies: Coping strategies, Routine screening Falls in the past year?: Yes Number of falls in past year:  1 or less Was there an injury with Fall?: No Fall Risk Category Calculator: 1 Patient Fall Risk Level: Low Fall Risk Patient at Risk for Falls Due to: Impaired balance/gait, Mental status change Fall risk Follow up: Falls prevention discussed  Psychosocial Psychosocial Symptoms Reported: Other Other Psychosocial Conditions: Caregiver Stress Behavioral Management Strategies: Coping strategies, Support system, Community resources Major Change/Loss/Stressor/Fears (CP): Medical condition, self Techniques to Cope with Loss/Stress/Change: Diversional activities, Medication      09/20/2024    PHQ2-9 Depression Screening   Little interest or pleasure in doing things    Feeling down, depressed, or hopeless    PHQ-2 - Total Score    Trouble falling or staying asleep, or sleeping too much    Feeling tired or having little energy    Poor appetite or overeating     Feeling bad about yourself - or that you are a failure or have let yourself or your family down    Trouble concentrating on things, such as reading the newspaper or watching television    Moving or speaking so slowly that other people could have noticed.  Or the opposite - being so fidgety or restless that you have been moving around a lot more than usual    Thoughts that you would be better off dead, or hurting yourself in some way    PHQ2-9 Total Score    If you checked off any problems, how difficult have these problems made it for you to do your work, take care of things at home, or get along with other people    Depression Interventions/Treatment      There were no vitals filed for this visit.  Medications Reviewed Today     Reviewed by Ezzard Rolin BIRCH, LCSW (Social Worker) on 09/20/24 at 0913  Med List Status: <  None>   Medication Order Taking? Sig Documenting Provider Last Dose Status Informant  Blood Glucose Monitoring Suppl DEVI [id number]  1 each by Does not apply route in the morning and at bedtime. May substitute to any  manufacturer covered by patient's insurance. Oley Bascom RAMAN, NP  Active Child  docusate sodium  (COLACE) 100 MG capsule [id number] No Take 1 capsule (100 mg total) by mouth every 12 (twelve) hours.  Patient not taking: Reported on 08/24/2024   Bernard Drivers, MD Not Taking Active Child  memantine  (NAMENDA  XR) 28 MG CP24 24 hr capsule [id number] No Take 1 capsule (28 mg total) by mouth daily. Paseda, Folashade R, FNP 08/23/2024 Active Child  metoprolol  succinate (TOPROL -XL) 25 MG 24 hr tablet [id number]  TAKE ONE TABLET BY MOUTH DAILY Nichols, Tonya S, NP  Active   Patient not taking:  Discontinued 05/31/20 1550 pantoprazole  (PROTONIX ) 20 MG tablet [id number] No Take 1 tablet (20 mg total) by mouth daily. Randol Simmonds, MD Unknown Active Child  polyethylene glycol (MIRALAX ) 17 g packet [id number] No Take 17 g by mouth daily as needed. Bernard Drivers, MD 08/23/2024 Active Child  traZODone  (DESYREL ) 50 MG tablet [id number]  TAKE TWO TABLETS BY MOUTH AT BEDTIME Oley Bascom RAMAN, NP  Active   Med List Note Marisa Nathanel SAILOR, CPhT 03/30/19 2142): Cedric Milone (Son who lives with the patient) [phone]            Recommendation:   Continue Current Plan of Care  Follow Up Plan:   Telephone follow-up in 1 month  Rolin Kerns, LCSW [facility]  [facility], [facility] Clinical Social Worker Direct Dial: [phone]  Fax: [phone] Website: [email or web address] 9:32 AM

## 2024-10-18 ENCOUNTER — Other Ambulatory Visit: Payer: Self-pay | Admitting: Licensed Clinical Social Worker

## 2024-10-18 NOTE — Patient Outreach (Signed)
 Complex Care Management   Visit Note  10/18/2024  Name:  DINIA JOYNT MRN: [id number] DOB: 1935-03-02  Situation: Referral received for Complex Care Management related to Dementia I obtained verbal consent from Caregiver.  Visit completed with Caregiver  on the phone  Background:   Past Medical History:  Diagnosis Date   Blood in stool    Closed dislocation of left shoulder 03/2019   Dementia (HCC)    Esophageal reflux    Flatulence, eructation, and gas pain    Osteoarthrosis, unspecified whether generalized or localized, unspecified site    Unspecified hemorrhoids without mention of complication     Assessment: Patient Reported Symptoms:  Cognitive Cognitive Status: No symptoms reported, Able to follow simple commands Cognitive/Intellectual Conditions Management [RPT]: Other Other: Dementia   Health Maintenance Behaviors: Annual physical exam  Neurological Neurological Review of Symptoms: Other: Oher Neurological Symptoms/Conditions [RPT]: Dementia Neurological Management Strategies: Coping strategies  HEENT HEENT Symptoms Reported: No symptoms reported      Cardiovascular Cardiovascular Symptoms Reported: No symptoms reported    Respiratory Respiratory Symptoms Reported: No symptoms reported    Endocrine Endocrine Symptoms Reported: No symptoms reported Is patient diabetic?: Yes Is patient checking blood sugars at home?: Yes    Gastrointestinal Gastrointestinal Symptoms Reported: No symptoms reported      Genitourinary Genitourinary Symptoms Reported: No symptoms reported    Integumentary Integumentary Symptoms Reported: No symptoms reported    Musculoskeletal Musculoskelatal Symptoms Reviewed: Unsteady gait Musculoskeletal Management Strategies: Coping strategies, Routine screening Musculoskeletal Comment: Patient has been doing well with no falls since Sept 2025      Psychosocial Psychosocial Symptoms Reported: No symptoms reported          10/18/2024     PHQ2-9 Depression Screening   Little interest or pleasure in doing things    Feeling down, depressed, or hopeless    PHQ-2 - Total Score    Trouble falling or staying asleep, or sleeping too much    Feeling tired or having little energy    Poor appetite or overeating     Feeling bad about yourself - or that you are a failure or have let yourself or your family down    Trouble concentrating on things, such as reading the newspaper or watching television    Moving or speaking so slowly that other people could have noticed.  Or the opposite - being so fidgety or restless that you have been moving around a lot more than usual    Thoughts that you would be better off dead, or hurting yourself in some way    PHQ2-9 Total Score    If you checked off any problems, how difficult have these problems made it for you to do your work, take care of things at home, or get along with other people    Depression Interventions/Treatment      There were no vitals filed for this visit.  Medications Reviewed Today   Medications were not reviewed in this encounter     Recommendation:   Continue Current Plan of Care  Follow Up Plan:   Telephone follow-up 4-6 weeks  Rolin Kerns, LCSW [place]  [facility], [facility] Clinical Social Worker Direct Dial: [phone]  Fax: [phone] Website: [email or web address] 10:47 AM

## 2024-10-18 NOTE — Patient Instructions (Signed)
 Visit Information  Thank you for taking time to visit with me today. Please don't hesitate to contact me if I can be of assistance to you before our next scheduled appointment.  Your next care management appointment is by telephone on 12/19 at 10 AM  Please call the care guide team at [phone] if you need to cancel, schedule, or reschedule an appointment.   Please call the Suicide and Crisis Lifeline: 988 go to [facility] Urgent [facility] [address] [place] [phone]) call 911 if you are experiencing a Mental Health or Behavioral Health Crisis or need someone to talk to.  Rolin Kerns, LCSW [place]  [facility], [facility] Clinical Social Worker Direct Dial: [phone]  Fax: [phone] Website: [email or web address] 10:48 AM

## 2024-11-29 ENCOUNTER — Other Ambulatory Visit: Admitting: Licensed Clinical Social Worker

## 2024-11-29 NOTE — Patient Instructions (Signed)
 Visit Information  Thank you for taking time to visit with me today. Please don't hesitate to contact me if I can be of assistance to you before our next scheduled appointment.  Your next care management appointment is by telephone on 1/30 at 10 AM  Please call the care guide team at [phone] if you need to cancel, schedule, or reschedule an appointment.   Please call the Suicide and Crisis Lifeline: 988 go to [facility] Urgent [facility] [address] [place] [phone]) call 911 if you are experiencing a Mental Health or Behavioral Health Crisis or need someone to talk to.  Rolin Kerns, LCSW [place]  [facility], [facility] Clinical Social Worker Direct Dial: [phone]  Fax: [phone] Website: [email or web address] 1:21 PM

## 2024-12-10 ENCOUNTER — Telehealth: Payer: Self-pay | Admitting: Nurse Practitioner

## 2024-12-10 NOTE — Telephone Encounter (Signed)
 Pt scheduled with [facility] 02/03/2025 Copied from CRM #[id number]. Topic: Referral - Question >> Sep 05, 2024  3:43 PM Antony RAMAN wrote: Reason for CRM: kevin the pts son is requesting a referral for [facility] for dementia >> Sep 10, 2024  9:29 AM Darshell M wrote: Patient's son, Franky calling in to check status on referral to neuro. Advised referral is pending. Franky [phone]

## 2024-12-30 ENCOUNTER — Other Ambulatory Visit: Payer: Self-pay | Admitting: Nurse Practitioner

## 2024-12-30 DIAGNOSIS — F0390 Unspecified dementia without behavioral disturbance: Secondary | ICD-10-CM

## 2024-12-30 NOTE — Telephone Encounter (Signed)
 Done [facility]

## 2025-01-03 ENCOUNTER — Encounter: Payer: Self-pay | Admitting: Nurse Practitioner

## 2025-01-03 ENCOUNTER — Ambulatory Visit (INDEPENDENT_AMBULATORY_CARE_PROVIDER_SITE_OTHER): Admitting: Nurse Practitioner

## 2025-01-03 VITALS — BP 137/81 | HR 55 | Temp 95.9°F | Wt 106.3 lb

## 2025-01-03 DIAGNOSIS — H6123 Impacted cerumen, bilateral: Secondary | ICD-10-CM | POA: Diagnosis not present

## 2025-01-03 NOTE — Progress Notes (Signed)
 "  Subjective   Patient ID: Katelyn Sparks, female    DOB: [birth year], 89 y.o.   MRN: [id number]  Chief Complaint  Patient presents with   Follow-up    Believe hearing loss have used peroxide about 4 days ago.     Referring provider: Oley Bascom RAMAN, NP  ELLY HAFFEY is a 89 y.o. female with Past Medical History: No date: Blood in stool 03/2019: Closed dislocation of left shoulder No date: Dementia (HCC) No date: Esophageal reflux No date: Flatulence, eructation, and gas pain No date: Osteoarthrosis, unspecified whether generalized or localized,  unspecified site No date: Unspecified hemorrhoids without mention of complication  HPI  Patient presents for acute visit for decreased hearing.  Patient does have impacted cerumen bilaterally upon exam.  Ear wash performed in office today.  Patient's family was advised to use Debrox drops with her. Denies f/c/s, n/v/d, hemoptysis, PND, leg swelling Denies chest pain or edema    Allergies[1]  Immunization History  Administered Date(s) Administered   INFLUENZA, HIGH DOSE SEASONAL PF 09/11/2016, 08/31/2019, 08/31/2019   Influenza Split 08/23/2012   Influenza Whole 08/29/2010   Influenza,inj,Quad PF,6+ Mos 08/28/2013, 08/20/2014, 11/10/2015, 10/17/2018, 08/20/2021   Influenza-Unspecified 09/11/2019   PFIZER(Purple Top)SARS-COV-2 Vaccination 02/09/2020, 03/03/2020   Pneumococcal Conjugate-13 08/28/2013   Pneumococcal Polysaccharide-23 04/13/2015   Tdap 08/28/2013    Tobacco History: Tobacco Use History[2] Counseling given: Not Answered   Outpatient Encounter Medications as of 01/03/2025  Medication Sig   memantine  (NAMENDA  XR) 28 MG CP24 24 hr capsule TAKE ONE CAPSULE BY MOUTH DAILY   metoprolol  succinate (TOPROL -XL) 25 MG 24 hr tablet TAKE ONE TABLET BY MOUTH DAILY   traZODone  (DESYREL ) 50 MG tablet TAKE TWO TABLETS BY MOUTH AT BEDTIME   Blood Glucose Monitoring Suppl DEVI 1 each by Does not apply route in the morning and  at bedtime. May substitute to any manufacturer covered by patient's insurance. (Patient not taking: Reported on 01/03/2025)   docusate sodium  (COLACE) 100 MG capsule Take 1 capsule (100 mg total) by mouth every 12 (twelve) hours. (Patient not taking: Reported on 01/03/2025)   pantoprazole  (PROTONIX ) 20 MG tablet Take 1 tablet (20 mg total) by mouth daily. (Patient not taking: Reported on 01/03/2025)   polyethylene glycol (MIRALAX ) 17 g packet Take 17 g by mouth daily as needed. (Patient not taking: Reported on 01/03/2025)   [DISCONTINUED] omeprazole  (PRILOSEC) 40 MG capsule Take 1 capsule (40 mg total) by mouth daily. (Patient not taking: Reported on 12/10/2019)   No facility-administered encounter medications on file as of 01/03/2025.    Review of Systems  Review of Systems  Constitutional: Negative.   HENT: Negative.    Cardiovascular: Negative.   Gastrointestinal: Negative.   Allergic/Immunologic: Negative.   Neurological: Negative.   Psychiatric/Behavioral: Negative.       Objective:   BP 137/81   Pulse (!) 55   Temp (!) 95.9 F (35.5 C) (Temporal)   Wt 106 lb 4.8 oz (48.2 kg)   SpO2 94%   BMI 18.25 kg/m   Wt Readings from Last 5 Encounters:  01/03/25 106 lb 4.8 oz (48.2 kg)  08/17/24 107 lb (48.5 kg)  08/16/24 107 lb (48.5 kg)  07/26/24 108 lb (49 kg)  07/03/24 108 lb 9.6 oz (49.3 kg)     Physical Exam Vitals and nursing note reviewed.  Constitutional:      General: She is not in acute distress.    Appearance: She is well-developed.  HENT:  Right Ear: There is impacted cerumen.     Left Ear: There is impacted cerumen.  Cardiovascular:     Rate and Rhythm: Normal rate and regular rhythm.  Pulmonary:     Effort: Pulmonary effort is normal.     Breath sounds: Normal breath sounds.  Neurological:     Mental Status: She is alert and oriented to person, place, and time.       Assessment & Plan:   Bilateral impacted cerumen [H61.23]   Ear wash performed in  office today  Return if symptoms worsen or fail to improve.   Bascom GORMAN Borer, NP 01/03/2025     [1] No Known Allergies [2]  Social History Tobacco Use  Smoking Status Never  Smokeless Tobacco Never   "

## 2025-01-03 NOTE — Patient Instructions (Signed)
Ear Irrigation Ear irrigation is a procedure to wash dirt and wax out of your ear canal. It's also called lavage. You may need this if you're having trouble hearing because of wax in your ear. You may also have it done as part of the treatment for an ear infection.  Getting wax and dirt out of your ear can help ear drops work better. Tell a health care provider about: Any allergies you have. All medicines you're taking, including vitamins, herbs, eye drops, creams, and over-the-counter medicines. Any problems you or family members have had with anesthesia. Any bleeding problems you have. Any surgeries you've had. This includes any ear surgeries. Any medical conditions you have, such as any problems with your ear. Whether you're pregnant or may be pregnant. What are the risks? Your health care provider will talk with you about risks. These may include: Infection. Pain. Loss of hearing. Fluid and debris being pushed into your middle ear. This can happen if there are holes in your eardrum. The procedure not working. Trauma to your ear. Feeling dizzy, light-headed, or nauseous. What happens before the procedure? You'll talk with your provider about the procedure and plan. You may be given ear drops to put in your ear 15-20 minutes before the procedure. This helps loosen the wax. What happens during the procedure?  A syringe will be filled with water or a saline solution. Saline is made of salt and water. The syringe will be gently put into your ear. The fluid will be used to wash out wax and other debris. The procedure may vary among providers and hospitals. What can I expect after the procedure? Follow the instructions given to you by your provider. Follow these instructions at home: Using ear irrigation kits In some cases, you can use an ear irrigation kit at home. Ask your provider if this is an option for you. Use the kit only as told by your provider. Read the instructions on the  package. Follow the directions for using the syringe. Use water that's at room temperature. Do not use an ear irrigation kit if: You have diabetes. This can make you more likely to get an infection. You have a hole or tear in your eardrum. You have tubes in your ears. You've had ear surgery before. You've been told not to irrigate your ears. Cleaning your ears  Clean the outside of your ear with a soft washcloth each day. If told by your provider, use a few drops of baby oil, mineral oil, glycerin, hydrogen peroxide, or earwax softening drops. Do not use cotton swabs to clean your ears. These can push wax down into the ear canal. Do not put things into your ears to try to get rid of wax. This includes ear candles. General instructions Take over-the-counter and prescription medicines only as told by your provider. If you were prescribed antibiotics, use them as told by your provider. Do not stop using the antibiotic even if you start to feel better. Keep your ear clean and dry as told by your provider. See your provider at least once a year to have your ears and hearing checked. Contact a health care provider if: Your hearing isn't getting better. Your hearing is getting worse. You have pain or redness in your ear. You feel dizzy. You have ringing in your ears. You have nausea or vomiting. This information is not intended to replace advice given to you by your health care provider. Make sure you discuss any questions you have with  your health care provider. Document Revised: 02/09/2023 Document Reviewed: 02/09/2023 Elsevier Patient Education  2024 [employer].

## 2025-01-06 ENCOUNTER — Ambulatory Visit: Payer: Self-pay | Admitting: Nurse Practitioner

## 2025-01-09 ENCOUNTER — Ambulatory Visit: Admitting: Nurse Practitioner

## 2025-01-10 ENCOUNTER — Encounter: Payer: Self-pay | Admitting: Licensed Clinical Social Worker

## 2025-01-10 ENCOUNTER — Telehealth: Payer: Self-pay | Admitting: Licensed Clinical Social Worker

## 2025-01-17 NOTE — Patient Instructions (Signed)
 [facility] [employer] - I am sorry I was unable to reach you today for our scheduled appointment. I work with Oley Bascom RAMAN, NP and am calling to support your healthcare needs. Please contact me at [phone] at your earliest convenience. I look forward to speaking with you soon.   Thank you,  Rolin Kerns, LCSW [place]  [facility], [facility] Clinical Social Worker Direct Dial: [phone]  Fax: [phone] Website: [email or web address] 6:48 PM

## 2025-02-03 ENCOUNTER — Ambulatory Visit: Admitting: Neurology

## 2025-02-14 ENCOUNTER — Telehealth: Admitting: Licensed Clinical Social Worker
